# Patient Record
Sex: Male | Born: 1960 | Race: Black or African American | Hispanic: No | Marital: Married | Smoking: Former smoker
Health system: Southern US, Community
[De-identification: ages and names within clinical notes are randomized; demographics above are authoritative.]

## PROBLEM LIST (undated history)

## (undated) DIAGNOSIS — E119 Type 2 diabetes mellitus without complications: Secondary | ICD-10-CM

## (undated) DIAGNOSIS — U071 COVID-19: Secondary | ICD-10-CM

## (undated) DIAGNOSIS — T7840XA Allergy, unspecified, initial encounter: Secondary | ICD-10-CM

## (undated) DIAGNOSIS — Z86010 Personal history of colonic polyps: Secondary | ICD-10-CM

## (undated) DIAGNOSIS — F32A Depression, unspecified: Secondary | ICD-10-CM

## (undated) DIAGNOSIS — S0990XA Unspecified injury of head, initial encounter: Secondary | ICD-10-CM

## (undated) DIAGNOSIS — H16009 Unspecified corneal ulcer, unspecified eye: Secondary | ICD-10-CM

## (undated) DIAGNOSIS — G40909 Epilepsy, unspecified, not intractable, without status epilepticus: Secondary | ICD-10-CM

## (undated) DIAGNOSIS — E785 Hyperlipidemia, unspecified: Secondary | ICD-10-CM

## (undated) DIAGNOSIS — W3400XA Accidental discharge from unspecified firearms or gun, initial encounter: Secondary | ICD-10-CM

## (undated) DIAGNOSIS — B1920 Unspecified viral hepatitis C without hepatic coma: Secondary | ICD-10-CM

## (undated) DIAGNOSIS — F419 Anxiety disorder, unspecified: Secondary | ICD-10-CM

## (undated) DIAGNOSIS — I10 Essential (primary) hypertension: Secondary | ICD-10-CM

## (undated) DIAGNOSIS — F1910 Other psychoactive substance abuse, uncomplicated: Secondary | ICD-10-CM

## (undated) DIAGNOSIS — Z9049 Acquired absence of other specified parts of digestive tract: Secondary | ICD-10-CM

## (undated) HISTORY — DX: Allergy, unspecified, initial encounter: T78.40XA

## (undated) HISTORY — PX: COLONOSCOPY: SHX174

## (undated) HISTORY — DX: COVID-19: U07.1

## (undated) HISTORY — DX: Depression, unspecified: F32.A

## (undated) HISTORY — DX: Hyperlipidemia, unspecified: E78.5

## (undated) HISTORY — DX: Anxiety disorder, unspecified: F41.9

## (undated) HISTORY — DX: Other psychoactive substance abuse, uncomplicated: F19.10

## (undated) HISTORY — DX: Personal history of colonic polyps: Z86.010

## (undated) HISTORY — PX: POLYPECTOMY: SHX149

## (undated) HISTORY — DX: Unspecified corneal ulcer, unspecified eye: H16.009

## (undated) HISTORY — DX: Unspecified viral hepatitis C without hepatic coma: B19.20

## (undated) HISTORY — PX: FINGER SURGERY: SHX640

---

## 1982-04-10 HISTORY — PX: APPENDECTOMY: SHX54

## 1996-04-10 HISTORY — PX: NECK SURGERY: SHX720

## 2002-09-21 ENCOUNTER — Emergency Department (HOSPITAL_COMMUNITY): Admission: EM | Admit: 2002-09-21 | Discharge: 2002-09-21 | Payer: Self-pay | Admitting: Emergency Medicine

## 2002-09-21 ENCOUNTER — Encounter: Payer: Self-pay | Admitting: Emergency Medicine

## 2002-09-29 ENCOUNTER — Encounter: Admission: RE | Admit: 2002-09-29 | Discharge: 2002-09-29 | Payer: Self-pay | Admitting: Occupational Medicine

## 2002-09-29 ENCOUNTER — Encounter: Payer: Self-pay | Admitting: Occupational Medicine

## 2003-07-24 ENCOUNTER — Ambulatory Visit (HOSPITAL_COMMUNITY): Admission: RE | Admit: 2003-07-24 | Discharge: 2003-07-24 | Payer: Self-pay | Admitting: Family Medicine

## 2004-01-23 ENCOUNTER — Emergency Department (HOSPITAL_COMMUNITY): Admission: EM | Admit: 2004-01-23 | Discharge: 2004-01-23 | Payer: Self-pay | Admitting: *Deleted

## 2004-03-28 ENCOUNTER — Emergency Department (HOSPITAL_COMMUNITY): Admission: EM | Admit: 2004-03-28 | Discharge: 2004-03-28 | Payer: Self-pay | Admitting: Emergency Medicine

## 2004-04-16 ENCOUNTER — Emergency Department (HOSPITAL_COMMUNITY): Admission: EM | Admit: 2004-04-16 | Discharge: 2004-04-16 | Payer: Self-pay | Admitting: Emergency Medicine

## 2004-05-09 ENCOUNTER — Emergency Department (HOSPITAL_COMMUNITY): Admission: EM | Admit: 2004-05-09 | Discharge: 2004-05-09 | Payer: Self-pay | Admitting: Emergency Medicine

## 2004-05-13 ENCOUNTER — Encounter: Admission: RE | Admit: 2004-05-13 | Discharge: 2004-05-13 | Payer: Self-pay | Admitting: Emergency Medicine

## 2005-10-07 ENCOUNTER — Emergency Department (HOSPITAL_COMMUNITY): Admission: EM | Admit: 2005-10-07 | Discharge: 2005-10-07 | Payer: Self-pay | Admitting: Emergency Medicine

## 2006-09-29 ENCOUNTER — Emergency Department (HOSPITAL_COMMUNITY): Admission: EM | Admit: 2006-09-29 | Discharge: 2006-09-29 | Payer: Self-pay | Admitting: Emergency Medicine

## 2006-10-19 ENCOUNTER — Encounter: Admission: RE | Admit: 2006-10-19 | Discharge: 2006-10-19 | Payer: Self-pay | Admitting: Emergency Medicine

## 2006-11-01 ENCOUNTER — Encounter: Admission: RE | Admit: 2006-11-01 | Discharge: 2006-11-01 | Payer: Self-pay | Admitting: Emergency Medicine

## 2006-11-20 ENCOUNTER — Encounter: Admission: RE | Admit: 2006-11-20 | Discharge: 2006-11-20 | Payer: Self-pay | Admitting: Emergency Medicine

## 2006-12-28 ENCOUNTER — Encounter: Admission: RE | Admit: 2006-12-28 | Discharge: 2006-12-28 | Payer: Self-pay | Admitting: Neurosurgery

## 2007-01-07 ENCOUNTER — Ambulatory Visit (HOSPITAL_COMMUNITY): Admission: RE | Admit: 2007-01-07 | Discharge: 2007-01-08 | Payer: Self-pay | Admitting: Neurosurgery

## 2007-02-26 ENCOUNTER — Emergency Department (HOSPITAL_COMMUNITY): Admission: EM | Admit: 2007-02-26 | Discharge: 2007-02-26 | Payer: Self-pay | Admitting: Emergency Medicine

## 2007-04-11 HISTORY — PX: BACK SURGERY: SHX140

## 2008-06-09 ENCOUNTER — Ambulatory Visit: Payer: Self-pay | Admitting: Internal Medicine

## 2008-06-10 ENCOUNTER — Encounter (INDEPENDENT_AMBULATORY_CARE_PROVIDER_SITE_OTHER): Payer: Self-pay | Admitting: Internal Medicine

## 2008-06-10 LAB — CONVERTED CEMR LAB
ALT: 60 units/L — ABNORMAL HIGH (ref 0–53)
AST: 38 units/L — ABNORMAL HIGH (ref 0–37)
Albumin: 4.5 g/dL (ref 3.5–5.2)
Alkaline Phosphatase: 76 units/L (ref 39–117)
BUN: 9 mg/dL (ref 6–23)
Band Neutrophils: 0 % (ref 0–10)
Basophils Absolute: 0.1 10*3/uL (ref 0.0–0.1)
Basophils Relative: 1 % (ref 0–1)
CO2: 21 meq/L (ref 19–32)
Calcium: 9.7 mg/dL (ref 8.4–10.5)
Chloride: 102 meq/L (ref 96–112)
Creatinine, Ser: 0.86 mg/dL (ref 0.40–1.50)
Eosinophils Absolute: 5.1 10*3/uL — ABNORMAL HIGH (ref 0.0–0.7)
Eosinophils Relative: 39 % — ABNORMAL HIGH (ref 0–5)
Glucose, Bld: 125 mg/dL — ABNORMAL HIGH (ref 70–99)
HCT: 46.3 % (ref 39.0–52.0)
Hemoglobin: 16.7 g/dL (ref 13.0–17.0)
Lymphocytes Relative: 27 % (ref 12–46)
Lymphs Abs: 3.5 10*3/uL (ref 0.7–4.0)
MCHC: 36.1 g/dL — ABNORMAL HIGH (ref 30.0–36.0)
MCV: 87.2 fL (ref 78.0–100.0)
Monocytes Absolute: 0.7 10*3/uL (ref 0.1–1.0)
Monocytes Relative: 5 % (ref 3–12)
Neutro Abs: 3.5 10*3/uL (ref 1.7–7.7)
Neutrophils Relative %: 27 % — ABNORMAL LOW (ref 43–77)
Platelets: 206 10*3/uL (ref 150–400)
Potassium: 3.8 meq/L (ref 3.5–5.3)
RBC: 5.31 M/uL (ref 4.22–5.81)
RDW: 14.4 % (ref 11.5–15.5)
Sodium: 143 meq/L (ref 135–145)
TSH: 0.548 microintl units/mL (ref 0.350–4.50)
Total Bilirubin: 0.6 mg/dL (ref 0.3–1.2)
Total Protein: 7.8 g/dL (ref 6.0–8.3)
WBC: 13 10*3/uL — ABNORMAL HIGH (ref 4.0–10.5)

## 2008-06-12 ENCOUNTER — Ambulatory Visit: Payer: Self-pay | Admitting: *Deleted

## 2008-07-21 ENCOUNTER — Ambulatory Visit (HOSPITAL_COMMUNITY): Admission: RE | Admit: 2008-07-21 | Discharge: 2008-07-21 | Payer: Self-pay | Admitting: Neurosurgery

## 2008-10-27 ENCOUNTER — Ambulatory Visit: Payer: Self-pay | Admitting: Internal Medicine

## 2008-12-15 ENCOUNTER — Ambulatory Visit: Payer: Self-pay | Admitting: Internal Medicine

## 2009-01-04 ENCOUNTER — Ambulatory Visit: Payer: Self-pay | Admitting: Internal Medicine

## 2009-06-21 ENCOUNTER — Telehealth (INDEPENDENT_AMBULATORY_CARE_PROVIDER_SITE_OTHER): Payer: Self-pay | Admitting: *Deleted

## 2009-08-25 ENCOUNTER — Telehealth (INDEPENDENT_AMBULATORY_CARE_PROVIDER_SITE_OTHER): Payer: Self-pay | Admitting: *Deleted

## 2009-09-27 ENCOUNTER — Ambulatory Visit: Payer: Self-pay | Admitting: Internal Medicine

## 2009-09-27 LAB — CONVERTED CEMR LAB
Eosinophils Relative: 8 % — ABNORMAL HIGH (ref 0–5)
HCT: 46.5 % (ref 39.0–52.0)
HCV Ab: REACTIVE — AB
HDL: 58 mg/dL (ref >39)
Hep B Core Total Ab: NEGATIVE
LDL Cholesterol: 125 mg/dL — ABNORMAL HIGH (ref 0–99)
Lymphocytes Relative: 38 % (ref 12–46)
Lymphs Abs: 2.8 10*3/uL (ref 0.7–4.0)
Platelets: 213 10*3/uL (ref 150–400)
Triglycerides: 139 mg/dL (ref <150)
WBC: 7.5 10*3/uL (ref 4.0–10.5)

## 2009-11-04 ENCOUNTER — Ambulatory Visit: Payer: Self-pay | Admitting: Internal Medicine

## 2010-04-30 ENCOUNTER — Encounter: Payer: Self-pay | Admitting: Emergency Medicine

## 2010-05-10 NOTE — Progress Notes (Signed)
Summary: triage/severe eye pain  Phone Note Call from Patient   Caller: Spouse Reason for Call: Talk to Nurse Summary of Call: Patient's wife calling stating patient started having a constant severe sharp eye pain yesterday at church...and continues today.She denies he has had any trauma or any discharge from eye...denies vision changes..Patient has a history of HTN and Psoriasis. Advised wife we do not have any appointments today. Advised wife patient should go to ED.Marland KitchenMarland KitchenShe stated she would try to get him to..however wanted acute appointment tomorrow and would cancel if not used.Marland KitchenMarland KitchenAdvised wife patient could lose his vision and I cannot rule out stroke..it would be important in those cases for him to be seen immediately..She states understanding. Initial call taken by: Conchita Paris,  June 21, 2009 1:00 PM

## 2010-05-10 NOTE — Progress Notes (Signed)
Summary: triage/abd pain  Phone Note Call from Patient   Caller: Spouse Reason for Call: Talk to Nurse Summary of Call: Patients wife called from [place]...saying patient having extreme right side abd. pain..she states patient does not have appendix.  States he has been constipated because of his chronic pain medication and that he doesn't know when his last "real" bm was...she gave him laxative and he has a very small amount..she states he is doubled over in pain. She denies him having any vomiting but is only able to eat a small amount..Advised wife to take patient to nearby ED to be evaluated. Initial call taken by: Conchita Paris,  Aug 25, 2009 12:10 PM

## 2010-08-23 NOTE — Op Note (Signed)
NAME:  Cameron Diaz, Cameron Diaz                ACCOUNT NO.:  [id number]   MEDICAL RECORD NO.:  [id number]          PATIENT TYPE:  OIB   LOCATION:  3172                         FACILITY:  [facility]   PHYSICIAN:  Kathaleen Maser. Pool, M.D.    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   PROCEDURE NOTE:  Right L4-5 laminotomy and microdiskectomy.   SURGEON:  Kathaleen Maser. Pool, M.D.   ASSISTANT:  Donalee Citrin, M.D.   ANESTHESIA:  General oral endotracheal.   INDICATIONS:  The patient is a 50 year old male with history of severe  right lower extremity pain, paresthesias, weakness with right-sided L5  radiculopathy and failure of conservative care.  Workup demonstrates  evidence of a rightward L4-5 disk herniation with marked compression of  the right side L5 nerve root.  The patient presents now for laminotomy  and microdiskectomy in hopes of improving his symptoms.   DESCRIPTION OF PROCEDURE:  Patient brought to the operating room and  placed on the operating table in supine position.  After adequate level  of anesthesia was achieved, the patient positioned prone onto Wilson  frame, appropriately padded, the patient lumbar regions prepped and  draped sterilely.  A 10-blade was used to make a linear skin incision  overlying the L4-5 interspace.  This was carried down sharply in  midline.  A subperiosteal dissection was then performed exposing the  lamina of facet joints at L4 and L5 on the right side.  Deep self-  retaining retractor was placed.  Intraoperative x-rays taken and level  was confirmed.  Laminotomy was then performed using high-speed drill and  Kerrison rongeurs to remove the inferior aspect of the lamina of L4,  medial aspect of the L4-5 facet  joint and the superior rim of the L5  lamina.  Ligament flavum was elevated and resected in piecemeal fashion  using Kerrison rongeurs.  Underlying thecal sac and exiting L5 nerve  root were identified.  Microscope was brought into the field and used  for microdissection of the right side L5 nerve root and underlying disk  herniation, epidural venous plexus coagulated and cut.  Thecal sac and  L5 nerve root were gently mobilized, retracted towards the midline.  A  free inferior fragment of disk herniation was encountered.  This was  dissected free and removed using pituitary rongeurs.  Disk space incised  with a 15 blade in rectangular fashion.  A wide disk space clean-out was  then achieved using pituitary rongeurs, upward angle pituitary rongeurs  and Epstein curettes.  All loose or obviously degenerative disk material  was removed from the interspace.  All elements of the disk herniation  was completely resected.  At this point, a very thorough decompression  had been achieved.  Wound was then irrigated with antibiotic solution.  Gelfoam was placed topically for hemostasis and found to be good.  Microscope  and retractor  system were removed.  Hemostasis of the muscles achieved with  electrocautery.  Wounds were closed in layers with Vicryl sutures.  Steri-Strips and sterile dressing were applied.  There were no apparent  complications.  Patient tolerated the procedure well and he returns to  the recovery room postoperatively.           ______________________________  Kathaleen Maser Pool, M.D.     HAP/MEDQ  D:  01/07/2007  T:  01/07/2007  Job:  [id number]

## 2010-09-01 ENCOUNTER — Emergency Department (HOSPITAL_COMMUNITY): Payer: Self-pay

## 2010-09-01 ENCOUNTER — Inpatient Hospital Stay (HOSPITAL_COMMUNITY)
Admission: EM | Admit: 2010-09-01 | Discharge: 2010-09-02 | DRG: 305 | Disposition: A | Discharge status: Home / Self Care | Payer: Self-pay | Source: Ambulatory Visit | Attending: Internal Medicine | Admitting: Internal Medicine

## 2010-09-01 ENCOUNTER — Encounter (HOSPITAL_COMMUNITY): Payer: Self-pay | Admitting: Radiology

## 2010-09-01 DIAGNOSIS — E871 Hypo-osmolality and hyponatremia: Secondary | ICD-10-CM | POA: Diagnosis present

## 2010-09-01 DIAGNOSIS — R51 Headache: Secondary | ICD-10-CM | POA: Diagnosis present

## 2010-09-01 DIAGNOSIS — I10 Essential (primary) hypertension: Principal | ICD-10-CM | POA: Diagnosis present

## 2010-09-01 DIAGNOSIS — G40909 Epilepsy, unspecified, not intractable, without status epilepticus: Secondary | ICD-10-CM | POA: Diagnosis present

## 2010-09-01 DIAGNOSIS — E876 Hypokalemia: Secondary | ICD-10-CM | POA: Diagnosis present

## 2010-09-01 DIAGNOSIS — Z91199 Patient's noncompliance with other medical treatment and regimen due to unspecified reason: Secondary | ICD-10-CM

## 2010-09-01 DIAGNOSIS — Z6835 Body mass index (BMI) 35.0-35.9, adult: Secondary | ICD-10-CM

## 2010-09-01 DIAGNOSIS — D72829 Elevated white blood cell count, unspecified: Secondary | ICD-10-CM | POA: Diagnosis present

## 2010-09-01 DIAGNOSIS — Z9119 Patient's noncompliance with other medical treatment and regimen: Secondary | ICD-10-CM

## 2010-09-01 DIAGNOSIS — R112 Nausea with vomiting, unspecified: Secondary | ICD-10-CM | POA: Diagnosis present

## 2010-09-01 HISTORY — DX: Unspecified injury of head, initial encounter: S09.90XA

## 2010-09-01 HISTORY — DX: Accidental discharge from unspecified firearms or gun, initial encounter: W34.00XA

## 2010-09-01 HISTORY — DX: Essential (primary) hypertension: I10

## 2010-09-01 HISTORY — DX: Epilepsy, unspecified, not intractable, without status epilepticus: G40.909

## 2010-09-01 HISTORY — DX: Acquired absence of other specified parts of digestive tract: Z90.49

## 2010-09-01 LAB — HEMOGLOBIN A1C: Mean Plasma Glucose: 114 mg/dL (ref <117)

## 2010-09-01 LAB — CBC
Hemoglobin: 18.1 g/dL — ABNORMAL HIGH (ref 13.0–17.0)
MCH: 32 pg (ref 26.0–34.0)
MCV: 84.5 fL (ref 78.0–100.0)
RBC: 5.66 MIL/uL (ref 4.22–5.81)

## 2010-09-01 LAB — DIFFERENTIAL
Basophils Relative: 2 % — ABNORMAL HIGH (ref 0–1)
Lymphocytes Relative: 21 % (ref 12–46)
Monocytes Relative: 3 % (ref 3–12)
Neutro Abs: 6.5 10*3/uL (ref 1.7–7.7)

## 2010-09-01 LAB — BASIC METABOLIC PANEL
CO2: 27 mEq/L (ref 19–32)
Chloride: 99 mEq/L (ref 96–112)
GFR calc Af Amer: >60 mL/min (ref >60)
Potassium: 4.8 mEq/L (ref 3.5–5.1)
Sodium: 134 mEq/L — ABNORMAL LOW (ref 135–145)

## 2010-09-02 LAB — COMPREHENSIVE METABOLIC PANEL
ALT: 41 U/L (ref 0–53)
Albumin: 3.6 g/dL (ref 3.5–5.2)
Alkaline Phosphatase: 100 U/L (ref 39–117)
Chloride: 102 mEq/L (ref 96–112)
Glucose, Bld: 106 mg/dL — ABNORMAL HIGH (ref 70–99)
Potassium: 3.1 mEq/L — ABNORMAL LOW (ref 3.5–5.1)
Sodium: 139 mEq/L (ref 135–145)
Total Bilirubin: 0.7 mg/dL (ref 0.3–1.2)
Total Protein: 7.5 g/dL (ref 6.0–8.3)

## 2010-09-02 LAB — CBC
HCT: 44.1 % (ref 39.0–52.0)
MCV: 84.6 fL (ref 78.0–100.0)
Platelets: 190 10*3/uL (ref 150–400)
RBC: 5.21 MIL/uL (ref 4.22–5.81)
RDW: 13.1 % (ref 11.5–15.5)
WBC: 14.5 10*3/uL — ABNORMAL HIGH (ref 4.0–10.5)

## 2010-09-03 NOTE — Discharge Summary (Signed)
Cameron Diaz, Cameron Diaz                ACCOUNT NO.:  [id number]  MEDICAL RECORD NO.:  [id number]           PATIENT TYPE:  O  LOCATION:  2011                         FACILITY:  [facility]  PHYSICIAN:  Conley Canal, MD      DATE OF BIRTH:  02-Jan-1961  DATE OF ADMISSION:  09/01/2010 DATE OF DISCHARGE:  09/02/2010                        DISCHARGE SUMMARY - REFERRING   PRIMARY CARE PROVIDER:  Dr. Reche Dixon with [facility].  DISCHARGE DIAGNOSES: 1. Accelerated malignant hypertension. 2. Nausea, vomiting, leukocytosis, headache concerning for possible     abdominal versus lower back versus meningeal infection versus     genitourinary infection. 3. Morbid obesity, BMI 35. 4. Apparent noncompliant with medications. 5. Hypokalemia. 6. An episode of seizure.  DISCHARGE MEDICATIONS: 1. Amlodipine 10 mg daily. 2. Lisinopril 20 mg daily. 3. Diazepam 5 mg twice daily as needed. 4. Percocet 5/325 mg every 6 hours as needed.  No new prescription given.  PROCEDURES PERFORMED: 1. Chest x-ray on Sep 01, 2010, showed no acute cardiopulmonary     findings. 2. CT of the brain without contrast on Sep 01, 2010, showed no acute     intracranial abnormality.  HOSPITAL COURSE:  Cameron Diaz was admitted on Sep 01, 2010, with complaints of headache, nausea, vomiting.  He also admitted to me that he had some subjective fever and chills as well as photophobia.  He said he had run out of his blood pressure medications and when he was first seen in the emergency room, his blood pressure was 172/111 hence raising concern for accelerated hypertension.  His medications were restarted and the blood pressure responded pretty quickly and today is 138/91.  At this point, I am very concerned about some possible infection including gastrointestinal, genitourinary versus meningeal and lower back infection given the leukocytosis and the nausea and vomiting.  The issues that the blood pressure does not seem that elevated to our  count for this symptoms, so he could probably have some infection brewing somewhere and the elevated blood pressure could just be a red herring and I offered further workup to the patient to try to identify the possible source of infection but at this point, the patient is adamant that he needs to be back at work.  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  I have extensively discussed the risk of infection not being treated including dead, sepsis, but at this point, he would rather take the risk.  I have emphasized to him that he should follow up with his doctor as soon as possible, possibly in the next 3 days after he done with the tournament if he does not develop further problems in this time.  He expresses good understanding and realizes that this needs to be addressed and hence, I am going to discharge him today.  He hinted to me that he cannot afford the blood pressure medications until his pay day, and I have referred him for our prescription support program, so that he can hopefully get a few days of medications to he gets paid.  Otherwise his labs today  include a white count of 14.5, hemoglobin 16.8, hematocrit 44.1, platelet count 190.  Sodium 139, potassium 3.1, BUN 8, creatinine 0.79.  He should follow with Dr. Reche Dixon as outlined earlier. The patient is not on medication.  Time spent for this discharge preparation was 35 minutes.     Conley Canal, MD     SR/MEDQ  D:  09/02/2010  T:  09/02/2010  Job:  [id number]  cc:   Dr. Reche Dixon  Electronically Signed by Conley Canal  on 09/03/2010 03:53:52 PM
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
## 2011-01-17 LAB — DIFFERENTIAL
Basophils Relative: 1
Eosinophils Relative: 37 — ABNORMAL HIGH
Lymphs Abs: 3
Monocytes Relative: 11
Neutro Abs: 5.5

## 2011-01-17 LAB — COMPREHENSIVE METABOLIC PANEL
AST: 56 — ABNORMAL HIGH
Albumin: 3.9
Calcium: 9.7
Creatinine, Ser: 1.01
GFR calc Af Amer: >60
GFR calc non Af Amer: >60
Sodium: 139
Total Protein: 7.9

## 2011-01-17 LAB — CBC
MCHC: 35
MCV: 93
Platelets: 212

## 2011-01-17 LAB — POCT CARDIAC MARKERS
CKMB, poc: <1 — ABNORMAL LOW
Myoglobin, poc: 53.2
Operator id: 133351
Operator id: 198171

## 2011-01-19 LAB — CBC
HCT: 47.6
Hemoglobin: 16.6
MCHC: 34.8
MCV: 93.9
Platelets: 255
RBC: 5.07
RDW: 13.7
WBC: 11.2 — ABNORMAL HIGH

## 2011-01-19 LAB — BASIC METABOLIC PANEL
Calcium: 10
Chloride: 105
Creatinine, Ser: 0.93
GFR calc Af Amer: >60

## 2011-01-19 LAB — DIFFERENTIAL
Lymphs Abs: 2.2
Monocytes Relative: 7
Neutro Abs: 7.8 — ABNORMAL HIGH
Neutrophils Relative %: 70

## 2011-01-19 LAB — ABO/RH: ABO/RH(D): O POS

## 2011-01-19 LAB — TYPE AND SCREEN: ABO/RH(D): O POS

## 2011-07-17 ENCOUNTER — Ambulatory Visit: Payer: Medicare Other | Attending: Orthopaedic Surgery | Admitting: Physical Therapy

## 2011-07-17 DIAGNOSIS — IMO0001 Reserved for inherently not codable concepts without codable children: Secondary | ICD-10-CM | POA: Insufficient documentation

## 2011-07-17 DIAGNOSIS — M25569 Pain in unspecified knee: Secondary | ICD-10-CM | POA: Insufficient documentation

## 2011-07-17 DIAGNOSIS — R262 Difficulty in walking, not elsewhere classified: Secondary | ICD-10-CM | POA: Insufficient documentation

## 2011-07-17 DIAGNOSIS — M25669 Stiffness of unspecified knee, not elsewhere classified: Secondary | ICD-10-CM | POA: Insufficient documentation

## 2011-07-17 DIAGNOSIS — M6281 Muscle weakness (generalized): Secondary | ICD-10-CM | POA: Insufficient documentation

## 2011-07-19 ENCOUNTER — Ambulatory Visit: Payer: Medicare Other | Admitting: Physical Therapy

## 2011-07-26 ENCOUNTER — Encounter: Payer: Medicare Other | Admitting: Physical Therapy

## 2011-07-26 ENCOUNTER — Ambulatory Visit: Payer: Medicare Other | Admitting: Physical Therapy

## 2011-07-31 ENCOUNTER — Encounter: Payer: Medicare Other | Admitting: Physical Therapy

## 2011-07-31 ENCOUNTER — Ambulatory Visit: Payer: Medicare Other | Admitting: Physical Therapy

## 2011-08-03 ENCOUNTER — Ambulatory Visit: Payer: Medicare Other | Admitting: Physical Therapy

## 2011-08-07 ENCOUNTER — Ambulatory Visit: Payer: Medicare Other | Admitting: Physical Therapy

## 2011-08-09 ENCOUNTER — Encounter: Payer: Medicare Other | Admitting: Physical Therapy

## 2011-09-11 DIAGNOSIS — Z860100 Personal history of colon polyps, unspecified: Secondary | ICD-10-CM

## 2011-09-11 HISTORY — DX: Personal history of colonic polyps: Z86.010

## 2011-09-11 HISTORY — DX: Personal history of colon polyps, unspecified: Z86.0100

## 2012-09-17 ENCOUNTER — Encounter (HOSPITAL_COMMUNITY): Payer: Self-pay | Admitting: Emergency Medicine

## 2012-09-17 ENCOUNTER — Emergency Department (HOSPITAL_COMMUNITY): Payer: Medicare Other

## 2012-09-17 ENCOUNTER — Emergency Department (HOSPITAL_COMMUNITY)
Admission: EM | Admit: 2012-09-17 | Discharge: 2012-09-17 | Disposition: A | Discharge status: Home / Self Care | Payer: Medicare Other | Attending: Emergency Medicine | Admitting: Emergency Medicine

## 2012-09-17 DIAGNOSIS — G510 Bell's palsy: Secondary | ICD-10-CM | POA: Insufficient documentation

## 2012-09-17 DIAGNOSIS — Z8669 Personal history of other diseases of the nervous system and sense organs: Secondary | ICD-10-CM | POA: Insufficient documentation

## 2012-09-17 DIAGNOSIS — Z9889 Other specified postprocedural states: Secondary | ICD-10-CM | POA: Insufficient documentation

## 2012-09-17 DIAGNOSIS — Z87828 Personal history of other (healed) physical injury and trauma: Secondary | ICD-10-CM | POA: Insufficient documentation

## 2012-09-17 DIAGNOSIS — I10 Essential (primary) hypertension: Secondary | ICD-10-CM | POA: Insufficient documentation

## 2012-09-17 DIAGNOSIS — E119 Type 2 diabetes mellitus without complications: Secondary | ICD-10-CM | POA: Insufficient documentation

## 2012-09-17 HISTORY — DX: Type 2 diabetes mellitus without complications: E11.9

## 2012-09-17 LAB — BASIC METABOLIC PANEL
BUN: 13 mg/dL (ref 6–23)
Calcium: 9.9 mg/dL (ref 8.4–10.5)
GFR calc non Af Amer: >90 mL/min (ref >90)
Glucose, Bld: 99 mg/dL (ref 70–99)

## 2012-09-17 LAB — CBC WITH DIFFERENTIAL/PLATELET
Eosinophils Absolute: 0.3 10*3/uL (ref 0.0–0.7)
Eosinophils Relative: 3 % (ref 0–5)
HCT: 42 % (ref 39.0–52.0)
Hemoglobin: 15.8 g/dL (ref 13.0–17.0)
Lymphs Abs: 2.7 10*3/uL (ref 0.7–4.0)
MCH: 31.7 pg (ref 26.0–34.0)
MCV: 84.3 fL (ref 78.0–100.0)
Monocytes Relative: 11 % (ref 3–12)
RBC: 4.98 MIL/uL (ref 4.22–5.81)

## 2012-09-17 MED ORDER — PREDNISONE 10 MG PO TABS: ORAL_TABLET | ORAL | Status: DC

## 2012-09-17 MED ORDER — PREDNISONE 20 MG PO TABS
60.0000 mg | ORAL_TABLET | Freq: Once | ORAL | Status: AC
  Administered 2012-09-17: 60 mg via ORAL
  Filled 2012-09-17: qty 3

## 2012-09-17 MED ORDER — ACYCLOVIR 400 MG PO TABS: 400.0000 mg | ORAL_TABLET | Freq: Every day | ORAL | Status: DC

## 2012-09-17 NOTE — ED Notes (Signed)
Pt c/o of pain in back of head that started Saturday. States that pain radiates down neck into jaw. Also c/o of dizziness. NAD at this time.
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
## 2012-09-17 NOTE — ED Notes (Signed)
Pt escorted to discharge window. Pt verbalized understanding discharge instructions. In no acute distress.  

## 2013-10-14 ENCOUNTER — Ambulatory Visit (INDEPENDENT_AMBULATORY_CARE_PROVIDER_SITE_OTHER): Payer: Commercial Managed Care - HMO

## 2013-10-14 VITALS — BP 91/59 | HR 92 | Resp 16 | Ht 70.0 in | Wt 209.0 lb

## 2013-10-14 DIAGNOSIS — E1149 Type 2 diabetes mellitus with other diabetic neurological complication: Secondary | ICD-10-CM

## 2013-10-14 DIAGNOSIS — Q828 Other specified congenital malformations of skin: Secondary | ICD-10-CM

## 2013-10-14 DIAGNOSIS — M79606 Pain in leg, unspecified: Secondary | ICD-10-CM

## 2013-10-14 DIAGNOSIS — M79609 Pain in unspecified limb: Secondary | ICD-10-CM

## 2013-10-14 DIAGNOSIS — G609 Hereditary and idiopathic neuropathy, unspecified: Secondary | ICD-10-CM

## 2013-10-14 DIAGNOSIS — G629 Polyneuropathy, unspecified: Secondary | ICD-10-CM

## 2013-10-14 DIAGNOSIS — E1140 Type 2 diabetes mellitus with diabetic neuropathy, unspecified: Secondary | ICD-10-CM

## 2013-10-14 DIAGNOSIS — B070 Plantar wart: Secondary | ICD-10-CM

## 2013-10-14 DIAGNOSIS — B351 Tinea unguium: Secondary | ICD-10-CM

## 2013-10-14 DIAGNOSIS — E1142 Type 2 diabetes mellitus with diabetic polyneuropathy: Secondary | ICD-10-CM

## 2013-10-14 NOTE — Patient Instructions (Signed)
Diabetes and Foot Care Diabetes may cause you to have problems because of poor blood supply (circulation) to your feet and legs. This may cause the skin on your feet to become thinner, break easier, and heal more slowly. Your skin may become dry, and the skin may peel and crack. You may also have nerve damage in your legs and feet causing decreased feeling in them. You may not notice minor injuries to your feet that could lead to infections or more serious problems. Taking care of your feet is one of the most important things you can do for yourself.  HOME CARE INSTRUCTIONS  Wear shoes at all times, even in the house. Do not go barefoot. Bare feet are easily injured.  Check your feet daily for blisters, cuts, and redness. If you cannot see the bottom of your feet, use a mirror or ask someone for help.  Wash your feet with warm water (do not use hot water) and mild soap. Then pat your feet and the areas between your toes until they are completely dry. Do not soak your feet as this can dry your skin.  Apply a moisturizing lotion or petroleum jelly (that does not contain alcohol and is unscented) to the skin on your feet and to dry, brittle toenails. Do not apply lotion between your toes.  Trim your toenails straight across. Do not dig under them or around the cuticle. File the edges of your nails with an emery board or nail file.  Do not cut corns or calluses or try to remove them with medicine.  Wear clean socks or stockings every day. Make sure they are not too tight. Do not wear knee-high stockings since they may decrease blood flow to your legs.  Wear shoes that fit properly and have enough cushioning. To break in new shoes, wear them for just a few hours a day. This prevents you from injuring your feet. Always look in your shoes before you put them on to be sure there are no objects inside.  Do not cross your legs. This may decrease the blood flow to your feet.  If you find a minor scrape,  cut, or break in the skin on your feet, keep it and the skin around it clean and dry. These areas may be cleansed with mild soap and water. Do not cleanse the area with peroxide, alcohol, or iodine.  When you remove an adhesive bandage, be sure not to damage the skin around it.  If you have a wound, look at it several times a day to make sure it is healing.  Do not use heating pads or hot water bottles. They may burn your skin. If you have lost feeling in your feet or legs, you may not know it is happening until it is too late.  Make sure your health care provider performs a complete foot exam at least annually or more often if you have foot problems. Report any cuts, sores, or bruises to your health care provider immediately. SEEK MEDICAL CARE IF:   You have an injury that is not healing.  You have cuts or breaks in the skin.  You have an ingrown nail.  You notice redness on your legs or feet.  You feel burning or tingling in your legs or feet.  You have pain or cramps in your legs and feet.  Your legs or feet are numb.  Your feet always feel cold. SEEK IMMEDIATE MEDICAL CARE IF:   There is increasing redness,   swelling, or pain in or around a wound.  There is a red line that goes up your leg.  Pus is coming from a wound.  You develop a fever or as directed by your health care provider.  You notice a bad smell coming from an ulcer or wound. Document Released: 03/24/2000 Document Revised: 11/27/2012 Document Reviewed: 09/03/2012 [employer] Patient Information 2015 [employer]. This information is not intended to replace advice given to you by your health care provider. Make sure you discuss any questions you have with your health care provider.  

## 2013-10-14 NOTE — Progress Notes (Signed)
   Subjective:    Patient ID: Cameron Diaz, male    DOB: [birth year], 53 y.o.   MRN: [id number]  HPI Comments: i need my toenails trimmed and i have sores on both of my feet. The left one under the little toe and on the heel on my rt foot. My toenails do not hurt. The places on my feet hurt. This has been going on for 2 - 3 yrs. The right one is getting worse. The left has remained the same. i trim my toenails and callus. i have had pedicures as well.     Review of Systems  Constitutional: Positive for unexpected weight change.  HENT:       Ringing in ears   Musculoskeletal:       Back pain Difficulty walking  Neurological: Positive for light-headedness.  All other systems reviewed and are negative.      Objective:   Physical Exam 53 year old [place] American male presents this time well-developed well-nourished oriented x3. Patient has a history of the nail care and multiple keratoses which are painful and symptomatic and plantar aspects of both feet. There is keratoses sub-fifth bilateral are most significant inferior mid arch area of the left foot. Patient is a history peripheral neuropathy and some lumbar radiculopathy issues which are being managed. Lower extremity objective findings as follows vascular status appears to be intact pedal pulses palpable DP +2/4 bilateral PT plus one over 4 bilateral capillary refill time 3 seconds all digits skin temperature is warm turgor normal no edema rubor pallor or varicosities noted. Neurologically epicritic and proprioceptive sensations intact. There is normal plantar response DTRs not elicited there is decreased sensation distal toes and inferior heel bilateral on Semmes Weinstein testing. Orthopedic biomechanical exam reveals rectus foot type mild flexible digital contractures noted no other osseous abnormalities identified hallux is rectus as her digits dermatologically nails criptotic slightly incurvated on some yellowing discoloration  otherwise unremarkable is mostly T. keratotic lesion sub-5 bilateral inferior heel arch area on the right will decrease her x-ray report keratosis or verruca in nature.       Assessment & Plan:  Assessment this time his diabetes with history peripheral neuropathy with associated with diabetes and possibly lumbar radiculopathy. Plan at this time debridement of nails the presence of diabetes and complications as well as onychomycosis and proptosis of nails mycotic nails painful symptomatically 1 through 5 bilateral also debridement of keratotic lesions multiple spots both feet the lesion inferior heel and arch area is dressed and packed with 67% salicylic acid for 24 hours patient will removed and cleansed thoroughly recommended lotion to the feet daily using pumice stone for the keratosis and dry skin and the heel and arch area recommended lotion to his legs a daily basis as well recommend socks and shoes at all times no barefoot or flimsy shoes or flip-flops diabetic foot care instructions dispensed to the patient recheck in 3 months for continued palliative care in the future as needed next  [employer] DPM

## 2014-01-20 ENCOUNTER — Ambulatory Visit (INDEPENDENT_AMBULATORY_CARE_PROVIDER_SITE_OTHER): Payer: Commercial Managed Care - HMO

## 2014-01-20 DIAGNOSIS — B351 Tinea unguium: Secondary | ICD-10-CM

## 2014-01-20 DIAGNOSIS — G629 Polyneuropathy, unspecified: Secondary | ICD-10-CM

## 2014-01-20 DIAGNOSIS — M79606 Pain in leg, unspecified: Secondary | ICD-10-CM

## 2014-01-20 DIAGNOSIS — Q828 Other specified congenital malformations of skin: Secondary | ICD-10-CM

## 2014-01-20 DIAGNOSIS — E1140 Type 2 diabetes mellitus with diabetic neuropathy, unspecified: Secondary | ICD-10-CM

## 2014-01-20 NOTE — Patient Instructions (Signed)
Diabetes and Foot Care Diabetes may cause you to have problems because of poor blood supply (circulation) to your feet and legs. This may cause the skin on your feet to become thinner, break easier, and heal more slowly. Your skin may become dry, and the skin may peel and crack. You may also have nerve damage in your legs and feet causing decreased feeling in them. You may not notice minor injuries to your feet that could lead to infections or more serious problems. Taking care of your feet is one of the most important things you can do for yourself.  HOME CARE INSTRUCTIONS  Wear shoes at all times, even in the house. Do not go barefoot. Bare feet are easily injured.  Check your feet daily for blisters, cuts, and redness. If you cannot see the bottom of your feet, use a mirror or ask someone for help.  Wash your feet with warm water (do not use hot water) and mild soap. Then pat your feet and the areas between your toes until they are completely dry. Do not soak your feet as this can dry your skin.  Apply a moisturizing lotion or petroleum jelly (that does not contain alcohol and is unscented) to the skin on your feet and to dry, brittle toenails. Do not apply lotion between your toes.  Trim your toenails straight across. Do not dig under them or around the cuticle. File the edges of your nails with an emery board or nail file.  Do not cut corns or calluses or try to remove them with medicine.  Wear clean socks or stockings every day. Make sure they are not too tight. Do not wear knee-high stockings since they may decrease blood flow to your legs.  Wear shoes that fit properly and have enough cushioning. To break in new shoes, wear them for just a few hours a day. This prevents you from injuring your feet. Always look in your shoes before you put them on to be sure there are no objects inside.  Do not cross your legs. This may decrease the blood flow to your feet.  If you find a minor scrape,  cut, or break in the skin on your feet, keep it and the skin around it clean and dry. These areas may be cleansed with mild soap and water. Do not cleanse the area with peroxide, alcohol, or iodine.  When you remove an adhesive bandage, be sure not to damage the skin around it.  If you have a wound, look at it several times a day to make sure it is healing.  Do not use heating pads or hot water bottles. They may burn your skin. If you have lost feeling in your feet or legs, you may not know it is happening until it is too late.  Make sure your health care provider performs a complete foot exam at least annually or more often if you have foot problems. Report any cuts, sores, or bruises to your health care provider immediately. SEEK MEDICAL CARE IF:   You have an injury that is not healing.  You have cuts or breaks in the skin.  You have an ingrown nail.  You notice redness on your legs or feet.  You feel burning or tingling in your legs or feet.  You have pain or cramps in your legs and feet.  Your legs or feet are numb.  Your feet always feel cold. SEEK IMMEDIATE MEDICAL CARE IF:   There is increasing redness,   swelling, or pain in or around a wound.  There is a red line that goes up your leg.  Pus is coming from a wound.  You develop a fever or as directed by your health care provider.  You notice a bad smell coming from an ulcer or wound. Document Released: 03/24/2000 Document Revised: 11/27/2012 Document Reviewed: 09/03/2012 [employer] Patient Information 2015 [employer]. This information is not intended to replace advice given to you by your health care provider. Make sure you discuss any questions you have with your health care provider.  

## 2014-01-20 NOTE — Progress Notes (Signed)
   Subjective:    Patient ID: Cameron Diaz, male    DOB: 16-May-1960, 53 y.o.   MRN: [id number]  HPI  Pt presents for nail debridement  Review of Systems no new findings or systemic changes noted    Objective:   Physical Exam Vascular status is intact and unchanged PT pulse +1 DP +2/4 bilateral Refill time 3 seconds all digits epicritic and proprioceptive sensations decreased on Semmes Weinstein to forefoot arch and heel or does have multiple keratoses sub-5 bilateral in inferior heel on the right. The keratotic lesions consistent with porokeratosis versus verruca. No open wounds no ulcers no secondary infections. Patient is incision diabetic shoes which may help alleviate the pressure areas keratoses due to digital contractures and plantar flexed metatarsals. Nails thick brittle crumbly friable dystrophic with discoloration tenderness both on palpation and with ambulation.       Assessment & Plan:  Assessment diabetes with history peripheral neuropathy and angiopathy mycotic nails painful friable dystrophic read 1 through 5 bilateral. Also multiple keratoses sub-5 bilateral inferior right heel debridement this time obtain authorization for diabetic shoes followup in 3 months for palliative care is needed call patient authorization for shoes obtained for measurement and fitting  Harriet Masson DPM

## 2014-04-28 ENCOUNTER — Ambulatory Visit: Payer: Commercial Managed Care - HMO

## 2014-05-12 ENCOUNTER — Ambulatory Visit: Payer: Commercial Managed Care - HMO

## 2014-07-21 ENCOUNTER — Emergency Department (INDEPENDENT_AMBULATORY_CARE_PROVIDER_SITE_OTHER): Payer: Medicare Other

## 2014-07-21 ENCOUNTER — Emergency Department (HOSPITAL_COMMUNITY)
Admission: EM | Admit: 2014-07-21 | Discharge: 2014-07-21 | Disposition: A | Discharge status: Home / Self Care | Payer: Medicare Other

## 2014-07-21 ENCOUNTER — Encounter (HOSPITAL_COMMUNITY): Payer: Self-pay | Admitting: Emergency Medicine

## 2014-07-21 ENCOUNTER — Emergency Department (INDEPENDENT_AMBULATORY_CARE_PROVIDER_SITE_OTHER)
Admission: EM | Admit: 2014-07-21 | Discharge: 2014-07-21 | Disposition: A | Discharge status: Nursing Home (Includes ICF) | Payer: Medicare Other | Source: Home / Self Care | Attending: Family Medicine | Admitting: Family Medicine

## 2014-07-21 DIAGNOSIS — R1032 Left lower quadrant pain: Secondary | ICD-10-CM

## 2014-07-21 DIAGNOSIS — R109 Unspecified abdominal pain: Secondary | ICD-10-CM | POA: Diagnosis not present

## 2014-07-21 LAB — POCT URINALYSIS DIP (DEVICE)
Bilirubin Urine: NEGATIVE
Glucose, UA: NEGATIVE mg/dL
Hgb urine dipstick: NEGATIVE
KETONES UR: NEGATIVE mg/dL
Leukocytes, UA: NEGATIVE
Nitrite: NEGATIVE
Protein, ur: NEGATIVE mg/dL
Specific Gravity, Urine: 1.01 (ref 1.005–1.030)
Urobilinogen, UA: 0.2 mg/dL (ref 0.0–1.0)
pH: 7 (ref 5.0–8.0)

## 2014-07-21 LAB — CBC WITH DIFFERENTIAL/PLATELET
BASOS ABS: 0.1 10*3/uL (ref 0.0–0.1)
Basophils Relative: 1 % (ref 0–1)
EOS ABS: 0.3 10*3/uL (ref 0.0–0.7)
Eosinophils Relative: 3 % (ref 0–5)
HCT: 43.3 % (ref 39.0–52.0)
Hemoglobin: 15.9 g/dL (ref 13.0–17.0)
Lymphocytes Relative: 36 % (ref 12–46)
Lymphs Abs: 2.7 10*3/uL (ref 0.7–4.0)
MCH: 31.1 pg (ref 26.0–34.0)
MCHC: 36.7 g/dL — ABNORMAL HIGH (ref 30.0–36.0)
MCV: 84.6 fL (ref 78.0–100.0)
MONO ABS: 0.6 10*3/uL (ref 0.1–1.0)
Monocytes Relative: 8 % (ref 3–12)
Neutro Abs: 3.9 10*3/uL (ref 1.7–7.7)
Neutrophils Relative %: 52 % (ref 43–77)
PLATELETS: 226 10*3/uL (ref 150–400)
RBC: 5.12 MIL/uL (ref 4.22–5.81)
RDW: 13.3 % (ref 11.5–15.5)
WBC: 7.6 10*3/uL (ref 4.0–10.5)

## 2014-07-21 LAB — POCT I-STAT, CHEM 8
BUN: 7 mg/dL (ref 6–23)
CALCIUM ION: 1.27 mmol/L — AB (ref 1.12–1.23)
CREATININE: 0.9 mg/dL (ref 0.50–1.35)
Chloride: 105 mmol/L (ref 96–112)
Glucose, Bld: 108 mg/dL — ABNORMAL HIGH (ref 70–99)
HCT: 49 % (ref 39.0–52.0)
Hemoglobin: 16.7 g/dL (ref 13.0–17.0)
Potassium: 4 mmol/L (ref 3.5–5.1)
Sodium: 141 mmol/L (ref 135–145)
TCO2: 24 mmol/L (ref 0–100)
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
## 2014-07-21 NOTE — ED Notes (Signed)
Pt being sent to ED via shuttle for LLQ pain.  Report called to Cecille Rubin, RN First Nurse ED.
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
## 2014-07-21 NOTE — ED Notes (Addendum)
Pt. Had to leave to go take care of his dogs.  He will be back in 2 hours.  Encouraged pt. To stay , he was unable to.

## 2014-07-22 ENCOUNTER — Encounter (HOSPITAL_COMMUNITY): Payer: Self-pay

## 2014-07-22 ENCOUNTER — Emergency Department (HOSPITAL_COMMUNITY)
Admission: EM | Admit: 2014-07-22 | Discharge: 2014-07-23 | Disposition: A | Discharge status: Home / Self Care | Payer: Medicare Other | Attending: Emergency Medicine | Admitting: Emergency Medicine

## 2014-07-22 ENCOUNTER — Emergency Department (HOSPITAL_COMMUNITY): Payer: Medicare Other

## 2014-07-22 DIAGNOSIS — Z79899 Other long term (current) drug therapy: Secondary | ICD-10-CM | POA: Insufficient documentation

## 2014-07-22 DIAGNOSIS — K5793 Diverticulitis of intestine, part unspecified, without perforation or abscess with bleeding: Secondary | ICD-10-CM | POA: Diagnosis not present

## 2014-07-22 DIAGNOSIS — E119 Type 2 diabetes mellitus without complications: Secondary | ICD-10-CM | POA: Diagnosis not present

## 2014-07-22 DIAGNOSIS — Z87828 Personal history of other (healed) physical injury and trauma: Secondary | ICD-10-CM | POA: Diagnosis not present

## 2014-07-22 DIAGNOSIS — Z720 Tobacco use: Secondary | ICD-10-CM | POA: Insufficient documentation

## 2014-07-22 DIAGNOSIS — K5900 Constipation, unspecified: Secondary | ICD-10-CM | POA: Diagnosis not present

## 2014-07-22 DIAGNOSIS — R1032 Left lower quadrant pain: Secondary | ICD-10-CM

## 2014-07-22 DIAGNOSIS — I10 Essential (primary) hypertension: Secondary | ICD-10-CM | POA: Diagnosis not present

## 2014-07-22 DIAGNOSIS — K5730 Diverticulosis of large intestine without perforation or abscess without bleeding: Secondary | ICD-10-CM

## 2014-07-22 LAB — URINALYSIS, ROUTINE W REFLEX MICROSCOPIC
Bilirubin Urine: NEGATIVE
Glucose, UA: NEGATIVE mg/dL
HGB URINE DIPSTICK: NEGATIVE
Ketones, ur: NEGATIVE mg/dL
Leukocytes, UA: NEGATIVE
Nitrite: NEGATIVE
PROTEIN: NEGATIVE mg/dL
Specific Gravity, Urine: 1.018 (ref 1.005–1.030)
Urobilinogen, UA: 1 mg/dL (ref 0.0–1.0)
pH: 7 (ref 5.0–8.0)

## 2014-07-22 LAB — COMPREHENSIVE METABOLIC PANEL
ALT: 42 U/L (ref 0–53)
ANION GAP: 7 (ref 5–15)
AST: 38 U/L — AB (ref 0–37)
Albumin: 4.2 g/dL (ref 3.5–5.2)
Alkaline Phosphatase: 67 U/L (ref 39–117)
BUN: 12 mg/dL (ref 6–23)
CO2: 26 mmol/L (ref 19–32)
CREATININE: 0.96 mg/dL (ref 0.50–1.35)
Calcium: 9.4 mg/dL (ref 8.4–10.5)
Chloride: 105 mmol/L (ref 96–112)
GFR calc Af Amer: >90 mL/min (ref >90)
Glucose, Bld: 119 mg/dL — ABNORMAL HIGH (ref 70–99)
Potassium: 3.6 mmol/L (ref 3.5–5.1)
Sodium: 138 mmol/L (ref 135–145)
Total Bilirubin: 0.9 mg/dL (ref 0.3–1.2)
Total Protein: 7.8 g/dL (ref 6.0–8.3)

## 2014-07-22 LAB — CBC
HCT: 41.8 % (ref 39.0–52.0)
Hemoglobin: 15.2 g/dL (ref 13.0–17.0)
MCH: 31.5 pg (ref 26.0–34.0)
MCHC: 36.4 g/dL — AB (ref 30.0–36.0)
MCV: 86.7 fL (ref 78.0–100.0)
PLATELETS: 223 10*3/uL (ref 150–400)
RBC: 4.82 MIL/uL (ref 4.22–5.81)
RDW: 13.3 % (ref 11.5–15.5)
WBC: 7.1 10*3/uL (ref 4.0–10.5)

## 2014-07-22 LAB — I-STAT CHEM 8, ED
BUN: 11 mg/dL (ref 6–23)
Calcium, Ion: 1.25 mmol/L — ABNORMAL HIGH (ref 1.12–1.23)
Chloride: 104 mmol/L (ref 96–112)
Creatinine, Ser: 0.9 mg/dL (ref 0.50–1.35)
GLUCOSE: 121 mg/dL — AB (ref 70–99)
HEMATOCRIT: 47 % (ref 39.0–52.0)
HEMOGLOBIN: 16 g/dL (ref 13.0–17.0)
POTASSIUM: 3.6 mmol/L (ref 3.5–5.1)
SODIUM: 143 mmol/L (ref 135–145)
TCO2: 23 mmol/L (ref 0–100)

## 2014-07-22 MED ORDER — IOHEXOL 300 MG/ML  SOLN
100.0000 mL | Freq: Once | INTRAMUSCULAR | Status: AC | PRN
  Administered 2014-07-22: 100 mL via INTRAVENOUS

## 2014-07-22 MED ORDER — MORPHINE SULFATE 4 MG/ML IJ SOLN
4.0000 mg | Freq: Once | INTRAMUSCULAR | Status: AC
  Administered 2014-07-22: 4 mg via INTRAVENOUS
  Filled 2014-07-22: qty 1

## 2014-07-22 NOTE — ED Notes (Signed)
Pt complains of left flank pain for one week, hx of one kidney stone, no trouble urinating
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
## 2014-07-23 MED ORDER — POLYETHYLENE GLYCOL 3350 17 GM/SCOOP PO POWD: 17.0000 g | Freq: Every day | ORAL | Status: DC

## 2014-07-23 NOTE — ED Notes (Signed)
Patient verbalizes understanding of discharge instructions, prescription medications, home care and follow up care. Patient ambulatory out of department at this time with family. 

## 2014-07-23 NOTE — ED Provider Notes (Signed)
CT scan shows that you're constipated that you have some mild diverticular disease but no sign of infection at this time  Junius Creamer, NP 07/23/14 7672  April Palumbo, MD 07/23/14 [phone]

## 2014-07-23 NOTE — Discharge Instructions (Signed)
Abdominal Pain Many things can cause belly (abdominal) pain. Most times, the belly pain is not dangerous. Many cases of belly pain can be watched and treated at home. HOME CARE   Do not take medicines that help you go poop (laxatives) unless told to by your doctor.  Only take medicine as told by your doctor.  Eat or drink as told by your doctor. Your doctor will tell you if you should be on a special diet. GET HELP IF:  You do not know what is causing your belly pain.  You have belly pain while you are sick to your stomach (nauseous) or have runny poop (diarrhea).  You have pain while you pee or poop.  Your belly pain wakes you up at night.  You have belly pain that gets worse or better when you eat.  You have belly pain that gets worse when you eat fatty foods.  You have a fever. GET HELP RIGHT AWAY IF:   The pain does not go away within 2 hours.  You keep throwing up (vomiting).  The pain changes and is only in the right or left part of the belly.  You have bloody or tarry looking poop. MAKE SURE YOU:   Understand these instructions.  Will watch your condition.  Will get help right away if you are not doing well or get worse. Document Released: 09/13/2007 Document Revised: 04/01/2013 Document Reviewed: 12/04/2012 [facility] Patient Information 2015 [place], [place]. This information is not intended to replace advice given to you by your health care provider. Make sure you discuss any questions you have with your health care provider. Your CT scan, shows that you're constipated that you have some diverticular disease but no infection of the diverticulum.  At this point, urine normal white count, indicating no infection.  You have been given a prescription from your lacks please start by using 1 Full twice a day intrauterine having regular bowel movements, then back off to once a day or once every other day.  Follow-up with your primary care physician

## 2014-08-19 ENCOUNTER — Ambulatory Visit: Payer: Medicare Other | Admitting: Family Medicine

## 2014-08-24 ENCOUNTER — Encounter: Payer: Self-pay | Admitting: Family Medicine

## 2014-08-24 ENCOUNTER — Ambulatory Visit (INDEPENDENT_AMBULATORY_CARE_PROVIDER_SITE_OTHER): Payer: Medicare Other | Admitting: Family Medicine

## 2014-08-24 VITALS — BP 128/90 | HR 85 | Temp 98.2°F | Wt 219.0 lb

## 2014-08-24 DIAGNOSIS — I152 Hypertension secondary to endocrine disorders: Secondary | ICD-10-CM | POA: Insufficient documentation

## 2014-08-24 DIAGNOSIS — G8929 Other chronic pain: Secondary | ICD-10-CM | POA: Insufficient documentation

## 2014-08-24 DIAGNOSIS — I10 Essential (primary) hypertension: Secondary | ICD-10-CM

## 2014-08-24 DIAGNOSIS — F1911 Other psychoactive substance abuse, in remission: Secondary | ICD-10-CM | POA: Insufficient documentation

## 2014-08-24 DIAGNOSIS — E118 Type 2 diabetes mellitus with unspecified complications: Secondary | ICD-10-CM

## 2014-08-24 DIAGNOSIS — K5900 Constipation, unspecified: Secondary | ICD-10-CM | POA: Insufficient documentation

## 2014-08-24 DIAGNOSIS — E119 Type 2 diabetes mellitus without complications: Secondary | ICD-10-CM | POA: Insufficient documentation

## 2014-08-24 DIAGNOSIS — L409 Psoriasis, unspecified: Secondary | ICD-10-CM | POA: Insufficient documentation

## 2014-08-24 DIAGNOSIS — G4700 Insomnia, unspecified: Secondary | ICD-10-CM | POA: Insufficient documentation

## 2014-08-24 DIAGNOSIS — E1149 Type 2 diabetes mellitus with other diabetic neurological complication: Secondary | ICD-10-CM

## 2014-08-24 DIAGNOSIS — F1011 Alcohol abuse, in remission: Secondary | ICD-10-CM | POA: Insufficient documentation

## 2014-08-24 HISTORY — DX: Constipation, unspecified: K59.00

## 2014-08-24 LAB — COMPREHENSIVE METABOLIC PANEL
ALBUMIN: 4.2 g/dL (ref 3.5–5.2)
ALT: 52 U/L (ref 0–53)
AST: 38 U/L — ABNORMAL HIGH (ref 0–37)
Alkaline Phosphatase: 65 U/L (ref 39–117)
BUN: 10 mg/dL (ref 6–23)
CALCIUM: 9.7 mg/dL (ref 8.4–10.5)
CHLORIDE: 101 meq/L (ref 96–112)
CO2: 28 meq/L (ref 19–32)
Creat: 0.9 mg/dL (ref 0.50–1.35)
Glucose, Bld: 93 mg/dL (ref 70–99)
Potassium: 3.8 mEq/L (ref 3.5–5.3)
Sodium: 138 mEq/L (ref 135–145)
Total Bilirubin: 0.9 mg/dL (ref 0.2–1.2)
Total Protein: 7.8 g/dL (ref 6.0–8.3)

## 2014-08-24 LAB — LIPID PANEL
CHOL/HDL RATIO: 3.7 ratio
CHOLESTEROL: 175 mg/dL (ref 0–200)
HDL: 47 mg/dL (ref >=40)
LDL Cholesterol: 110 mg/dL — ABNORMAL HIGH (ref 0–99)
TRIGLYCERIDES: 90 mg/dL (ref <150)
VLDL: 18 mg/dL (ref 0–40)

## 2014-08-24 LAB — POCT GLYCOSYLATED HEMOGLOBIN (HGB A1C): Hemoglobin A1C: 5.2

## 2014-08-24 MED ORDER — AMLODIPINE BESYLATE 5 MG PO TABS: 5.0000 mg | ORAL_TABLET | Freq: Every day | ORAL | Status: DC

## 2014-08-24 MED ORDER — LISINOPRIL 40 MG PO TABS: 40.0000 mg | ORAL_TABLET | Freq: Every day | ORAL | Status: DC

## 2014-08-24 MED ORDER — GABAPENTIN 300 MG PO CAPS: 300.0000 mg | ORAL_CAPSULE | Freq: Three times a day (TID) | ORAL | Status: DC

## 2014-08-24 NOTE — Patient Instructions (Signed)
Nice to meet you today. Continue to take lisinopril and amlodipine for your blood pressure. Increase the gabapentin to 1 tablet 3 times daily. I hope that this will help with your chronic pain.  I'm getting some labs today and someone will call you or send you a letter with the results when available.  Please call the clinic at [phone] and asked to speak to [facility] about your financial and Medicaid concerns.  Come back to see me next week or when convenient to discuss the other issues we were not able to discuss today.  Take care, Dr. Jacinto Reap

## 2014-08-24 NOTE — Assessment & Plan Note (Signed)
A1c today and follow-up at next available appointment

## 2014-08-24 NOTE — Assessment & Plan Note (Signed)
Currently now well controlled Patient call Cameron Diaz about reinstating Medicaid that will allow him to go back to pain management clinic Will not refill Percocet today Increase gabapentin to 300 mg 3 times a day Continue Voltaren gel when necessary

## 2014-08-24 NOTE — Assessment & Plan Note (Signed)
Well-controlled Continue amlodipine 5 mg daily and lisinopril 40 mg daily Bmet, lipid panel, A1c today Follow-up in 3 months
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
## 2014-08-26 ENCOUNTER — Telehealth: Payer: Self-pay | Admitting: Family Medicine

## 2014-08-26 DIAGNOSIS — E785 Hyperlipidemia, unspecified: Secondary | ICD-10-CM | POA: Insufficient documentation

## 2014-08-26 DIAGNOSIS — E1169 Type 2 diabetes mellitus with other specified complication: Secondary | ICD-10-CM | POA: Insufficient documentation

## 2014-08-26 MED ORDER — ATORVASTATIN CALCIUM 40 MG PO TABS: 40.0000 mg | ORAL_TABLET | Freq: Every day | ORAL | Status: DC

## 2014-08-26 NOTE — Telephone Encounter (Signed)
Reviewed results from previous visit.  A1c 5.2, CMET wnl.  Lipid panel reveals ASCVD 10 yr risk of 17.9%.  Called patient to discuss results.  He has never taken anything for cholesterol.  Reports that he has tried to change his diet, but knows that he has eaten a lot more fried foods in the last 2-3 weeks.  He is willing to take a statin for his cholesterol.  He is planning to return to see me in ~2 wks and we will discuss how Atorvastatin 40mg  daily is treating him.  Virginia Crews, MD, MPH PGY-1,  [facility] 08/26/2014 2:11 PM

## 2014-09-08 ENCOUNTER — Encounter: Payer: Self-pay | Admitting: Family Medicine

## 2014-09-08 ENCOUNTER — Ambulatory Visit (INDEPENDENT_AMBULATORY_CARE_PROVIDER_SITE_OTHER): Payer: Medicare Other | Admitting: Family Medicine

## 2014-09-08 VITALS — BP 136/84 | HR 95 | Temp 97.9°F | Ht 67.0 in | Wt 217.0 lb

## 2014-09-08 DIAGNOSIS — E118 Type 2 diabetes mellitus with unspecified complications: Secondary | ICD-10-CM

## 2014-09-08 DIAGNOSIS — G8929 Other chronic pain: Secondary | ICD-10-CM | POA: Diagnosis not present

## 2014-09-08 DIAGNOSIS — L409 Psoriasis, unspecified: Secondary | ICD-10-CM

## 2014-09-08 DIAGNOSIS — Z86010 Personal history of colonic polyps: Secondary | ICD-10-CM

## 2014-09-08 DIAGNOSIS — L84 Corns and callosities: Secondary | ICD-10-CM

## 2014-09-08 DIAGNOSIS — E785 Hyperlipidemia, unspecified: Secondary | ICD-10-CM

## 2014-09-08 HISTORY — DX: Corns and callosities: L84

## 2014-09-08 MED ORDER — CLOBETASOL PROPIONATE 0.05 % EX FOAM: Freq: Two times a day (BID) | CUTANEOUS | Status: DC

## 2014-09-08 MED ORDER — LOVASTATIN 40 MG PO TABS: 40.0000 mg | ORAL_TABLET | Freq: Every day | ORAL | Status: DC

## 2014-09-08 MED ORDER — MELOXICAM 7.5 MG PO TABS: 7.5000 mg | ORAL_TABLET | Freq: Every day | ORAL | Status: DC

## 2014-09-08 MED ORDER — FLUOCINONIDE 0.05 % EX OINT: 1.0000 "application " | TOPICAL_OINTMENT | Freq: Two times a day (BID) | CUTANEOUS | Status: DC

## 2014-09-08 MED ORDER — GABAPENTIN 300 MG PO CAPS: 300.0000 mg | ORAL_CAPSULE | Freq: Every day | ORAL | Status: DC

## 2014-09-08 NOTE — Assessment & Plan Note (Signed)
Moderate plaque psoriasis Start fluocinonide ointment twice a day to extremities and clobetasol foam twice a day to scalp Encourage patient to moisturize often May require referral to dermatology in the future

## 2014-09-08 NOTE — Assessment & Plan Note (Signed)
Prescription given for diabetic shoes f/u at next visit

## 2014-09-08 NOTE — Assessment & Plan Note (Signed)
Patient did not tolerate atorvastatin 40 mg daily due to headaches Start lovastatin 40 mg daily

## 2014-09-08 NOTE — Assessment & Plan Note (Signed)
A1c well controlled Continue metformin XR 500 mg daily Foot exam completed today Prescription given for diabetic shoes Referral to ophthalmology for diabetic eye exam Follow-up in 3 months

## 2014-09-08 NOTE — Assessment & Plan Note (Signed)
Decrease gabapentin back to 300 mg daily at bedtime as patient did not tolerate daytime doses Continue Voltaren gel as needed Discontinue Percocet Start Mobic 7.5 mg daily Follow-up in one month
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
## 2014-09-08 NOTE — Assessment & Plan Note (Signed)
Patient given paper to call for screening colonoscopy with GI

## 2014-09-08 NOTE — Patient Instructions (Addendum)
Nice to see you again today.  For the psoriasis, use the ointment on your arms and legs twice daily.  Use the foam twice daily on your scalp.  If the psoriasis is not getting better or getting worse, call me and we will get you into see dermatologist.  For the pain, lets go back to once daily gabapentin 300mg  at bedtime.  Keep using voltaren gel as it helps.  Try taking Mobic daily for pain.  You need diabetic shoes fitted for your calluses.  It would be beneficial to see podiatry if you can afford the co-pay.  Come back to see me in 1 month.  Take care, Dr. Jacinto Reap

## 2014-10-01 ENCOUNTER — Other Ambulatory Visit: Payer: Self-pay | Admitting: Family Medicine

## 2014-10-01 MED ORDER — METFORMIN HCL ER 500 MG PO TB24: 500.0000 mg | ORAL_TABLET | Freq: Every day | ORAL | Status: DC

## 2014-10-01 NOTE — Telephone Encounter (Signed)
Needs a refill on Metformin. Thank you, Fonda Kinder, ASA

## 2014-12-30 ENCOUNTER — Other Ambulatory Visit: Payer: Self-pay | Admitting: Family Medicine

## 2014-12-30 NOTE — Telephone Encounter (Signed)
Wife called and would like to know if the doctor can call in any anxiety and sleep medication for her husband. His brother passed and he has not slept in 3 days. She doesn't want a lot but maybe enough until the funeral on Monday. jw

## 2014-12-30 NOTE — Telephone Encounter (Signed)
I don't see where the patient has been on anxiety medication by [place]. Would you please have them make an appt- it would be beneficial for him to speak with someone as well. Ideally in the AM when integrative care is present so they can spend more time discussing coping mechanisms.   Thanks, Archie Patten, MD [facility] Resident  12/30/2014, 5:23 PM

## 2014-12-31 ENCOUNTER — Telehealth: Payer: Self-pay | Admitting: Family Medicine

## 2014-12-31 DIAGNOSIS — Z1211 Encounter for screening for malignant neoplasm of colon: Secondary | ICD-10-CM

## 2014-12-31 DIAGNOSIS — Z0000 Encounter for general adult medical examination without abnormal findings: Secondary | ICD-10-CM

## 2014-12-31 NOTE — Telephone Encounter (Signed)
Pt would like a referral to have a colonoscopy done. jw

## 2014-12-31 NOTE — Telephone Encounter (Signed)
Referral made to GI for colonoscopy.  Thanks, Archie Patten, MD [facility] Resident  12/31/2014, 5:02 PM

## 2014-12-31 NOTE — Telephone Encounter (Signed)
Pateint has appointment 9/23 with Nettey.

## 2015-01-01 ENCOUNTER — Encounter: Payer: Self-pay | Admitting: Family Medicine

## 2015-01-01 ENCOUNTER — Ambulatory Visit (INDEPENDENT_AMBULATORY_CARE_PROVIDER_SITE_OTHER): Payer: Medicare Other | Admitting: Family Medicine

## 2015-01-01 VITALS — BP 139/77 | HR 101 | Temp 98.3°F | Wt 220.0 lb

## 2015-01-01 DIAGNOSIS — F411 Generalized anxiety disorder: Secondary | ICD-10-CM | POA: Diagnosis not present

## 2015-01-01 MED ORDER — LORAZEPAM 1 MG PO TABS: 1.0000 mg | ORAL_TABLET | Freq: Two times a day (BID) | ORAL | Status: DC | PRN

## 2015-01-01 NOTE — Patient Instructions (Signed)
Thank you for coming to see me today. It was a pleasure. Today we talked about:   Anxiety: I am sorry to hear about your loss. I know Cedric will be missed. I want to help you cope with this a little bit. Since you have people around you to talk with you, I will prescribe some mediation to help with your nerves. Please be careful with this medication as it is habit forming. Please follow-up with Dr. Brita Romp.  If you have any questions or concerns, please do not hesitate to call the office at [phone].  Sincerely,  Cordelia Poche, MD

## 2015-01-01 NOTE — Progress Notes (Signed)
    Subjective   Cameron Diaz is a 54 y.o. male that presents for a same day visit  1. Insomnia: Patient states his little brother died 5 days ago. He does not have family in the area. His wife gives him support. He reports having a tough time coping. His family lives in [place]. He usually stays at home but does go out during the week at times to bible studies, dinners, church service. Burial service planned for next Friday 9/30 @1400 .   ROS Per HPI  Social History  Substance Use Topics  . Smoking status: Current Every Day Smoker -- 0.25 packs/day  . Smokeless tobacco: Never Used  . Alcohol Use: No    No Known Allergies  Objective   BP 139/77 mmHg  Pulse 101  Temp(Src) 98.3 F (36.8 C) (Oral)  Wt 220 lb (99.791 kg)  General: Well appearing Psych: mood appears slightly depressed. Slightly tearful at times. Patient reminiscing about brother which would at times bring smiles and other times tears. No suicidal ideation.   Assessment and Plan   Meds ordered this encounter  Medications  . LORazepam (ATIVAN) 1 MG tablet    Sig: Take 1 tablet (1 mg total) by mouth 2 (two) times daily as needed for anxiety.    Dispense:  30 tablet    Refill:  1    Anxiety: patient with recent loss of immediate family member. Currently having trouble coping but states he has a good support system with his wife and his church. Patient with drug abuse history. Discussed this with him and discussed habit forming nature of benzodiazepines. Patient will keep this in mind when using this medication and agrees that he would like this treatment as short term management. He declines integrative care or therapy referral. Patient has no suicidal ideation.  Ativan 1mg  BID prn anxiety  Follow-up with PCP  Emergency information for suicidal ideation

## 2015-01-31 ENCOUNTER — Other Ambulatory Visit: Payer: Self-pay | Admitting: Family Medicine

## 2015-02-10 ENCOUNTER — Ambulatory Visit: Payer: Medicare Other | Admitting: Family Medicine

## 2015-02-19 ENCOUNTER — Ambulatory Visit (INDEPENDENT_AMBULATORY_CARE_PROVIDER_SITE_OTHER): Payer: Medicare Other | Admitting: Family Medicine

## 2015-02-19 ENCOUNTER — Encounter: Payer: Self-pay | Admitting: Family Medicine

## 2015-02-19 VITALS — BP 130/74 | HR 93 | Temp 98.7°F | Ht 67.0 in | Wt 223.0 lb

## 2015-02-19 DIAGNOSIS — G8929 Other chronic pain: Secondary | ICD-10-CM

## 2015-02-19 DIAGNOSIS — Z86010 Personal history of colonic polyps: Secondary | ICD-10-CM | POA: Diagnosis not present

## 2015-02-19 DIAGNOSIS — I10 Essential (primary) hypertension: Secondary | ICD-10-CM | POA: Diagnosis not present

## 2015-02-19 DIAGNOSIS — F32A Depression, unspecified: Secondary | ICD-10-CM

## 2015-02-19 DIAGNOSIS — E118 Type 2 diabetes mellitus with unspecified complications: Secondary | ICD-10-CM

## 2015-02-19 DIAGNOSIS — F329 Major depressive disorder, single episode, unspecified: Secondary | ICD-10-CM | POA: Diagnosis not present

## 2015-02-19 LAB — BASIC METABOLIC PANEL WITH GFR
BUN: 11 mg/dL (ref 7–25)
CALCIUM: 9.5 mg/dL (ref 8.6–10.3)
CO2: 25 mmol/L (ref 20–31)
CREATININE: 0.95 mg/dL (ref 0.70–1.33)
Chloride: 111 mmol/L — ABNORMAL HIGH (ref 98–110)
GFR, Est African American: >89 mL/min (ref >=60)
GFR, Est Non African American: >89 mL/min (ref >=60)
GLUCOSE: 95 mg/dL (ref 65–99)
Potassium: 4.7 mmol/L (ref 3.5–5.3)
SODIUM: 146 mmol/L (ref 135–146)

## 2015-02-19 LAB — POCT GLYCOSYLATED HEMOGLOBIN (HGB A1C): Hemoglobin A1C: 5.8

## 2015-02-19 MED ORDER — DICLOFENAC SODIUM 1 % TD GEL: 2.0000 g | Freq: Every evening | TRANSDERMAL | Status: DC | PRN

## 2015-02-19 MED ORDER — DULOXETINE HCL 30 MG PO CPEP: 30.0000 mg | ORAL_CAPSULE | Freq: Every day | ORAL | Status: DC
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
## 2015-02-19 NOTE — Patient Instructions (Addendum)
Nice to see you again today. Keep taking your current medications except for stopping meloxicam. Start Cymbalta 30 mg daily for 1 week and then increase to 60 mg daily. This will help with your pain in your mood. We are getting some labs today and someone will call you or send you a letter with the results when they're available. I'm giving you a list of locations to call to get a colonoscopy. I'm giving you cards for our nutritionist you should call her and set up an appointment to discuss diet. I sent a referral for an eye doctor. Someone to call you about this appointment soon.  Come back to see me in one month.  Take care,  Dr. Jacinto Reap

## 2015-02-20 ENCOUNTER — Encounter: Payer: Self-pay | Admitting: Family Medicine

## 2015-02-20 DIAGNOSIS — F329 Major depressive disorder, single episode, unspecified: Secondary | ICD-10-CM | POA: Insufficient documentation

## 2015-02-20 DIAGNOSIS — F32A Depression, unspecified: Secondary | ICD-10-CM | POA: Insufficient documentation

## 2015-02-20 NOTE — Assessment & Plan Note (Signed)
Continue gabapentin qhs, voltaren gel prn D/c mobic Start cymbalta 30mg  daily x1wk and then 60mg  daily F/u 1 month

## 2015-02-20 NOTE — Assessment & Plan Note (Signed)
Appreciate BH consult Multifactorial - chronic pain and grief from loss affecting Starting cymbalta for chronic pain which will also help mood No SI/HI F/u in 1 month

## 2015-02-20 NOTE — Assessment & Plan Note (Signed)
Information about colonoscopy and phone numbers given

## 2015-02-20 NOTE — Assessment & Plan Note (Signed)
Well controlled Continue current meds BMET today F/u in 3-6 months

## 2015-02-20 NOTE — Assessment & Plan Note (Signed)
Well controlled A1c today Continue current meds F/u in 6 months Referral to ophtho for eye exam Nutrition referral for diet counseling

## 2015-05-08 ENCOUNTER — Other Ambulatory Visit: Payer: Self-pay | Admitting: Family Medicine

## 2015-06-07 ENCOUNTER — Other Ambulatory Visit: Payer: Self-pay | Admitting: Family Medicine

## 2015-06-25 ENCOUNTER — Other Ambulatory Visit: Payer: Self-pay | Admitting: Family Medicine

## 2015-06-25 DIAGNOSIS — E118 Type 2 diabetes mellitus with unspecified complications: Secondary | ICD-10-CM

## 2015-06-25 MED ORDER — ONETOUCH ULTRA 2 W/DEVICE KIT: PACK | Status: DC

## 2015-06-25 MED ORDER — GLUCOSE BLOOD VI STRP: ORAL_STRIP | Status: DC

## 2015-06-25 NOTE — Telephone Encounter (Signed)
Patient informed, expressed understanding. 

## 2015-06-25 NOTE — Telephone Encounter (Signed)
Pt needs another glucometer. The one he has now is an accu-check. It stopped working this week. He uses Paediatric nurse on [address]. He wants to know if his health insurance will pay for this. Please call pt when Rx is sent in

## 2015-06-25 NOTE — Telephone Encounter (Signed)
Medicare prefers onetouch meter.  Rx sent to pharmacy for test strips and meter.  Please let patient know that these will be at his pharmacy.  Virginia Crews, MD, MPH PGY-2,  [facility] 06/25/2015 2:30 PM

## 2015-07-29 ENCOUNTER — Encounter: Payer: Medicare Other | Admitting: Family Medicine

## 2015-08-09 DIAGNOSIS — H40013 Open angle with borderline findings, low risk, bilateral: Secondary | ICD-10-CM | POA: Diagnosis not present

## 2015-08-09 DIAGNOSIS — E119 Type 2 diabetes mellitus without complications: Secondary | ICD-10-CM | POA: Diagnosis not present

## 2015-08-09 LAB — HM DIABETES EYE EXAM

## 2015-08-26 ENCOUNTER — Other Ambulatory Visit: Payer: Self-pay | Admitting: Family Medicine

## 2015-08-30 ENCOUNTER — Encounter: Payer: Self-pay | Admitting: Family Medicine

## 2015-08-30 ENCOUNTER — Other Ambulatory Visit (INDEPENDENT_AMBULATORY_CARE_PROVIDER_SITE_OTHER): Payer: PPO

## 2015-08-30 ENCOUNTER — Ambulatory Visit (INDEPENDENT_AMBULATORY_CARE_PROVIDER_SITE_OTHER): Payer: PPO | Admitting: Family Medicine

## 2015-08-30 VITALS — BP 136/79 | HR 84 | Temp 97.9°F | Ht 67.0 in | Wt 217.0 lb

## 2015-08-30 DIAGNOSIS — I10 Essential (primary) hypertension: Secondary | ICD-10-CM

## 2015-08-30 DIAGNOSIS — E118 Type 2 diabetes mellitus with unspecified complications: Secondary | ICD-10-CM | POA: Diagnosis not present

## 2015-08-30 DIAGNOSIS — L409 Psoriasis, unspecified: Secondary | ICD-10-CM | POA: Diagnosis not present

## 2015-08-30 DIAGNOSIS — G8929 Other chronic pain: Secondary | ICD-10-CM | POA: Diagnosis not present

## 2015-08-30 LAB — POCT GLYCOSYLATED HEMOGLOBIN (HGB A1C): Hemoglobin A1C: 5.7

## 2015-08-30 MED ORDER — GABAPENTIN 300 MG PO CAPS: 300.0000 mg | ORAL_CAPSULE | Freq: Three times a day (TID) | ORAL | Status: DC

## 2015-08-30 MED ORDER — TRAMADOL HCL 50 MG PO TABS: 50.0000 mg | ORAL_TABLET | Freq: Three times a day (TID) | ORAL | Status: DC | PRN

## 2015-08-30 MED ORDER — FLUOCINONIDE 0.05 % EX OINT: TOPICAL_OINTMENT | CUTANEOUS | Status: DC

## 2015-08-30 MED ORDER — CLOBETASOL PROPIONATE 0.05 % EX FOAM: Freq: Two times a day (BID) | CUTANEOUS | Status: DC

## 2015-08-30 MED ORDER — LISINOPRIL 40 MG PO TABS: 40.0000 mg | ORAL_TABLET | Freq: Every day | ORAL | Status: DC

## 2015-08-30 MED ORDER — AMLODIPINE BESYLATE 5 MG PO TABS: 5.0000 mg | ORAL_TABLET | Freq: Every day | ORAL | Status: DC

## 2015-08-30 MED ORDER — METFORMIN HCL ER 500 MG PO TB24: 500.0000 mg | ORAL_TABLET | Freq: Every day | ORAL | Status: DC
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
## 2015-08-30 NOTE — Assessment & Plan Note (Signed)
Well controlled. Continue current medications  

## 2015-08-30 NOTE — Assessment & Plan Note (Signed)
A1c very well controlled Continue metformin Up-to-date on eye exam Foot exam performed today Follow-up in 6 months

## 2015-08-30 NOTE — Patient Instructions (Addendum)
A1c goal is <7.  Today it is 5.7.  Continue taking current medications.  Increase gabapentin to 300mg  three times daily.  I will see you back in 1 month for physical.  Take care, Dr. Jacinto Reap

## 2015-08-30 NOTE — Assessment & Plan Note (Signed)
Increase gabapentin to 300 mg 3 times a day Patient does not want to take Cymbalta anymore Tramadol for severe breakthrough pain Follow-up in one month and titrate gabapentin as necessary

## 2015-10-04 ENCOUNTER — Telehealth: Payer: Self-pay | Admitting: Family Medicine

## 2015-10-04 NOTE — Telephone Encounter (Signed)
Pt is calling because he has been in pain for a couple of days and would like the doctor to call him in something or at least talk to a nurse so that he doesn't have to go to the ED. Blima Rich

## 2015-10-05 NOTE — Telephone Encounter (Signed)
Pt informed. Scheduled him to see dr Alease Frame tomorrow. Says medication wasn't working form him. Cameron Diaz, CMA

## 2015-10-05 NOTE — Telephone Encounter (Signed)
Have patient schedule f/u appt to discuss chronic pain.  We have discussed that narcotics are not indicated and will not be prescribed.  He has tramadol from last visit that should still have some left.  Virginia Crews, MD, MPH PGY-2,  [facility] 10/05/2015 5:09 PM

## 2015-10-06 ENCOUNTER — Ambulatory Visit: Payer: PPO | Admitting: Family Medicine

## 2015-12-11 ENCOUNTER — Other Ambulatory Visit: Payer: Self-pay | Admitting: Family Medicine

## 2015-12-14 ENCOUNTER — Other Ambulatory Visit: Payer: Self-pay | Admitting: Family Medicine

## 2015-12-21 ENCOUNTER — Ambulatory Visit (INDEPENDENT_AMBULATORY_CARE_PROVIDER_SITE_OTHER): Payer: PPO | Admitting: Family Medicine

## 2015-12-21 ENCOUNTER — Encounter: Payer: Self-pay | Admitting: Family Medicine

## 2015-12-21 DIAGNOSIS — M542 Cervicalgia: Secondary | ICD-10-CM | POA: Diagnosis not present

## 2015-12-21 DIAGNOSIS — G8929 Other chronic pain: Secondary | ICD-10-CM | POA: Diagnosis not present

## 2015-12-21 MED ORDER — PREDNISONE 10 MG PO TABS: ORAL_TABLET | ORAL | 0 refills | Status: DC

## 2015-12-21 MED ORDER — GABAPENTIN 300 MG PO CAPS: 300.0000 mg | ORAL_CAPSULE | Freq: Three times a day (TID) | ORAL | 3 refills | Status: DC

## 2015-12-21 NOTE — Progress Notes (Signed)
   HPI  CC: Neck and right arm pain Patient is here with complaints of chronic neck and right arm pain. He states that pain is worse at night and has been going on for the past year. Pain begins at the bases neck and radiates across his right shoulder and down to his fingertips. He denies any injury or eliciting event. He describes this discomfort as pins and needles similar to "when your leg falls asleep". Symptoms have been gradually getting worse with time. Nothing seems to make this pain/discomfort any better.  He denies any recent fever, trauma, injury, headache, lightheadedness, dizziness, chest pain, shortness of breath, nausea, vomiting, diarrhea, weakness, or fecal/bowel incontinence.  Review of Systems   See HPI for ROS. All other systems reviewed and are negative.  CC, SH/smoking status, and VS noted  Objective: BP 127/75   Pulse 100   Temp 98.7 F (37.1 C) (Oral)   Ht 5\' 10"  (1.778 m)   Wt 221 lb 6.4 oz (100.4 kg)   BMI 31.77 kg/m  Gen: NAD, alert, cooperative. CV: Well-perfused. Resp: Non-labored. Neuro: Sensation intact throughout, with paresthesias in the right hand (generalized) Arm, right: Full range of motion, strength 5/5 throughout, no specific areas of point tenderness, negative Tinel's/Phalen's. Paresthesias elicited with rotation of the neck to the left with slight neck extension and loading of the C-spine by the provider.   Assessment and plan:  Neck pain Patient is complaining of pain in his neck and right arm. 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 We had a long discussion about the treatment course he would prefer to follow. We discussed doing nothing all the way to referral to orthopedics for a surgical consultation (and everything in between). At this time patient was only ingested in medical management at this time. - Prednisone Dosepak provided, 6 days - Gabapentin. Patient is only been taking this medication once  daily. I informed him that increasing this to 3 times daily would likely provide some benefit. - Discussed the possibility of additional imaging with x-ray/MRI. Patient is not interested in spinal surgery or epidural injections, because of this the decision was made not to look into further imaging at this time. - Discussed possible physical therapy. I'm not confident that this will help at this time and patient was not interested anyway. - Follow-up as needed.   Meds ordered this encounter  Medications  . predniSONE (DELTASONE) 10 MG tablet    Sig: Take by mouth as prescribed.    Dispense:  21 tablet    Refill:  0  . gabapentin (NEURONTIN) 300 MG capsule    Sig: Take 1 capsule (300 mg total) by mouth 3 (three) times daily.    Dispense:  90 capsule    Refill:  3     Elberta Leatherwood, MD,MS,  PGY3 12/21/2015 6:29 PM

## 2015-12-21 NOTE — Assessment & Plan Note (Addendum)
Patient is complaining of pain in his neck and right arm. 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 We had a long discussion about the treatment course he would prefer to follow. We discussed doing nothing all the way to referral to orthopedics for a surgical consultation (and everything in between). At this time patient was only ingested in medical management at this time. - Prednisone Dosepak provided, 6 days - Gabapentin. Patient is only been taking this medication once daily. I informed him that increasing this to 3 times daily would likely provide some benefit. - Discussed the possibility of additional imaging with x-ray/MRI. Patient is not interested in spinal surgery or epidural injections, because of this the decision was made not to look into further imaging at this time. - Discussed possible physical therapy. I'm not confident that this will help at this time and patient was not interested anyway. - Follow-up as needed.

## 2015-12-21 NOTE — Patient Instructions (Signed)
It was a pleasure seeing you today in our clinic. Today we discussed your arm numbness. Here is the treatment plan we have discussed and agreed upon together:   - I prescribed you prednisone. Take this as prescribed on the handout I provided you today. - Take your gabapentin 3 times a day every day. This should help with the nerve pain your complaining of. - Come back and see [place] in 2-4 weeks if you do not have any improvement in your symptoms.

## 2016-01-12 ENCOUNTER — Other Ambulatory Visit: Payer: Self-pay | Admitting: Family Medicine

## 2016-02-09 ENCOUNTER — Ambulatory Visit (INDEPENDENT_AMBULATORY_CARE_PROVIDER_SITE_OTHER): Payer: PPO | Admitting: Family Medicine

## 2016-02-09 ENCOUNTER — Encounter: Payer: Self-pay | Admitting: Family Medicine

## 2016-02-09 VITALS — BP 145/88 | HR 90 | Temp 98.1°F | Wt 220.0 lb

## 2016-02-09 DIAGNOSIS — G894 Chronic pain syndrome: Secondary | ICD-10-CM | POA: Diagnosis not present

## 2016-02-09 DIAGNOSIS — M542 Cervicalgia: Secondary | ICD-10-CM

## 2016-02-09 DIAGNOSIS — Z23 Encounter for immunization: Secondary | ICD-10-CM | POA: Diagnosis not present

## 2016-02-09 DIAGNOSIS — G4700 Insomnia, unspecified: Secondary | ICD-10-CM

## 2016-02-09 DIAGNOSIS — N529 Male erectile dysfunction, unspecified: Secondary | ICD-10-CM | POA: Diagnosis not present

## 2016-02-09 DIAGNOSIS — L409 Psoriasis, unspecified: Secondary | ICD-10-CM | POA: Diagnosis not present

## 2016-02-09 DIAGNOSIS — G8929 Other chronic pain: Secondary | ICD-10-CM

## 2016-02-09 DIAGNOSIS — Z1211 Encounter for screening for malignant neoplasm of colon: Secondary | ICD-10-CM

## 2016-02-09 MED ORDER — DICLOFENAC SODIUM 1 % TD GEL: 2.0000 g | Freq: Every evening | TRANSDERMAL | 2 refills | Status: DC | PRN

## 2016-02-09 MED ORDER — SILDENAFIL CITRATE 50 MG PO TABS: 50.0000 mg | ORAL_TABLET | Freq: Every day | ORAL | 0 refills | Status: DC | PRN

## 2016-02-09 MED ORDER — CLOBETASOL PROPIONATE 0.05 % EX FOAM: Freq: Two times a day (BID) | CUTANEOUS | 1 refills | Status: DC

## 2016-02-09 MED ORDER — FLUOCINONIDE 0.05 % EX OINT: TOPICAL_OINTMENT | CUTANEOUS | 1 refills | Status: DC

## 2016-02-09 MED ORDER — GABAPENTIN 300 MG PO CAPS: 600.0000 mg | ORAL_CAPSULE | Freq: Three times a day (TID) | ORAL | 3 refills | Status: DC

## 2016-02-09 MED ORDER — TRAMADOL HCL 50 MG PO TABS: 50.0000 mg | ORAL_TABLET | Freq: Three times a day (TID) | ORAL | 1 refills | Status: DC | PRN

## 2016-02-09 NOTE — Assessment & Plan Note (Signed)
Suspect this is related to neck and arm pain Increase gabapentin to 600 mg 3 times a day

## 2016-02-09 NOTE — Assessment & Plan Note (Signed)
Trial of Viagra Not taking other nitrates Consider referral to urology if needed

## 2016-02-09 NOTE — Patient Instructions (Addendum)
Nice to see you again today. Treat her psoriasis with consistent moisturizing twice daily. Try Aquaphor for this purpose. Also apply your steroid ointment and oil twice daily.    I'm referring you to gastroenterology for colonoscopy  Try Viagra for erectile dysfunction. We can consider referral to urology in the future if this is not helpful.  I'm referring you to neurosurgery regarding your neck and arm pain after your previous surgery. Increase her gabapentin to 600 mg 3 times a day. This should help you sleep and help with the pain. He can also try tramadol as needed when you have breakthrough pain.  Take care, Dr. B   Psoriasis Psoriasis is a long-term (chronic) condition of skin inflammation. It occurs because your immune system causes skin cells to form too quickly. As a result, too many skin cells grow and create raised, red patches (plaques) that look silvery on your skin. Plaques may appear anywhere on your body. They can be any size or shape. Psoriasis can come and go. The condition varies from mild to very severe. It cannot be passed from one person to another (not contagious).  CAUSES  The cause of psoriasis is not known, but certain factors can make the condition worse. These include:   Damage or trauma to the skin, such as cuts, scrapes, sunburn, and dryness.  Lack of sunlight.  Certain medicines.  Alcohol.  Tobacco use.  Stress.  Infections caused by bacteria or viruses. RISK FACTORS This condition is more likely to develop in:  People with a family history of psoriasis.  People who are Caucasian.  People who are between the ages of 15-59 and 37-92 years old. SYMPTOMS  There are five different types of psoriasis. You can have more than one type of psoriasis during your life. Types are:   Plaque.  Guttate.  Inverse.  Pustular.  Erythrodermic. Each type of psoriasis has different symptoms.   Plaque psoriasis symptoms include red, raised plaques with a  silvery white coating (scale). These plaques may be itchy. Your nails may be pitted and crumbly or fall off.  Guttate psoriasis symptoms include small red spots that often show up on your trunk, arms, and legs. These spots may develop after you have been sick, especially with strep throat.  Inverse psoriasis symptoms include plaques in your underarm area, under your breasts, or on your genitals, groin, or buttocks.  Pustular psoriasis symptoms include pus-filled bumps that are painful, red, and swollen on the palms of your hands or the soles of your feet. You also may feel exhausted, feverish, weak, or have no appetite.  Erythrodermic psoriasis symptoms include bright red skin that may look burned. You may have a fast heartbeat and a body temperature that is too high or too low. You may be itchy or in pain. DIAGNOSIS  Your health care provider may suspect psoriasis based on your symptoms and family history. Your health care provider will also do a physical exam. This may include a procedure to remove a tissue sample (biopsy) for testing. You may also be referred to a health care provider who specializes in skin diseases (dermatologist).  TREATMENT There is no cure for this condition, but treatment can help manage it. Goals of treatment include:   Helping your skin heal.  Reducing itching and inflammation.  Slowing the growth of new skin cells.  Helping your immune system respond better to your skin. Treatment varies, depending on the severity of your condition. Treatment may include:   Creams or  ointments.  Ultraviolet ray exposure (light therapy). This may include natural sunlight or light therapy in a medical office.  Medicines (systemic therapy). These medicines can help your body better manage skin cell turnover and inflammation. They may be used along with light therapy or ointments. You may also get antibiotic medicines if you have an infection. HOME CARE INSTRUCTIONS Skin  Care  Moisturize your skin as needed. Only use moisturizers that have been approved by your health care provider.   Apply cool compresses to the affected areas.   Do not scratch your skin.  Lifestyle  Do not use tobacco products. This includes cigarettes, chewing tobacco, and e-cigarettes. If you need help quitting, ask your health care provider.  Drink little or no alcohol.   Try techniques for stress reduction, such as meditation or yoga.  Get exposure to the sun as told by your health care provider. Do not get sunburned.   Consider joining a psoriasis support group.  Medicines  Take or use over-the-counter and prescription medicines only as told by your health care provider.  If you were prescribed an antibiotic, take or use it as told by your health care provider. Do not stop taking the antibiotic even if your condition starts to improve. General Instructions  Keep a journal to help track what triggers an outbreak. Try to avoid any triggers.   See a counselor or social worker if feelings of sadness, frustration, and hopelessness about your condition are interfering with your work and relationships.  Keep all follow-up visits as told by your health care provider. This is important. SEEK MEDICAL CARE IF:  Your pain gets worse.  You have increasing redness or warmth in the affected areas.   You have new or worsening pain or stiffness in your joints.  Your nails start to break easily or pull away from the nail bed.   You have a fever.   You feel depressed.   This information is not intended to replace advice given to you by your health care provider. Make sure you discuss any questions you have with your health care provider.   Document Released: 03/24/2000 Document Revised: 12/16/2014 Document Reviewed: 08/12/2014 Elsevier Interactive Patient Education [employer].

## 2016-02-09 NOTE — Progress Notes (Signed)
Subjective:   Cameron Diaz is a 55 y.o. male with a history of HTN, T2 DM, HLD, chronic pain, cervical radiculopathy here for follow-up of neck and arm pain  Neck and R arm pain - Patient was seen 12/21/15 for neck and arm pain and diagnosed with cervical radiculopathy - Was treated at that time with a six-day prednisone course and his gabapentin was increased to 3 times a day dosing - Today he reports pain is a little worse than last visit - occurs more frequently during the day than it was - pain is in neck and R arm - pain is worse at night - pain ongoing for ~1.5 yrs - denies any injury or trauma - pain feels like pins and needles similar to "when your leg falls asleep " that occurs suddenly - R arm is subjectively weaker than L, difficult to open jars - Denies fever, headache, dizziness, chest pain, shortness breath, nausea, vomiting, diarrhea, weakness, incontinence  HCM - reports he has still not had colonoscopy and knows that he needs one  Insomnia - stressed because brother went missing yesterday - not sleeping well in general for last year - difficulty getting to sleep - used to have trouble staying asleep because of nocturia - this is better now - watches sports and news before bed - seems to be due to shoulder pain - tossing and turning for ~45 min prior to falling asleep - no TV on in room, no lights on, no noise  ED - ongoing for ~64yrs - worse over last year - trouble getting and keeping erection - causing tension between him and his wife  Psoriasis - Not using steroid cream consistently - Not moisturizing consistently - Bothered by patch on his arm as he cannot wear short sleeves  Review of Systems:  Per HPI.   Social History: current smoker  Objective:  BP (!) 145/88   Pulse 90   Temp 98.1 F (36.7 C) (Oral)   Wt 220 lb (99.8 kg)   BMI 31.57 kg/m   Gen:  55 y.o. male in NAD HEENT: NCAT, MMM, EOMI, PERRL, anicteric sclerae CV: RRR, no MRG, no  JVD Resp: Non-labored, CTAB, no wheezes noted Abd: Soft, NTND, BS present, no guarding or organomegaly Ext: WWP, no edema MSK: Neck: Full range of motion, no point tenderness R arm: Full ROM, strength 5/5 throughout, no point tenderness, paresthesias elicited with rotation of the neck to the left with slight neck extension and loading of the C-spine by the provider  Neuro: Alert and oriented, speech normal      Chemistry      Component Value Date/Time   NA 146 02/19/2015 1630   K 4.7 02/19/2015 1630   CL 111 (H) 02/19/2015 1630   CO2 25 02/19/2015 1630   BUN 11 02/19/2015 1630   CREATININE 0.95 02/19/2015 1630      Component Value Date/Time   CALCIUM 9.5 02/19/2015 1630   ALKPHOS 65 08/24/2014 1002   AST 38 (H) 08/24/2014 1002   ALT 52 08/24/2014 1002   BILITOT 0.9 08/24/2014 1002      Lab Results  Component Value Date   WBC 7.1 07/22/2014   HGB 16.0 07/22/2014   HCT 47.0 07/22/2014   MCV 86.7 07/22/2014   PLT 223 07/22/2014   Lab Results  Component Value Date   TSH 0.405 09/27/2009   Lab Results  Component Value Date   HGBA1C 5.7 08/30/2015   Assessment & Plan:  Luismanuel RAYFORD PODGORNY is a 55 y.o. 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

## 2016-02-09 NOTE — Assessment & Plan Note (Signed)
Discuss frequent moisturization Discussed shorter cooler showers Discuss regular twice a day application of topical steroids Follow-up in one month and consider referral to dermatology if not improving

## 2016-02-09 NOTE — Assessment & Plan Note (Signed)
Exam and history consistent with cervical radiculopathy Increase gabapentin to 600 mg 3 times a day Patient is hesitant to get additional imaging at this time Given history of C6-7 discectomy by neurosurgery, will refer back to neurosurgery for further evaluation No red flags at present time

## 2016-02-09 NOTE — Addendum Note (Signed)
Addended by: Levert Feinstein F on: 02/09/2016 03:58 PM   Modules accepted: Orders

## 2016-02-09 NOTE — Addendum Note (Signed)
Addended by: Virginia Crews on: 02/09/2016 03:39 PM   Modules accepted: Orders

## 2016-02-09 NOTE — Assessment & Plan Note (Signed)
Increase gabapentin as above Tramadol for severe breakthrough pain

## 2016-02-10 ENCOUNTER — Telehealth: Payer: Self-pay | Admitting: Family Medicine

## 2016-02-10 MED ORDER — SILDENAFIL CITRATE 25 MG PO TABS: 25.0000 mg | ORAL_TABLET | Freq: Every day | ORAL | 3 refills | Status: DC | PRN

## 2016-02-10 MED ORDER — SILDENAFIL CITRATE 20 MG PO TABS: 40.0000 mg | ORAL_TABLET | Freq: Every day | ORAL | 0 refills | Status: DC | PRN

## 2016-02-10 NOTE — Telephone Encounter (Signed)
Patient informed, expressed understanding. 

## 2016-02-10 NOTE — Telephone Encounter (Signed)
New Rx sent.  Virginia Crews, MD, MPH PGY-3,  [facility] 02/10/2016 2:59 PM

## 2016-02-10 NOTE — Telephone Encounter (Signed)
Sent new Rx for 25mg  tablets to pharmacy.  This may not work as well, though as most people need 50mg  for ED.  Please let patient know.  Virginia Crews, MD, MPH PGY-3,  [facility] 02/10/2016 10:23 AM

## 2016-02-10 NOTE — Telephone Encounter (Signed)
Pt is calling because the prescription was written for 25 mg and not the 20 mg this new prescription is 1000.00 please call patient to discuss. jw

## 2016-02-10 NOTE — Telephone Encounter (Signed)
Pt called because his Viagra is very expensive for the 50 mg can we write a new prescription for the same medication but at 20 mg this would be a lot cheaper. jw

## 2016-02-11 NOTE — Telephone Encounter (Signed)
Spoke with and patient and informed him that the Rx had been sent. Nat Christen, CMA

## 2016-02-14 NOTE — Telephone Encounter (Signed)
This medication will need an authorization.  Form placed in MDs box for completion.  Will fax back when returned. Demani Mcbrien, [name], CMA

## 2016-02-16 NOTE — Telephone Encounter (Signed)
Faxed form back to [facility] @ [phone].  Will await response. Marvie Brevik, [name], CMA

## 2016-02-16 NOTE — Telephone Encounter (Signed)
Completed and given to [place].  Already discussed during last office visit with patient that insurance will likely not cover medications for erectile dysfunction.  Virginia Crews, MD, MPH PGY-3,  [facility] 02/16/2016 9:44 AM

## 2016-02-17 NOTE — Telephone Encounter (Signed)
PA for sildenafil 20 tablet was denied via [facility].  Patient is not covered under Medicare Part D for sexual or erectile dysfunction.  Medication is covered for diagnosis of pulmonary hypertension or BPH.  Derl Barrow, RN

## 2016-03-09 ENCOUNTER — Ambulatory Visit (INDEPENDENT_AMBULATORY_CARE_PROVIDER_SITE_OTHER): Payer: PPO | Admitting: Family Medicine

## 2016-03-09 ENCOUNTER — Encounter: Payer: Self-pay | Admitting: Family Medicine

## 2016-03-09 VITALS — BP 132/87 | HR 92 | Temp 98.1°F | Ht 70.0 in | Wt 219.8 lb

## 2016-03-09 DIAGNOSIS — R252 Cramp and spasm: Secondary | ICD-10-CM | POA: Diagnosis not present

## 2016-03-09 DIAGNOSIS — L409 Psoriasis, unspecified: Secondary | ICD-10-CM

## 2016-03-09 DIAGNOSIS — G4700 Insomnia, unspecified: Secondary | ICD-10-CM | POA: Diagnosis not present

## 2016-03-09 DIAGNOSIS — N529 Male erectile dysfunction, unspecified: Secondary | ICD-10-CM | POA: Diagnosis not present

## 2016-03-09 NOTE — Assessment & Plan Note (Signed)
Doing well Continue current dose of gabapentin (600 TID)

## 2016-03-09 NOTE — Assessment & Plan Note (Signed)
Can continue to try Viagra - needs to take 60-100mg  each time for effective dose Explained again that insurance does not cover this medication F/u prn

## 2016-03-09 NOTE — Patient Instructions (Signed)
Leg Cramps Introduction Leg cramps occur when a muscle or muscles tighten and you have no control over this tightening (involuntary muscle contraction). Muscle cramps can develop in any muscle, but the most common place is in the calf muscles of the leg. Those cramps can occur during exercise or when you are at rest. Leg cramps are painful, and they may last for a few seconds to a few minutes. Cramps may return several times before they finally stop. Usually, leg cramps are not caused by a serious medical problem. In many cases, the cause is not known. Some common causes include:  Overexertion.  Overuse from repetitive motions, or doing the same thing over and over.  Remaining in a certain position for a long period of time.  Improper preparation, form, or technique while performing a sport or an activity.  Dehydration.  Injury.  Side effects of some medicines.  Abnormally low levels of the salts and ions in your blood (electrolytes), especially potassium and calcium. These levels could be low if you are taking water pills (diuretics) or if you are pregnant. Follow these instructions at home: Watch your condition for any changes. Taking the following actions may help to lessen any discomfort that you are feeling:  Stay well-hydrated. Drink enough fluid to keep your urine clear or pale yellow.  Try massaging, stretching, and relaxing the affected muscle. Do this for several minutes at a time.  For tight or tense muscles, use a warm towel, heating pad, or hot shower water directed to the affected area.  If you are sore or have pain after a cramp, applying ice to the affected area may relieve discomfort.  Put ice in a plastic bag.  Place a towel between your skin and the bag.  Leave the ice on for 20 minutes, 2-3 times per day.  Avoid strenuous exercise for several days if you have been having frequent leg cramps.  Make sure that your diet includes the essential minerals for your  muscles to work normally.  Take medicines only as directed by your health care provider. Contact a health care provider if:  Your leg cramps get more severe or more frequent, or they do not improve over time.  Your foot becomes cold, numb, or blue. This information is not intended to replace advice given to you by your health care provider. Make sure you discuss any questions you have with your health care provider. Document Released: 05/04/2004 Document Revised: 09/02/2015 Document Reviewed: 03/04/2014  2017 Elsevier  

## 2016-03-09 NOTE — Assessment & Plan Note (Signed)
No deformities on exam Check BMET

## 2016-03-09 NOTE — Progress Notes (Deleted)
   Subjective:   Cameron Diaz is a 55 y.o. male with a history of *** here for ***  ***  Review of Systems:  Per HPI.   Social History: *** smoker  Objective:  BP 132/87   Pulse 92   Temp 98.1 F (36.7 C) (Oral)   Ht 5\' 10"  (1.778 m)   Wt 219 lb 12.8 oz (99.7 kg)   BMI 31.54 kg/m   Gen:  55 y.o. male in NAD *** HEENT: NCAT, MMM, EOMI, PERRL, anicteric sclerae CV: RRR, no MRG, no JVD Resp: Non-labored, CTAB, no wheezes noted Abd: Soft, NTND, BS present, no guarding or organomegaly Ext: WWP, no edema MSK: Full ROM, strength intact Neuro: Alert and oriented, speech normal       Chemistry      Component Value Date/Time   NA 146 02/19/2015 1630   K 4.7 02/19/2015 1630   CL 111 (H) 02/19/2015 1630   CO2 25 02/19/2015 1630   BUN 11 02/19/2015 1630   CREATININE 0.95 02/19/2015 1630      Component Value Date/Time   CALCIUM 9.5 02/19/2015 1630   ALKPHOS 65 08/24/2014 1002   AST 38 (H) 08/24/2014 1002   ALT 52 08/24/2014 1002   BILITOT 0.9 08/24/2014 1002      Lab Results  Component Value Date   WBC 7.1 07/22/2014   HGB 16.0 07/22/2014   HCT 47.0 07/22/2014   MCV 86.7 07/22/2014   PLT 223 07/22/2014   Lab Results  Component Value Date   TSH 0.405 09/27/2009   Lab Results  Component Value Date   HGBA1C 5.7 08/30/2015   Assessment & Plan:     Cameron Diaz is a 55 y.o. male here for ***  No problem-specific Assessment & Plan notes found for this encounter.     Virginia Crews, MD MPH PGY-3,  [facility] 03/09/2016  2:10 PM

## 2016-03-09 NOTE — Progress Notes (Signed)
   Subjective:   Cameron Diaz is a 55 y.o. male with a history of HTN, T2DM, HLD, ED, psoriasis here for insomnia nad psoriasis f/u  insomnia - stressful events because his father had to move in with him - gabapentin was increased to 600mg  TID 1 month ago - getting better since increasing gabapentin - no longer wakes in the middle of the night - no daytime fatigue from medications - legs sometimes cramp in the middle of the night  Psoriasis - using clobetasol (on scalp) and triamcinolone (on body) BID - getting a lot better - used to see derm and would like to go back - feels embarrassed and can't wear short sleeves  ED - bought generic viagra 20mg  pills - doesn't think it worked well - was still too expensive  Review of Systems:  Per HPI.   Social History: current smoker  Objective:  BP 132/87   Pulse 92   Temp 98.1 F (36.7 C) (Oral)   Ht 5\' 10"  (1.778 m)   Wt 219 lb 12.8 oz (99.7 kg)   BMI 31.54 kg/m   Gen:  55 y.o. male in NAD HEENT: NCAT, MMM, EOMI, PERRL, anicteric sclerae CV: RRR, no MRG Resp: Non-labored, CTAB, no wheezes noted Ext: WWP, no edema MSK: No obvious deformities, No TTP over legs, gait intact Skin: Large erythematous patch on L forearm, scaling resolved, small patches on scalp that are less erythematous, no scaling Neuro: Alert and oriented, speech normal       Chemistry      Component Value Date/Time   NA 146 02/19/2015 1630   K 4.7 02/19/2015 1630   CL 111 (H) 02/19/2015 1630   CO2 25 02/19/2015 1630   BUN 11 02/19/2015 1630   CREATININE 0.95 02/19/2015 1630      Component Value Date/Time   CALCIUM 9.5 02/19/2015 1630   ALKPHOS 65 08/24/2014 1002   AST 38 (H) 08/24/2014 1002   ALT 52 08/24/2014 1002   BILITOT 0.9 08/24/2014 1002      Lab Results  Component Value Date   WBC 7.1 07/22/2014   HGB 16.0 07/22/2014   HCT 47.0 07/22/2014   MCV 86.7 07/22/2014   PLT 223 07/22/2014   Lab Results  Component Value Date   TSH  0.405 09/27/2009   Lab Results  Component Value Date   HGBA1C 5.7 08/30/2015   Assessment & Plan:     Cameron Diaz is a 55 y.o. male here for   Insomnia Doing well Continue current dose of gabapentin (600 TID)  Leg cramp No deformities on exam Check BMET  Psoriasis Skin is improving with regular application of steroid ointment Again encouraged frequent moisturization and shorter cooler showers Refer to derm for consideration of biologic - though rash likely does not cover enough of body to qualify No joint manifestations  Erectile dysfunction Can continue to try Viagra - needs to take 60-100mg  each time for effective dose Explained again that insurance does not cover this medication F/u prn    Virginia Crews, MD MPH PGY-3,  [facility] 03/09/2016  3:59 PM

## 2016-03-09 NOTE — Assessment & Plan Note (Signed)
Skin is improving with regular application of steroid ointment Again encouraged frequent moisturization and shorter cooler showers Refer to derm for consideration of biologic - though rash likely does not cover enough of body to qualify No joint manifestations

## 2016-03-10 ENCOUNTER — Encounter: Payer: Self-pay | Admitting: Family Medicine

## 2016-03-10 LAB — BASIC METABOLIC PANEL WITH GFR
BUN: 11 mg/dL (ref 7–25)
CO2: 27 mmol/L (ref 20–31)
Calcium: 10 mg/dL (ref 8.6–10.3)
Chloride: 104 mmol/L (ref 98–110)
Creat: 1.09 mg/dL (ref 0.70–1.33)
GFR, EST AFRICAN AMERICAN: 88 mL/min (ref >=60)
GFR, EST NON AFRICAN AMERICAN: 76 mL/min (ref >=60)
GLUCOSE: 105 mg/dL — AB (ref 65–99)
POTASSIUM: 4.2 mmol/L (ref 3.5–5.3)
Sodium: 138 mmol/L (ref 135–146)

## 2016-03-14 ENCOUNTER — Other Ambulatory Visit: Payer: Self-pay | Admitting: Family Medicine

## 2016-04-19 ENCOUNTER — Other Ambulatory Visit: Payer: Self-pay | Admitting: Family Medicine

## 2016-04-19 DIAGNOSIS — L400 Psoriasis vulgaris: Secondary | ICD-10-CM | POA: Diagnosis not present

## 2016-04-19 DIAGNOSIS — L731 Pseudofolliculitis barbae: Secondary | ICD-10-CM | POA: Diagnosis not present

## 2016-04-19 NOTE — Telephone Encounter (Signed)
Needs refill on ativan. [facility] at [place]

## 2016-04-19 NOTE — Telephone Encounter (Signed)
Patient has not been prescribed ativan since 2016, and it was discussed at that time that it would be used short term.  No refill will be given.  Patient will need f/u appt to discuss anxiety.  If any suicidal ideation, needs to go to ED.  Please let patient know. Thanks!  Virginia Crews, MD, MPH PGY-3,  [facility] 04/19/2016 3:47 PM

## 2016-04-19 NOTE — Telephone Encounter (Signed)
Pt informed and scheduled for an appt. Deseree Blount, CMA  

## 2016-05-03 ENCOUNTER — Ambulatory Visit: Payer: PPO | Admitting: Family Medicine

## 2016-05-08 ENCOUNTER — Encounter: Payer: Self-pay | Admitting: Family Medicine

## 2016-05-08 ENCOUNTER — Ambulatory Visit (INDEPENDENT_AMBULATORY_CARE_PROVIDER_SITE_OTHER): Payer: PPO | Admitting: Family Medicine

## 2016-05-08 VITALS — BP 130/82 | HR 99 | Temp 98.4°F | Ht 70.0 in | Wt 227.0 lb

## 2016-05-08 DIAGNOSIS — I10 Essential (primary) hypertension: Secondary | ICD-10-CM

## 2016-05-08 DIAGNOSIS — F329 Major depressive disorder, single episode, unspecified: Secondary | ICD-10-CM | POA: Diagnosis not present

## 2016-05-08 DIAGNOSIS — G4700 Insomnia, unspecified: Secondary | ICD-10-CM

## 2016-05-08 DIAGNOSIS — R109 Unspecified abdominal pain: Secondary | ICD-10-CM | POA: Insufficient documentation

## 2016-05-08 DIAGNOSIS — E118 Type 2 diabetes mellitus with unspecified complications: Secondary | ICD-10-CM

## 2016-05-08 DIAGNOSIS — G894 Chronic pain syndrome: Secondary | ICD-10-CM

## 2016-05-08 DIAGNOSIS — F32A Depression, unspecified: Secondary | ICD-10-CM

## 2016-05-08 DIAGNOSIS — R1084 Generalized abdominal pain: Secondary | ICD-10-CM

## 2016-05-08 HISTORY — DX: Unspecified abdominal pain: R10.9

## 2016-05-08 LAB — COMPLETE METABOLIC PANEL WITH GFR
ALBUMIN: 4.4 g/dL (ref 3.6–5.1)
ALK PHOS: 85 U/L (ref 40–115)
ALT: 37 U/L (ref 9–46)
AST: 31 U/L (ref 10–35)
BILIRUBIN TOTAL: 0.6 mg/dL (ref 0.2–1.2)
BUN: 8 mg/dL (ref 7–25)
CO2: 24 mmol/L (ref 20–31)
Calcium: 9.9 mg/dL (ref 8.6–10.3)
Chloride: 107 mmol/L (ref 98–110)
Creat: 0.88 mg/dL (ref 0.70–1.33)
GFR, Est African American: >89 mL/min (ref >=60)
GFR, Est Non African American: >89 mL/min (ref >=60)
GLUCOSE: 91 mg/dL (ref 65–99)
Potassium: 4 mmol/L (ref 3.5–5.3)
SODIUM: 138 mmol/L (ref 135–146)
TOTAL PROTEIN: 8 g/dL (ref 6.1–8.1)

## 2016-05-08 LAB — POCT GLYCOSYLATED HEMOGLOBIN (HGB A1C): HEMOGLOBIN A1C: 5.4

## 2016-05-08 LAB — LIPID PANEL
CHOL/HDL RATIO: 3.6 ratio (ref <5.0)
Cholesterol: 151 mg/dL (ref <200)
HDL: 42 mg/dL (ref >40)
LDL CALC: 86 mg/dL (ref <100)
Triglycerides: 115 mg/dL (ref <150)
VLDL: 23 mg/dL (ref <30)

## 2016-05-08 MED ORDER — DULOXETINE HCL 30 MG PO CPEP: 30.0000 mg | ORAL_CAPSULE | Freq: Every day | ORAL | 1 refills | Status: DC

## 2016-05-08 NOTE — Progress Notes (Signed)
Subjective:   Cameron Diaz is a 56 y.o. male with a history of HTN, T2DM, psoriasis, chronic pain, isomnia here for multiple concerns  Chief Complaint  Patient presents with  . Medication Refill     abd pain - x3 days - getting better - started in low abd, no seems to be in epigastrium - started with dull ache worse with movement, then got worse - now aching constantly - no previously pain like this - tried pepto-bismol and tylenol - didn't help - had eaten a roast beef sandwich before it started - denies N/V/D/C, fevers - BMs daily  HTN: - Medications: amlodipine 5mg , lisinopril 40mg  daily - Compliance: good - Checking BP at home: no - [facility] 132/101 - Denies any SOB, CP, vision changes, LE edema, medication SEs, or symptoms of hypotension - Diet: varied, adding veggies - Exercise: none currently  Chronic pain - affecting quality of life - hard to go outside with dogs - higher stress with father living with him currently - R posterior hip and b/l legs, esp L knee - denies neck and back pain - Neurosurgery appt on 05/18/16 - gabapentin TID - doesn't think that it helps - chronic intermittent parasthesias in b/l LEs - previously on chronic narcotics from pain management and would like to start back on them - thinks that depressed mood and worry are related to pain and insomnia - was Rx'd Ativan in setting of brother's death in 2014-07-12 and would like to resume taking - denies SI/HI  Insomnia - not sleeping well in general for last year - difficulty getting to sleep - used to have trouble staying asleep because of nocturia - this is better now - watches sports and news before bed - seems to be due to pain - tossing and turning for ~45 min prior to falling asleep - no TV on in room, no lights on, no noise  T2DM - Checking BG at home: No - Medications: Metformin XL 500 mg daily - Compliance: Good - eye exam: 08/2015 - foot exam: 08/2015 -  microalbumin: n/a, ACEi - denies symptoms of hypoglycemia, polyuria, polydipsia, numbness extremities, foot ulcers/trauma   Review of Systems:  Per HPI.   Social History: current smoker  Objective:  BP 130/82   Pulse 99   Temp 98.4 F (36.9 C) (Oral)   Ht 5\' 10"  (1.778 m)   Wt 227 lb (103 kg)   SpO2 96%   BMI 32.57 kg/m   Gen:  56 y.o. male in NAD HEENT: NCAT, MMM, EOMI, PERRL, anicteric sclerae CV: RRR, no MRG Resp: Non-labored, CTAB, no wheezes noted Abd: Soft, ND, mild TTP diffusely, discomfort with contracting abd muscles when laying down or losing legs, BS present, no guarding or organomegaly Ext: WWP, no edema MSK: Tenderness palpation over bilateral SI joints, no midline back tenderness, no paraspinal muscle spasm, decreased flexion of back, negative straight leg raise Neuro: Alert and oriented, speech normal       Chemistry      Component Value Date/Time   NA 138 03/09/2016 1430   K 4.2 03/09/2016 1430   CL 104 03/09/2016 1430   CO2 27 03/09/2016 1430   BUN 11 03/09/2016 1430   CREATININE 1.09 03/09/2016 1430      Component Value Date/Time   CALCIUM 10.0 03/09/2016 1430   ALKPHOS 65 08/24/2014 1002   AST 38 (H) 08/24/2014 1002   ALT 52 08/24/2014 1002   BILITOT 0.9  08/24/2014 1002      Lab Results  Component Value Date   WBC 7.1 07/22/2014   HGB 16.0 07/22/2014   HCT 47.0 07/22/2014   MCV 86.7 07/22/2014   PLT 223 07/22/2014   Lab Results  Component Value Date   TSH 0.405 09/27/2009   Lab Results  Component Value Date   HGBA1C 5.4 05/08/2016   Assessment & Plan:     Cameron Diaz is a 56 y.o. male here for   Essential hypertension Well-controlled today Check electrolytes Continue current medications  Type 2 diabetes mellitus Previously very well controlled Check A1c Continue metformin Follow-up in 6 months  Insomnia Multifactorial etiology Seems to wax and wane with pain and depression Continue gabapentin and add  Cymbalta  Depression Multifactorial with components from chronic pain and life stressors contributing No red flags Starting Cymbalta for chronic pain which will also help mood and sleep Advised that Ativan is not appropriate chronic medication for his depression and anxiety Follow-up in one month and increase Cymbalta dose at that time  Chronic pain Continue gabapentin Start Cymbalta 30 mg daily for chronic pain Referral to pain management place Appreciate neurosurgery recommendations from upcoming appointment Advised patient that narcotics are not good long-term management for his chronic neuropathic pain and we will pursue other options  Abdominal pain Diffuse without focal exam Consider musculoskeletal etiology given diffuse tenderness to light palpation and discomfort when contracting abdominal muscles Seems to be getting better Patient is many years status post appendectomy and history and exam are not consistent with gallbladder pathology Follow-up as needed    Virginia Crews, MD MPH PGY-3,  [facility] 05/08/2016  4:03 PM

## 2016-05-08 NOTE — Assessment & Plan Note (Signed)
Continue gabapentin Start Cymbalta 30 mg daily for chronic pain Referral to pain management place Appreciate neurosurgery recommendations from upcoming appointment Advised patient that narcotics are not good long-term management for his chronic neuropathic pain and we will pursue other options

## 2016-05-08 NOTE — Assessment & Plan Note (Signed)
Well-controlled today Check electrolytes Continue current medications

## 2016-05-08 NOTE — Assessment & Plan Note (Signed)
Multifactorial with components from chronic pain and life stressors contributing No red flags Starting Cymbalta for chronic pain which will also help mood and sleep Advised that Ativan is not appropriate chronic medication for his depression and anxiety Follow-up in one month and increase Cymbalta dose at that time

## 2016-05-08 NOTE — Assessment & Plan Note (Signed)
Previously very well controlled Check A1c Continue metformin Follow-up in 6 months

## 2016-05-08 NOTE — Patient Instructions (Signed)
Nice to see you again today. Your blood pressure is well controlled today. You can continue taking your current medications. We are getting some labs today and someone will call you or send you a letter with the results when they're available.  For your pain, please see neurosurgery as scheduled. I am also referring you to a pain clinic. You should hear about this within the next 2 weeks. Please let us know if you do not hear about the referral. Continue taking gabapentin 3 times a day. Start Cymbalta 30 mg daily. This may also help with your sleep in your mood. If you have any suicidal thoughts, please seek help.  I'll see back in one month.  Take care, Dr. Jacinto Reap

## 2016-05-08 NOTE — Assessment & Plan Note (Signed)
Multifactorial etiology Seems to wax and wane with pain and depression Continue gabapentin and add Cymbalta

## 2016-05-08 NOTE — Assessment & Plan Note (Signed)
Diffuse without focal exam Consider musculoskeletal etiology given diffuse tenderness to light palpation and discomfort when contracting abdominal muscles Seems to be getting better Patient is many years status post appendectomy and history and exam are not consistent with gallbladder pathology Follow-up as needed

## 2016-05-11 ENCOUNTER — Encounter: Payer: Self-pay | Admitting: Family Medicine

## 2016-05-18 DIAGNOSIS — Z6832 Body mass index (BMI) 32.0-32.9, adult: Secondary | ICD-10-CM | POA: Diagnosis not present

## 2016-05-18 DIAGNOSIS — I10 Essential (primary) hypertension: Secondary | ICD-10-CM | POA: Diagnosis not present

## 2016-05-18 DIAGNOSIS — M961 Postlaminectomy syndrome, not elsewhere classified: Secondary | ICD-10-CM | POA: Diagnosis not present

## 2016-05-24 DIAGNOSIS — F332 Major depressive disorder, recurrent severe without psychotic features: Secondary | ICD-10-CM | POA: Diagnosis not present

## 2016-05-25 ENCOUNTER — Other Ambulatory Visit: Payer: Self-pay | Admitting: Family Medicine

## 2016-05-25 NOTE — Telephone Encounter (Signed)
Refill request for 90 day supply.  Martin, Tamika L, RN  

## 2016-06-01 ENCOUNTER — Telehealth: Payer: Self-pay | Admitting: Family Medicine

## 2016-06-01 NOTE — Telephone Encounter (Signed)
Wants to speak with Dr B about his medical records, [place] and colonoscopy.

## 2016-06-02 NOTE — Telephone Encounter (Signed)
Pt calling back to talk with dr b. Please call him back. Cameron Diaz Kennon Holter, CMA

## 2016-06-02 NOTE — Telephone Encounter (Signed)
Called patient back to discuss what questions he may have.  No answer.  Left generic voicemail asking to call back to clinic if questions remain.  Virginia Crews, MD, MPH PGY-3,  [facility] 06/02/2016 9:11 AM

## 2016-06-05 NOTE — Telephone Encounter (Signed)
Called patient back to discuss what questions he may have.  No answer.  Left generic voicemail asking to call back to clinic if questions remain.  Virginia Crews, MD, MPH PGY-3,  [facility] 06/05/2016 8:55 AM

## 2016-06-12 DIAGNOSIS — R200 Anesthesia of skin: Secondary | ICD-10-CM | POA: Diagnosis not present

## 2016-06-12 DIAGNOSIS — M542 Cervicalgia: Secondary | ICD-10-CM | POA: Diagnosis not present

## 2016-06-12 DIAGNOSIS — M79601 Pain in right arm: Secondary | ICD-10-CM | POA: Diagnosis not present

## 2016-06-12 DIAGNOSIS — R202 Paresthesia of skin: Secondary | ICD-10-CM | POA: Diagnosis not present

## 2016-06-13 ENCOUNTER — Encounter: Payer: Self-pay | Admitting: Family Medicine

## 2016-06-13 DIAGNOSIS — M5412 Radiculopathy, cervical region: Secondary | ICD-10-CM | POA: Diagnosis not present

## 2016-06-24 ENCOUNTER — Other Ambulatory Visit: Payer: Self-pay | Admitting: Family Medicine

## 2016-07-04 ENCOUNTER — Other Ambulatory Visit: Payer: Self-pay | Admitting: Neurosurgery

## 2016-07-04 DIAGNOSIS — M5412 Radiculopathy, cervical region: Secondary | ICD-10-CM

## 2016-07-05 DIAGNOSIS — F332 Major depressive disorder, recurrent severe without psychotic features: Secondary | ICD-10-CM | POA: Diagnosis not present

## 2016-07-19 DIAGNOSIS — F332 Major depressive disorder, recurrent severe without psychotic features: Secondary | ICD-10-CM | POA: Diagnosis not present

## 2016-07-20 DIAGNOSIS — F332 Major depressive disorder, recurrent severe without psychotic features: Secondary | ICD-10-CM | POA: Diagnosis not present

## 2016-07-24 ENCOUNTER — Other Ambulatory Visit: Payer: Self-pay | Admitting: *Deleted

## 2016-07-24 NOTE — Telephone Encounter (Signed)
Pharmacy is requesting a 90 day supply. Cameron Diaz, [name], CMA

## 2016-07-25 MED ORDER — LISINOPRIL 40 MG PO TABS: 40.0000 mg | ORAL_TABLET | Freq: Every day | ORAL | 2 refills | Status: DC

## 2016-07-31 ENCOUNTER — Other Ambulatory Visit: Payer: PPO

## 2016-07-31 ENCOUNTER — Inpatient Hospital Stay
Admission: RE | Admit: 2016-07-31 | Discharge: 2016-07-31 | Disposition: A | Discharge status: Home / Self Care | Payer: PPO | Source: Ambulatory Visit | Attending: Neurosurgery | Admitting: Neurosurgery

## 2016-07-31 NOTE — Discharge Instructions (Signed)

## 2016-08-01 ENCOUNTER — Ambulatory Visit: Payer: PPO | Admitting: Family Medicine

## 2016-08-01 DIAGNOSIS — F332 Major depressive disorder, recurrent severe without psychotic features: Secondary | ICD-10-CM | POA: Diagnosis not present

## 2016-08-01 NOTE — Progress Notes (Deleted)
   Subjective:   Cameron Diaz is a 56 y.o. male with a history of Chronic constipation, chronic back pain, T2 DM, HTN, psoriasis here for ***  ***  Review of Systems:  Per HPI.   Social History: *** smoker  Objective:  There were no vitals taken for this visit.  Gen:  56 y.o. male in NAD *** HEENT: NCAT, MMM, EOMI, PERRL, anicteric sclerae CV: RRR, no MRG, no JVD Resp: Non-labored, CTAB, no wheezes noted Abd: Soft, NTND, BS present, no guarding or organomegaly Ext: WWP, no edema MSK: Full ROM, strength intact Neuro: Alert and oriented, speech normal       Chemistry      Component Value Date/Time   NA 138 05/08/2016 1521   K 4.0 05/08/2016 1521   CL 107 05/08/2016 1521   CO2 24 05/08/2016 1521   BUN 8 05/08/2016 1521   CREATININE 0.88 05/08/2016 1521      Component Value Date/Time   CALCIUM 9.9 05/08/2016 1521   ALKPHOS 85 05/08/2016 1521   AST 31 05/08/2016 1521   ALT 37 05/08/2016 1521   BILITOT 0.6 05/08/2016 1521      Lab Results  Component Value Date   WBC 7.1 07/22/2014   HGB 16.0 07/22/2014   HCT 47.0 07/22/2014   MCV 86.7 07/22/2014   PLT 223 07/22/2014   Lab Results  Component Value Date   TSH 0.405 09/27/2009   Lab Results  Component Value Date   HGBA1C 5.4 05/08/2016   Assessment & Plan:     Cameron Diaz is a 56 y.o. male here for ***  No problem-specific Assessment & Plan notes found for this encounter.     Virginia Crews, MD MPH PGY-3,  [facility] 08/01/2016  1:41 PM

## 2016-08-09 ENCOUNTER — Ambulatory Visit
Admission: RE | Admit: 2016-08-09 | Discharge: 2016-08-09 | Disposition: A | Discharge status: Home / Self Care | Payer: PPO | Source: Ambulatory Visit | Attending: Neurosurgery | Admitting: Neurosurgery

## 2016-08-09 DIAGNOSIS — M5412 Radiculopathy, cervical region: Secondary | ICD-10-CM

## 2016-08-09 DIAGNOSIS — M4802 Spinal stenosis, cervical region: Secondary | ICD-10-CM | POA: Diagnosis not present

## 2016-08-09 MED ORDER — ONDANSETRON HCL 4 MG/2ML IJ SOLN
4.0000 mg | Freq: Once | INTRAMUSCULAR | Status: AC
  Administered 2016-08-09: 4 mg via INTRAMUSCULAR

## 2016-08-09 MED ORDER — DIAZEPAM 5 MG PO TABS
10.0000 mg | ORAL_TABLET | Freq: Once | ORAL | Status: AC
  Administered 2016-08-09: 10 mg via ORAL

## 2016-08-09 MED ORDER — ONDANSETRON HCL 4 MG/2ML IJ SOLN: 4.0000 mg | Freq: Four times a day (QID) | INTRAMUSCULAR | Status: DC | PRN

## 2016-08-09 MED ORDER — MEPERIDINE HCL 100 MG/ML IJ SOLN
100.0000 mg | Freq: Once | INTRAMUSCULAR | Status: AC
  Administered 2016-08-09: 100 mg via INTRAMUSCULAR

## 2016-08-09 MED ORDER — IOPAMIDOL (ISOVUE-M 300) INJECTION 61%
10.0000 mL | Freq: Once | INTRAMUSCULAR | Status: AC | PRN
  Administered 2016-08-09: 10 mL via INTRATHECAL

## 2016-08-09 NOTE — Discharge Instructions (Signed)

## 2016-08-16 DIAGNOSIS — I10 Essential (primary) hypertension: Secondary | ICD-10-CM | POA: Diagnosis not present

## 2016-08-16 DIAGNOSIS — Z6830 Body mass index (BMI) 30.0-30.9, adult: Secondary | ICD-10-CM | POA: Diagnosis not present

## 2016-08-16 DIAGNOSIS — M4802 Spinal stenosis, cervical region: Secondary | ICD-10-CM | POA: Diagnosis not present

## 2016-08-18 ENCOUNTER — Other Ambulatory Visit: Payer: Self-pay | Admitting: Family Medicine

## 2016-08-18 DIAGNOSIS — E118 Type 2 diabetes mellitus with unspecified complications: Secondary | ICD-10-CM

## 2016-08-23 DIAGNOSIS — H40013 Open angle with borderline findings, low risk, bilateral: Secondary | ICD-10-CM | POA: Diagnosis not present

## 2016-08-23 DIAGNOSIS — E119 Type 2 diabetes mellitus without complications: Secondary | ICD-10-CM | POA: Diagnosis not present

## 2016-08-23 DIAGNOSIS — H2513 Age-related nuclear cataract, bilateral: Secondary | ICD-10-CM | POA: Diagnosis not present

## 2016-08-23 DIAGNOSIS — H5052 Exophoria: Secondary | ICD-10-CM | POA: Diagnosis not present

## 2016-08-23 LAB — HM DIABETES EYE EXAM

## 2016-09-12 ENCOUNTER — Ambulatory Visit (HOSPITAL_COMMUNITY)
Admission: EM | Admit: 2016-09-12 | Discharge: 2016-09-12 | Disposition: A | Discharge status: Home / Self Care | Payer: PPO | Attending: Internal Medicine | Admitting: Internal Medicine

## 2016-09-12 ENCOUNTER — Encounter (HOSPITAL_COMMUNITY): Payer: Self-pay | Admitting: Family Medicine

## 2016-09-12 DIAGNOSIS — J019 Acute sinusitis, unspecified: Secondary | ICD-10-CM | POA: Diagnosis not present

## 2016-09-12 DIAGNOSIS — B9689 Other specified bacterial agents as the cause of diseases classified elsewhere: Secondary | ICD-10-CM | POA: Diagnosis not present

## 2016-09-12 DIAGNOSIS — R059 Cough, unspecified: Secondary | ICD-10-CM

## 2016-09-12 DIAGNOSIS — R05 Cough: Secondary | ICD-10-CM

## 2016-09-12 MED ORDER — FLUTICASONE PROPIONATE 50 MCG/ACT NA SUSP: 2.0000 | Freq: Every day | NASAL | 0 refills | Status: DC

## 2016-09-12 MED ORDER — AMOXICILLIN-POT CLAVULANATE 875-125 MG PO TABS: 1.0000 | ORAL_TABLET | Freq: Two times a day (BID) | ORAL | 0 refills | Status: DC

## 2016-09-12 NOTE — Discharge Instructions (Addendum)
Anticipate gradual improvement in congestion/cough over the next several days.  Prescription for amoxicillin/clavulanate (antibiotic) and fluticasone nasal steroid spray (for congestion/drainage) were sent to the pharmacy.  Recheck for new fever >100.5, increasing phlegm production/nasal discharge, or if not starting to improve in a few days.
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
## 2016-09-12 NOTE — ED Triage Notes (Signed)
Pt here for URI symptoms. sts everybody else at home is sick.

## 2016-09-17 ENCOUNTER — Other Ambulatory Visit: Payer: Self-pay | Admitting: Family Medicine

## 2016-09-21 DIAGNOSIS — F332 Major depressive disorder, recurrent severe without psychotic features: Secondary | ICD-10-CM | POA: Diagnosis not present

## 2016-09-28 ENCOUNTER — Encounter: Payer: Self-pay | Admitting: Family Medicine

## 2017-01-06 ENCOUNTER — Encounter (HOSPITAL_COMMUNITY): Payer: Self-pay | Admitting: Emergency Medicine

## 2017-01-06 DIAGNOSIS — I10 Essential (primary) hypertension: Secondary | ICD-10-CM | POA: Insufficient documentation

## 2017-01-06 DIAGNOSIS — H6690 Otitis media, unspecified, unspecified ear: Secondary | ICD-10-CM | POA: Insufficient documentation

## 2017-01-06 DIAGNOSIS — Z79899 Other long term (current) drug therapy: Secondary | ICD-10-CM | POA: Insufficient documentation

## 2017-01-06 DIAGNOSIS — Z7984 Long term (current) use of oral hypoglycemic drugs: Secondary | ICD-10-CM | POA: Diagnosis not present

## 2017-01-06 DIAGNOSIS — Z87891 Personal history of nicotine dependence: Secondary | ICD-10-CM | POA: Diagnosis not present

## 2017-01-06 DIAGNOSIS — H6691 Otitis media, unspecified, right ear: Secondary | ICD-10-CM | POA: Diagnosis not present

## 2017-01-06 DIAGNOSIS — H9201 Otalgia, right ear: Secondary | ICD-10-CM | POA: Diagnosis present

## 2017-01-06 DIAGNOSIS — E119 Type 2 diabetes mellitus without complications: Secondary | ICD-10-CM | POA: Diagnosis not present

## 2017-01-06 DIAGNOSIS — J0101 Acute recurrent maxillary sinusitis: Secondary | ICD-10-CM | POA: Insufficient documentation
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
## 2017-01-07 ENCOUNTER — Emergency Department (HOSPITAL_COMMUNITY)
Admission: EM | Admit: 2017-01-07 | Discharge: 2017-01-07 | Disposition: A | Discharge status: Home / Self Care | Payer: PPO | Attending: Emergency Medicine | Admitting: Emergency Medicine

## 2017-01-07 DIAGNOSIS — J0101 Acute recurrent maxillary sinusitis: Secondary | ICD-10-CM

## 2017-01-07 DIAGNOSIS — H6690 Otitis media, unspecified, unspecified ear: Secondary | ICD-10-CM

## 2017-01-07 MED ORDER — AMOXICILLIN-POT CLAVULANATE 875-125 MG PO TABS: 1.0000 | ORAL_TABLET | Freq: Two times a day (BID) | ORAL | 0 refills | Status: DC

## 2017-01-07 MED ORDER — FLUTICASONE PROPIONATE 50 MCG/ACT NA SUSP: 2.0000 | Freq: Every day | NASAL | 2 refills | Status: DC

## 2017-01-07 NOTE — Discharge Instructions (Signed)
Take Augmentin as prescribed until all gone. Use Flonase for congestion.Use intranasal saline to help with mucus. Follow up with family doctor next week for recheck
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
## 2017-01-24 ENCOUNTER — Other Ambulatory Visit: Payer: Self-pay | Admitting: Family Medicine

## 2017-01-24 NOTE — Telephone Encounter (Signed)
Will forward to new PCP  Bacigalupo, Dionne Bucy, MD, MPH [facility] 01/24/2017 2:16 PM

## 2017-02-26 ENCOUNTER — Ambulatory Visit (INDEPENDENT_AMBULATORY_CARE_PROVIDER_SITE_OTHER): Payer: PPO | Admitting: Family Medicine

## 2017-02-26 ENCOUNTER — Other Ambulatory Visit: Payer: Self-pay

## 2017-02-26 VITALS — BP 140/90 | HR 93 | Temp 98.4°F | Ht 70.0 in | Wt 243.6 lb

## 2017-02-26 DIAGNOSIS — R600 Localized edema: Secondary | ICD-10-CM | POA: Diagnosis not present

## 2017-02-26 DIAGNOSIS — Z23 Encounter for immunization: Secondary | ICD-10-CM | POA: Diagnosis not present

## 2017-02-26 DIAGNOSIS — Z1211 Encounter for screening for malignant neoplasm of colon: Secondary | ICD-10-CM

## 2017-02-26 MED ORDER — FUROSEMIDE 20 MG PO TABS: 20.0000 mg | ORAL_TABLET | Freq: Every day | ORAL | 0 refills | Status: DC

## 2017-02-26 NOTE — Patient Instructions (Signed)
It was a pleasure to see you today! Thank you for choosing [facility] for your primary care. Cameron Diaz was seen for lower extremity edema, drowsiness.   Our plans for today were:  You have fluid building up in your legs, possibly due to your heart not pumping as well as it should. I ordered an ultrasound to check on your heart.   Take the lasix (fluid pill) once per day at 20mg . If you don't notice increased urination in the next 3-4 hours after taking it, take 40mg  (2 pills) the next day. See Dr. Avon Gully in 2 weeks to recheck your legs.    You should return to our clinic to see Dr. Avon Gully in 2 weeks for lower extremity swelling.   Best,  Dr. Lindell Noe
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
## 2017-02-27 LAB — CMP14+EGFR
ALBUMIN: 4.3 g/dL (ref 3.5–5.5)
ALT: 47 IU/L — ABNORMAL HIGH (ref 0–44)
AST: 43 IU/L — ABNORMAL HIGH (ref 0–40)
Albumin/Globulin Ratio: 1.3 (ref 1.2–2.2)
Alkaline Phosphatase: 110 IU/L (ref 39–117)
BILIRUBIN TOTAL: 0.4 mg/dL (ref 0.0–1.2)
BUN/Creatinine Ratio: 12 (ref 9–20)
BUN: 10 mg/dL (ref 6–24)
CO2: 24 mmol/L (ref 20–29)
CREATININE: 0.85 mg/dL (ref 0.76–1.27)
Calcium: 9.4 mg/dL (ref 8.7–10.2)
Chloride: 104 mmol/L (ref 96–106)
GFR calc Af Amer: 113 mL/min/{1.73_m2} (ref >59)
GFR calc non Af Amer: 97 mL/min/{1.73_m2} (ref >59)
GLUCOSE: 89 mg/dL (ref 65–99)
Globulin, Total: 3.2 g/dL (ref 1.5–4.5)
POTASSIUM: 4.3 mmol/L (ref 3.5–5.2)
SODIUM: 142 mmol/L (ref 134–144)
TOTAL PROTEIN: 7.5 g/dL (ref 6.0–8.5)

## 2017-02-27 LAB — CBC
Hematocrit: 43.2 % (ref 37.5–51.0)
Hemoglobin: 15.2 g/dL (ref 13.0–17.7)
MCH: 30 pg (ref 26.6–33.0)
MCHC: 35.2 g/dL (ref 31.5–35.7)
MCV: 85 fL (ref 79–97)
Platelets: 233 10*3/uL (ref 150–379)
RBC: 5.06 x10E6/uL (ref 4.14–5.80)
RDW: 13.7 % (ref 12.3–15.4)
WBC: 6.5 10*3/uL (ref 3.4–10.8)

## 2017-02-27 LAB — TSH: TSH: 0.689 u[IU]/mL (ref 0.450–4.500)

## 2017-02-28 DIAGNOSIS — R600 Localized edema: Secondary | ICD-10-CM

## 2017-02-28 HISTORY — DX: Localized edema: R60.0

## 2017-02-28 NOTE — Assessment & Plan Note (Addendum)
Broad differential including hypothyroidism, anemia, cardiac, renal.  Quiet S3 heard on exam, suspect cardiac etiology.  Will give trial of 20 daily Lasix for 1 week, instructed patient to increase to 40 daily if he does not see significant increase in urine output.  Patient to follow-up with PCP in 1-2 weeks, go to ED if respiratory symptoms.  CBC, TSH, CMP, echo.

## 2017-02-28 NOTE — Assessment & Plan Note (Signed)
Now with excessive daytime drowsiness. Unlikely hypercarbia as patient is breathing normally. Will check bicarb on CMP. Less likely meds as no recent changes. Instructed him to cease driving. PCP may want to consider repeating sleep study.

## 2017-03-07 ENCOUNTER — Telehealth: Payer: Self-pay | Admitting: *Deleted

## 2017-03-07 NOTE — Telephone Encounter (Signed)
-----   Message from Sela Hilding, MD sent at 02/28/2017 10:51 AM EST ----- I saw this patient Monday and ordered an echo. I think I completely forgot to tell you this, and therefore we didn't get it scheduled. Could you schedule it and call him with the time? Needs to be in the next 2 weeks, I think he has new onset CHF. Thanks so much, sorry I forgot

## 2017-03-07 NOTE — Telephone Encounter (Signed)
Echo lab already called and scheduled pt. Grae Leathers Kennon Holter, CMA

## 2017-03-12 ENCOUNTER — Ambulatory Visit: Payer: PPO | Admitting: Internal Medicine

## 2017-03-13 ENCOUNTER — Ambulatory Visit (HOSPITAL_COMMUNITY)
Admission: RE | Admit: 2017-03-13 | Discharge: 2017-03-13 | Disposition: A | Discharge status: Home / Self Care | Payer: PPO | Source: Ambulatory Visit | Attending: Family Medicine | Admitting: Family Medicine

## 2017-03-13 DIAGNOSIS — E119 Type 2 diabetes mellitus without complications: Secondary | ICD-10-CM | POA: Diagnosis not present

## 2017-03-13 DIAGNOSIS — R600 Localized edema: Secondary | ICD-10-CM | POA: Diagnosis not present

## 2017-03-13 DIAGNOSIS — I10 Essential (primary) hypertension: Secondary | ICD-10-CM | POA: Insufficient documentation

## 2017-03-13 NOTE — Progress Notes (Signed)
  Echocardiogram 2D Echocardiogram has been performed.  Cameron Diaz 03/13/2017, 3:48 PM

## 2017-03-14 ENCOUNTER — Encounter: Payer: Self-pay | Admitting: Family Medicine

## 2017-03-21 ENCOUNTER — Telehealth: Payer: Self-pay | Admitting: *Deleted

## 2017-03-21 NOTE — Telephone Encounter (Signed)
Received call from [facility] stating patient had cancelled colonoscopy without rescheduling due to difficulties with insurance. Hubbard Hartshorn, RN, BSN

## 2017-05-22 DIAGNOSIS — M9981 Other biomechanical lesions of cervical region: Secondary | ICD-10-CM | POA: Diagnosis not present

## 2017-05-22 DIAGNOSIS — F1120 Opioid dependence, uncomplicated: Secondary | ICD-10-CM | POA: Diagnosis not present

## 2017-05-22 DIAGNOSIS — M5412 Radiculopathy, cervical region: Secondary | ICD-10-CM | POA: Diagnosis not present

## 2017-05-22 DIAGNOSIS — M961 Postlaminectomy syndrome, not elsewhere classified: Secondary | ICD-10-CM | POA: Diagnosis not present

## 2017-05-28 DIAGNOSIS — M9981 Other biomechanical lesions of cervical region: Secondary | ICD-10-CM | POA: Diagnosis not present

## 2017-05-28 DIAGNOSIS — M961 Postlaminectomy syndrome, not elsewhere classified: Secondary | ICD-10-CM | POA: Diagnosis not present

## 2017-05-28 DIAGNOSIS — M5412 Radiculopathy, cervical region: Secondary | ICD-10-CM | POA: Diagnosis not present

## 2017-05-28 DIAGNOSIS — F1120 Opioid dependence, uncomplicated: Secondary | ICD-10-CM | POA: Diagnosis not present

## 2017-06-11 ENCOUNTER — Ambulatory Visit (INDEPENDENT_AMBULATORY_CARE_PROVIDER_SITE_OTHER): Payer: PPO | Admitting: Internal Medicine

## 2017-06-11 ENCOUNTER — Other Ambulatory Visit: Payer: Self-pay

## 2017-06-11 ENCOUNTER — Encounter: Payer: Self-pay | Admitting: Family Medicine

## 2017-06-11 ENCOUNTER — Encounter: Payer: Self-pay | Admitting: Internal Medicine

## 2017-06-11 VITALS — BP 126/80 | HR 93 | Temp 97.6°F | Ht 70.0 in | Wt 252.0 lb

## 2017-06-11 DIAGNOSIS — I10 Essential (primary) hypertension: Secondary | ICD-10-CM | POA: Diagnosis not present

## 2017-06-11 DIAGNOSIS — Z0001 Encounter for general adult medical examination with abnormal findings: Secondary | ICD-10-CM

## 2017-06-11 DIAGNOSIS — E669 Obesity, unspecified: Secondary | ICD-10-CM

## 2017-06-11 DIAGNOSIS — E119 Type 2 diabetes mellitus without complications: Secondary | ICD-10-CM | POA: Diagnosis not present

## 2017-06-11 DIAGNOSIS — G894 Chronic pain syndrome: Secondary | ICD-10-CM | POA: Diagnosis not present

## 2017-06-11 DIAGNOSIS — E66811 Obesity, class 1: Secondary | ICD-10-CM

## 2017-06-11 LAB — POCT GLYCOSYLATED HEMOGLOBIN (HGB A1C): HEMOGLOBIN A1C: 6.2

## 2017-06-11 MED ORDER — AMLODIPINE BESYLATE 5 MG PO TABS: 5.0000 mg | ORAL_TABLET | Freq: Every day | ORAL | 3 refills | Status: DC

## 2017-06-11 MED ORDER — FLUTICASONE PROPIONATE 50 MCG/ACT NA SUSP: 2.0000 | Freq: Every day | NASAL | 1 refills | Status: DC

## 2017-06-11 MED ORDER — LISINOPRIL 40 MG PO TABS: 40.0000 mg | ORAL_TABLET | Freq: Every day | ORAL | 3 refills | Status: DC

## 2017-06-11 MED ORDER — METFORMIN HCL ER 500 MG PO TB24: 500.0000 mg | ORAL_TABLET | Freq: Every day | ORAL | 3 refills | Status: DC

## 2017-06-11 MED ORDER — FLUTICASONE PROPIONATE 50 MCG/ACT NA SUSP: 2.0000 | Freq: Every day | NASAL | 2 refills | Status: DC

## 2017-06-11 NOTE — Assessment & Plan Note (Signed)
Now taking only Norco for pain, though has been without for past three weeks. Reportedly can no longer be seen at previous pain management clinic due to insurance reasons. Do not feel that narcotics are a good long-term solution to patient's pain, however he would like to continue to be managed at a pain clinic, so will place referral. No narcotics or other pain medications prescribed today. Of note, patient sits, stands, and ambulates very well without assistance and in no acute distress throughout visit.

## 2017-06-11 NOTE — Patient Instructions (Addendum)
It was nice meeting you today Cameron Diaz!  I have placed your referral to a pain management clinic. You will be called with the date and time of this appointment.   I will call you if there are any abnormalities with your lab results.   We will see you back in one year for your next physical, or sooner if needed.   If you have any questions or concerns, please feel free to call the clinic.   Be well,  Dr. Avon Gully

## 2017-06-11 NOTE — Assessment & Plan Note (Signed)
Well-controlled. A1C 6.2 today, slightly up from last year though still within goal range.  - Continue metformin
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
## 2017-06-11 NOTE — Assessment & Plan Note (Signed)
BP at goal at 126/80 today. Will not repeat BMP as recently obtained in 02/2017.  - Continue lisinopril, amlodipine

## 2017-06-12 LAB — LIPID PANEL
CHOL/HDL RATIO: 3.2 ratio (ref 0.0–5.0)
Cholesterol, Total: 174 mg/dL (ref 100–199)
HDL: 55 mg/dL (ref >39)
LDL CALC: 101 mg/dL — AB (ref 0–99)
Triglycerides: 91 mg/dL (ref 0–149)
VLDL CHOLESTEROL CAL: 18 mg/dL (ref 5–40)

## 2017-07-09 ENCOUNTER — Encounter: Payer: Self-pay | Admitting: Internal Medicine

## 2017-07-09 ENCOUNTER — Telehealth: Payer: Self-pay | Admitting: Internal Medicine

## 2017-07-09 NOTE — Telephone Encounter (Signed)
Dr. Carlean Purl reviewed records. Patient is scheduled for a repeat colon on 09/21/17 @ 11:00am.

## 2017-07-09 NOTE — Telephone Encounter (Signed)
We have received a referral from pt's PCP on 07/06/17 pm requesting pt being seen for a repeat colon. Records have been received and placed on 07/06/17 pm DOD Dr. Celesta Aver desk for review.

## 2017-07-18 ENCOUNTER — Encounter: Payer: Self-pay | Admitting: Physical Medicine & Rehabilitation

## 2017-08-09 ENCOUNTER — Encounter: Payer: PPO | Attending: Physical Medicine & Rehabilitation | Admitting: Physical Medicine & Rehabilitation

## 2017-08-09 ENCOUNTER — Encounter: Payer: Self-pay | Admitting: Physical Medicine & Rehabilitation

## 2017-08-09 VITALS — BP 131/88 | HR 87 | Ht 71.0 in | Wt 246.0 lb

## 2017-08-09 DIAGNOSIS — G8929 Other chronic pain: Secondary | ICD-10-CM | POA: Insufficient documentation

## 2017-08-09 DIAGNOSIS — R269 Unspecified abnormalities of gait and mobility: Secondary | ICD-10-CM | POA: Insufficient documentation

## 2017-08-09 DIAGNOSIS — Z87891 Personal history of nicotine dependence: Secondary | ICD-10-CM | POA: Insufficient documentation

## 2017-08-09 DIAGNOSIS — G479 Sleep disorder, unspecified: Secondary | ICD-10-CM | POA: Diagnosis not present

## 2017-08-09 DIAGNOSIS — G894 Chronic pain syndrome: Secondary | ICD-10-CM | POA: Diagnosis not present

## 2017-08-09 DIAGNOSIS — G40909 Epilepsy, unspecified, not intractable, without status epilepticus: Secondary | ICD-10-CM | POA: Insufficient documentation

## 2017-08-09 DIAGNOSIS — I10 Essential (primary) hypertension: Secondary | ICD-10-CM | POA: Diagnosis not present

## 2017-08-09 DIAGNOSIS — M791 Myalgia, unspecified site: Secondary | ICD-10-CM | POA: Diagnosis not present

## 2017-08-09 DIAGNOSIS — Z5181 Encounter for therapeutic drug level monitoring: Secondary | ICD-10-CM | POA: Diagnosis not present

## 2017-08-09 DIAGNOSIS — E119 Type 2 diabetes mellitus without complications: Secondary | ICD-10-CM | POA: Diagnosis not present

## 2017-08-09 MED ORDER — METHOCARBAMOL 500 MG PO TABS: 500.0000 mg | ORAL_TABLET | Freq: Two times a day (BID) | ORAL | 1 refills | Status: DC | PRN

## 2017-08-09 MED ORDER — MELOXICAM 15 MG PO TABS: 15.0000 mg | ORAL_TABLET | Freq: Every day | ORAL | 1 refills | Status: DC

## 2017-08-09 MED ORDER — DULOXETINE HCL 30 MG PO CPEP: 30.0000 mg | ORAL_CAPSULE | Freq: Every day | ORAL | 1 refills | Status: DC

## 2017-08-09 MED ORDER — AMITRIPTYLINE HCL 10 MG PO TABS: 10.0000 mg | ORAL_TABLET | Freq: Every day | ORAL | 1 refills | Status: DC
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
## 2017-08-09 NOTE — Addendum Note (Signed)
Addended by: Marland Mcalpine B on: 08/09/2017 02:51 PM   Modules accepted: Orders

## 2017-08-09 NOTE — Progress Notes (Signed)
UDS cancelled by provider.

## 2017-09-05 ENCOUNTER — Other Ambulatory Visit: Payer: Self-pay

## 2017-09-05 ENCOUNTER — Ambulatory Visit (AMBULATORY_SURGERY_CENTER): Payer: Self-pay

## 2017-09-05 VITALS — Ht 70.0 in | Wt 248.4 lb

## 2017-09-05 DIAGNOSIS — Z86010 Personal history of colonic polyps: Secondary | ICD-10-CM

## 2017-09-05 NOTE — Progress Notes (Signed)
Denies allergies to eggs or soy products. Denies complication of anesthesia or sedation. Denies use of weight loss medication. Denies use of O2.   Emmi instructions declined.  

## 2017-09-06 ENCOUNTER — Encounter: Payer: Self-pay | Admitting: Physical Medicine & Rehabilitation

## 2017-09-06 ENCOUNTER — Other Ambulatory Visit: Payer: Self-pay

## 2017-09-06 ENCOUNTER — Encounter: Payer: PPO | Admitting: Physical Medicine & Rehabilitation

## 2017-09-06 ENCOUNTER — Encounter (HOSPITAL_BASED_OUTPATIENT_CLINIC_OR_DEPARTMENT_OTHER): Payer: PPO | Admitting: Physical Medicine & Rehabilitation

## 2017-09-06 VITALS — BP 125/85 | HR 82 | Ht 70.0 in | Wt 249.6 lb

## 2017-09-06 DIAGNOSIS — G479 Sleep disorder, unspecified: Secondary | ICD-10-CM | POA: Diagnosis not present

## 2017-09-06 DIAGNOSIS — M791 Myalgia, unspecified site: Secondary | ICD-10-CM

## 2017-09-06 DIAGNOSIS — G894 Chronic pain syndrome: Secondary | ICD-10-CM | POA: Diagnosis not present

## 2017-09-06 DIAGNOSIS — R269 Unspecified abnormalities of gait and mobility: Secondary | ICD-10-CM

## 2017-09-06 DIAGNOSIS — G8929 Other chronic pain: Secondary | ICD-10-CM | POA: Diagnosis not present

## 2017-09-06 MED ORDER — DULOXETINE HCL 60 MG PO CPEP
60.0000 mg | ORAL_CAPSULE | Freq: Every day | ORAL | 1 refills | Status: DC
Start: 2017-09-06 — End: 2017-10-10

## 2017-09-06 MED ORDER — NORTRIPTYLINE HCL 10 MG PO CAPS: 10.0000 mg | ORAL_CAPSULE | Freq: Every day | ORAL | 1 refills | Status: DC

## 2017-09-06 MED ORDER — BACLOFEN 10 MG PO TABS: 10.0000 mg | ORAL_TABLET | Freq: Three times a day (TID) | ORAL | 1 refills | Status: DC | PRN
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
## 2017-09-14 ENCOUNTER — Encounter: Payer: Self-pay | Admitting: Internal Medicine

## 2017-09-21 ENCOUNTER — Other Ambulatory Visit: Payer: Self-pay

## 2017-09-21 ENCOUNTER — Encounter: Payer: Self-pay | Admitting: Internal Medicine

## 2017-09-21 ENCOUNTER — Ambulatory Visit (AMBULATORY_SURGERY_CENTER): Payer: PPO | Admitting: Internal Medicine

## 2017-09-21 VITALS — BP 111/72 | HR 84 | Temp 99.5°F | Resp 24 | Ht 70.0 in | Wt 248.0 lb

## 2017-09-21 DIAGNOSIS — D124 Benign neoplasm of descending colon: Secondary | ICD-10-CM | POA: Diagnosis not present

## 2017-09-21 DIAGNOSIS — Z86010 Personal history of colonic polyps: Secondary | ICD-10-CM | POA: Diagnosis not present

## 2017-09-21 DIAGNOSIS — I10 Essential (primary) hypertension: Secondary | ICD-10-CM | POA: Diagnosis not present

## 2017-09-21 DIAGNOSIS — G894 Chronic pain syndrome: Secondary | ICD-10-CM | POA: Diagnosis not present

## 2017-09-21 DIAGNOSIS — D123 Benign neoplasm of transverse colon: Secondary | ICD-10-CM

## 2017-09-21 DIAGNOSIS — E119 Type 2 diabetes mellitus without complications: Secondary | ICD-10-CM | POA: Diagnosis not present

## 2017-09-21 DIAGNOSIS — D125 Benign neoplasm of sigmoid colon: Secondary | ICD-10-CM | POA: Diagnosis not present

## 2017-09-21 DIAGNOSIS — K635 Polyp of colon: Secondary | ICD-10-CM | POA: Diagnosis not present

## 2017-09-21 DIAGNOSIS — Z800 Family history of malignant neoplasm of digestive organs: Secondary | ICD-10-CM | POA: Insufficient documentation

## 2017-09-21 DIAGNOSIS — R569 Unspecified convulsions: Secondary | ICD-10-CM | POA: Diagnosis not present

## 2017-09-21 HISTORY — DX: Family history of malignant neoplasm of digestive organs: Z80.0

## 2017-09-21 MED ORDER — SODIUM CHLORIDE 0.9 % IV SOLN: 500.0000 mL | Freq: Once | INTRAVENOUS | Status: DC

## 2017-09-21 NOTE — Op Note (Addendum)
[facility] Patient Name: Cameron Diaz Procedure Date: 09/21/2017 11:15 AM MRN: [id number] Endoscopist: Gatha Mayer , MD Age: 57 Referring MD:  Date of Birth: [birth year] Gender: Male Account #: [id number] Procedure:                Colonoscopy Indications:              High risk colon cancer surveillance: Personal                            history of colonic polyps Medicines:                Propofol per Anesthesia, Monitored Anesthesia Care Procedure:                Pre-Anesthesia Assessment:                           - Prior to the procedure, a History and Physical                            was performed, and patient medications and                            allergies were reviewed. The patient's tolerance of                            previous anesthesia was also reviewed. The risks                            and benefits of the procedure and the sedation                            options and risks were discussed with the patient.                            All questions were answered, and informed consent                            was obtained. Prior Anticoagulants: The patient                            last took previous NSAID medication 1 day prior to                            the procedure. ASA Grade Assessment: III - A                            patient with severe systemic disease. After                            reviewing the risks and benefits, the patient was                            deemed in satisfactory condition to undergo the  procedure.                           After obtaining informed consent, the colonoscope                            was passed under direct vision. Throughout the                            procedure, the patient's blood pressure, pulse, and                            oxygen saturations were monitored continuously. The                            Colonoscope was introduced through the anus and                             advanced to the the cecum, identified by                            appendiceal orifice and ileocecal valve. The                            patient tolerated the procedure well. The                            colonoscopy was somewhat difficult due to spasm and                            position for polypectomy. The quality of the bowel                            preparation was good. The ileocecal valve,                            appendiceal orifice, and rectum were photographed.                            The bowel preparation used was Miralax. Scope In: 11:23:56 AM Scope Out: 11:59:55 AM Scope Withdrawal Time: 0 hours 33 minutes 10 seconds  Total Procedure Duration: 0 hours 35 minutes 59 seconds  Findings:                 The perianal and digital rectal examinations were                            normal.                           A 20+ mm polyp was found in the transverse colon.                            The polyp was sessile. The polyp was removed with a  hot snare, The polyp was removed with a piecemeal                            technique using a hot snare, The polyp was removed                            with a cold snare and The polyp was removed with a                            piecemeal technique using a cold snare. Resection                            and retrieval were complete using a suction (via                            the working channel). [Clip Device]. Verification                            of patient identification for the specimen was                            done. Estimated blood loss was minimal. Area was                            tattooed with an injection of 4 mL of Spot (carbon                            black). Estimated blood loss: none. tattoo is 3 cm                            distal and diagonal to polypectomy site (see photo)                           Five sessile polyps were found in the sigmoid colon                             and descending colon. The polyps were 5 to 8 mm in                            size. These polyps were removed with a cold snare.                            Resection and retrieval were complete. Verification                            of patient identification for the specimen was                            done. Estimated blood loss was minimal.                           Multiple flat,  hyperplastic and sessile,                            non-bleeding polyps were found in the sigmoid                            colon. The polyps were diminutive in size.                           The exam was otherwise without abnormality on                            direct and retroflexion views. Complications:            No immediate complications. Estimated Blood Loss:     Estimated blood loss was minimal. Impression:               - One 20 mm polyp in the transverse colon, removed                            with a hot snare, removed piecemeal using a hot                            snare, removed with a cold snare and removed                            piecemeal using a cold snare. Resected and                            retrieved. Tattooed.                           - Five 5 to 8 mm polyps in the sigmoid colon and in                            the descending colon, removed with a cold snare.                            Resected and retrieved.                           - Multiple diminutive, non-bleeding polyps in the                            sigmoid colon.                           - The examination was otherwise normal on direct                            and retroflexion views.                           - Personal history of colonic polyps. Multiple  left-sided polyps removed 2013 (Dr. Collene Mares)                           - Family hx CRCA father Recommendation:           - Patient has a contact number available for                             emergencies. The signs and symptoms of potential                            delayed complications were discussed with the                            patient. Return to normal activities tomorrow.                            Written discharge instructions were provided to the                            patient.                           - Resume previous diet.                           - Continue present medications.                           - No aspirin, ibuprofen, naproxen, or other                            non-steroidal anti-inflammatory drugs for 2 weeks                            after polyp removal.                           - Need ROI to get patology results from last                            colonoscopy by Dr. Collene Mares 2013                           ? if he has a hyperplastic polyposis syndrome                            though polyps may be too distal                           There were numerous tiny sigmoid polyps that I                            elected to leave                           May  need genetic testing as father had colon cancer                            thought < age 25 Grandfather and a brother died                            from pancreatic cancer Gatha Mayer, MD 09/21/2017 12:16:57 PM This report has been signed electronically.

## 2017-09-21 NOTE — Progress Notes (Signed)
Report to PACU, RN, vss, BBS= Clear.  

## 2017-09-21 NOTE — Patient Instructions (Addendum)
I found and removed several polyps today - one was large and though does not look like cancer - will need follow-up later this year to make sure it is all gone.  I marked the spot with a tattoo.  I want to see the pathology reports from last colonoscopy also - we will have you sign a release.  You may benefit from genetic testing, also  I will regroup with you when I have all the information.  I appreciate the opportunity to care for you.   Gatha Mayer, MD, FACGYOU HAD AN ENDOSCOPIC PROCEDURE TODAY AT [facility]:   Refer to the procedure report that was given to you for any specific questions about what was found during the examination.  If the procedure report does not answer your questions, please call your gastroenterologist to clarify.  If you requested that your care partner not be given the details of your procedure findings, then the procedure report has been included in a sealed envelope for you to review at your convenience later.  YOU SHOULD EXPECT: Some feelings of bloating in the abdomen. Passage of more gas than usual.  Walking can help get rid of the air that was put into your GI tract during the procedure and reduce the bloating. If you had a lower endoscopy (such as a colonoscopy or flexible sigmoidoscopy) you may notice spotting of blood in your stool or on the toilet paper. If you underwent a bowel prep for your procedure, you may not have a normal bowel movement for a few days.  Please Note:  You might notice some irritation and congestion in your nose or some drainage.  This is from the oxygen used during your procedure.  There is no need for concern and it should clear up in a day or so.  SYMPTOMS TO REPORT IMMEDIATELY:   Following lower endoscopy (colonoscopy or flexible sigmoidoscopy):  Excessive amounts of blood in the stool  Significant tenderness or worsening of abdominal pains  Swelling of the abdomen that is new, acute  Fever of  100F or higher  For urgent or emergent issues, a gastroenterologist can be reached at any hour by calling [phone].  No Aspirin, ibuprofen, naproxen and Nsaids for 2 weeks. Please see handouts given to you on polyps  DIET:  We do recommend a small meal at first, but then you may proceed to your regular diet.  Drink plenty of fluids but you should avoid alcoholic beverages for 24 hours.  ACTIVITY:  You should plan to take it easy for the rest of today and you should NOT DRIVE or use heavy machinery until tomorrow (because of the sedation medicines used during the test).    FOLLOW UP: Our staff will call the number listed on your records the next business day following your procedure to check on you and address any questions or concerns that you may have regarding the information given to you following your procedure. If we do not reach you, we will leave a message.  However, if you are feeling well and you are not experiencing any problems, there is no need to return our call.  We will assume that you have returned to your regular daily activities without incident.  If any biopsies were taken you will be contacted by phone or by letter within the next 1-3 weeks.  Please call us at [phone] if you have not heard about the biopsies in 3 weeks.    Thank  you for letting us take care of your healthcare needs today.  SIGNATURES/CONFIDENTIALITY: You and/or your care partner have signed paperwork which will be entered into your electronic medical record.  These signatures attest to the fact that that the information above on your After Visit Summary has been reviewed and is understood.  Full responsibility of the confidentiality of this discharge information lies with you and/or your care-partner.

## 2017-09-21 NOTE — Progress Notes (Signed)
Called to room to assist during endoscopic procedure.  Patient ID and intended procedure confirmed with present staff. Received instructions for my participation in the procedure from the performing physician.  

## 2017-09-24 ENCOUNTER — Telehealth: Payer: Self-pay | Admitting: *Deleted

## 2017-09-24 ENCOUNTER — Telehealth: Payer: Self-pay

## 2017-09-24 DIAGNOSIS — R109 Unspecified abdominal pain: Secondary | ICD-10-CM

## 2017-09-24 NOTE — Telephone Encounter (Signed)
Returned pts call.  He states that he has been having pain around his naval since procedure.  He says that in the past he was told that he had an umbilical hernia and knew it was there but it has never "stuck out" or "been sore" like it is now. He has not had a bowel movement since procedure and states that he has been passing "some" air.  He states that it mainly hurts when he coughs or takes a deep breath.  He currently rates the pain 4-5 out of 10 but says that over the weekend it was much worse.  He does not complain of fever.  Please advise.

## 2017-09-24 NOTE — Telephone Encounter (Signed)
Left message on answering machine. 

## 2017-09-24 NOTE — Telephone Encounter (Signed)
Patient states he has not had a bowel movement since his procedure on Friday 6.14.19 and he had abd pain around the naval Friday and Saturday. Pt wanting advice.

## 2017-09-24 NOTE — Telephone Encounter (Signed)
  Follow up Call-  Call back number 09/21/2017  Post procedure Call Back phone  # [phone]  Permission to leave phone message Yes  Some recent data might be hidden     Patient questions:  Do you have a fever, pain , or abdominal swelling? No. Patient stated that he hasn't had a solid bm since the procedure. States area about his knee that's sore. Pain Score  0 *  Have you tolerated food without any problems? Yes.    Have you been able to return to your normal activities? No.  Do you have any questions about your discharge instructions: Diet   No. Medications  No. Follow up visit  Yes.    Do you have questions or concerns about your Care? Yes.    Actions: * If pain score is 4 or above: No action needed, pain <4. Alphonsa Gin, RN has already sent a message to Dr. Lyndel Safe.  She will follow-up

## 2017-09-24 NOTE — Addendum Note (Signed)
Addended by: Larina Bras on: 09/24/2017 02:00 PM   Modules accepted: Orders
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
## 2017-09-24 NOTE — Telephone Encounter (Signed)
I have spoken to patient to advise that since he is having abdominal pain, our recommendation is that he have an acute abdominal series completed today to insure that there is no perforation or abnormality seen of the colon post colonoscopy. Patient indicates that he cannot come today because "its just not a good day for me. Im having some pain in my legs and other places too, not just my stomach..." I again advised that our recommendation would definitely be for him to have the xrays today as if there are any problems, they need to be identified and taken care of ASAP, but that either way, he needs to come as soon as he can. I have also advised that he some bowel changes are typical soon after colonoscopy and it may take a few days to normalize. Advised that he may start Miralax 1 capful (17 grams) daily if needed as long as his xrays come back negative. In addition, I have explained that should periumbilical abdominal pain persist, over the next 1-2 days, he should contact us again. Patient verbalizes understanding of all information.

## 2017-09-24 NOTE — Telephone Encounter (Signed)
I reviewed the colonoscopy report -multiple colonic polyps, largest one was 20 mm in the transverse colon status post piecemeal polypectomy.  The colonoscopy went very well. Sill, let us get acute abdominal series (AAS) just to be on the safer side. If negative for any perforation, start MiraLAX 17g po qd if he desires.  Please tell him constipation is expected after colonoscopy. If he still has umbilical tendernes in the next 1-2 days, please let me know

## 2017-10-02 ENCOUNTER — Telehealth: Payer: Self-pay | Admitting: Internal Medicine

## 2017-10-02 NOTE — Telephone Encounter (Signed)
ROI faxed to Dr.Mann for records.

## 2017-10-05 ENCOUNTER — Encounter: Payer: PPO | Attending: Physical Medicine & Rehabilitation | Admitting: Physical Medicine & Rehabilitation

## 2017-10-05 DIAGNOSIS — G479 Sleep disorder, unspecified: Secondary | ICD-10-CM | POA: Insufficient documentation

## 2017-10-05 DIAGNOSIS — G8929 Other chronic pain: Secondary | ICD-10-CM | POA: Insufficient documentation

## 2017-10-05 DIAGNOSIS — Z87891 Personal history of nicotine dependence: Secondary | ICD-10-CM | POA: Insufficient documentation

## 2017-10-05 DIAGNOSIS — I10 Essential (primary) hypertension: Secondary | ICD-10-CM | POA: Insufficient documentation

## 2017-10-05 DIAGNOSIS — G40909 Epilepsy, unspecified, not intractable, without status epilepticus: Secondary | ICD-10-CM | POA: Insufficient documentation

## 2017-10-05 DIAGNOSIS — M791 Myalgia, unspecified site: Secondary | ICD-10-CM | POA: Insufficient documentation

## 2017-10-05 DIAGNOSIS — R269 Unspecified abnormalities of gait and mobility: Secondary | ICD-10-CM | POA: Insufficient documentation

## 2017-10-05 DIAGNOSIS — E119 Type 2 diabetes mellitus without complications: Secondary | ICD-10-CM | POA: Insufficient documentation

## 2017-10-08 ENCOUNTER — Encounter: Payer: Self-pay | Admitting: Internal Medicine

## 2017-10-08 NOTE — Progress Notes (Signed)
[place] 1 year recall  Office  1) Let him know polyps all benign 2) Tell him I think he should see genetics counselor due to FHx colon and pancreatic cancer - and these polyps 3) Explain plan recheck 1 year 4) Sorry he had some pain after colon - hope all ok now

## 2017-10-09 ENCOUNTER — Ambulatory Visit (INDEPENDENT_AMBULATORY_CARE_PROVIDER_SITE_OTHER)
Admission: RE | Admit: 2017-10-09 | Discharge: 2017-10-09 | Disposition: A | Discharge status: Home / Self Care | Payer: PPO | Source: Ambulatory Visit | Attending: Internal Medicine | Admitting: Internal Medicine

## 2017-10-09 ENCOUNTER — Other Ambulatory Visit: Payer: Self-pay

## 2017-10-09 ENCOUNTER — Other Ambulatory Visit (INDEPENDENT_AMBULATORY_CARE_PROVIDER_SITE_OTHER): Payer: PPO

## 2017-10-09 ENCOUNTER — Telehealth: Payer: Self-pay | Admitting: Internal Medicine

## 2017-10-09 DIAGNOSIS — R109 Unspecified abdominal pain: Secondary | ICD-10-CM

## 2017-10-09 DIAGNOSIS — Z800 Family history of malignant neoplasm of digestive organs: Secondary | ICD-10-CM

## 2017-10-09 DIAGNOSIS — Z86010 Personal history of colonic polyps: Secondary | ICD-10-CM

## 2017-10-09 LAB — CBC WITH DIFFERENTIAL/PLATELET
BASOS PCT: 1.2 % (ref 0.0–3.0)
Basophils Absolute: 0.1 10*3/uL (ref 0.0–0.1)
Eosinophils Absolute: 0.2 10*3/uL (ref 0.0–0.7)
Eosinophils Relative: 3.7 % (ref 0.0–5.0)
HEMATOCRIT: 44 % (ref 39.0–52.0)
HEMOGLOBIN: 15.4 g/dL (ref 13.0–17.0)
Lymphocytes Relative: 42.2 % (ref 12.0–46.0)
Lymphs Abs: 2.7 10*3/uL (ref 0.7–4.0)
MCHC: 35 g/dL (ref 30.0–36.0)
MCV: 86.3 fl (ref 78.0–100.0)
Monocytes Absolute: 0.5 10*3/uL (ref 0.1–1.0)
Monocytes Relative: 7.2 % (ref 3.0–12.0)
Neutro Abs: 2.9 10*3/uL (ref 1.4–7.7)
Neutrophils Relative %: 45.7 % (ref 43.0–77.0)
Platelets: 219 10*3/uL (ref 150.0–400.0)
RBC: 5.1 Mil/uL (ref 4.22–5.81)
RDW: 13.6 % (ref 11.5–15.5)
WBC: 6.3 10*3/uL (ref 4.0–10.5)

## 2017-10-09 NOTE — Telephone Encounter (Signed)
Patient notified He will come in this afternoon for the labs and x-ray.  He understands to keep the appt for tomorrow.

## 2017-10-09 NOTE — Telephone Encounter (Signed)
Left message for patient to call back  

## 2017-10-09 NOTE — Telephone Encounter (Signed)
Patient reports abdominal pain just above the navel. "soreness".   Started immediately after the colonoscopy.  Slight improvement since procedure.  He reports that he also has groin pain.  Mostly on the right. He reports that he has returned to his normal bowel habits.  Pain is constant.  I did put him on for OV tomorrow at 3:15.  He goes on to report that there is a slight bulge to the area above his navel and the pain goes straight into his back. He considered ED visit for the pain yesterday.    Please advise is needs any orders prior to OV tomorrow.

## 2017-10-09 NOTE — Telephone Encounter (Signed)
CBC, CMET, amylase and lipase and 2 view abdomen

## 2017-10-10 ENCOUNTER — Ambulatory Visit (INDEPENDENT_AMBULATORY_CARE_PROVIDER_SITE_OTHER): Payer: PPO | Admitting: Internal Medicine

## 2017-10-10 ENCOUNTER — Encounter: Payer: Self-pay | Admitting: Internal Medicine

## 2017-10-10 VITALS — BP 128/80 | HR 76 | Ht 69.0 in | Wt 247.1 lb

## 2017-10-10 DIAGNOSIS — K429 Umbilical hernia without obstruction or gangrene: Secondary | ICD-10-CM | POA: Diagnosis not present

## 2017-10-10 DIAGNOSIS — R10815 Periumbilic abdominal tenderness: Secondary | ICD-10-CM

## 2017-10-10 LAB — COMPREHENSIVE METABOLIC PANEL
ALK PHOS: 82 U/L (ref 39–117)
ALT: 41 U/L (ref 0–53)
AST: 33 U/L (ref 0–37)
Albumin: 4.1 g/dL (ref 3.5–5.2)
BILIRUBIN TOTAL: 0.6 mg/dL (ref 0.2–1.2)
BUN: 13 mg/dL (ref 6–23)
CALCIUM: 9.3 mg/dL (ref 8.4–10.5)
CO2: 27 mEq/L (ref 19–32)
CREATININE: 0.92 mg/dL (ref 0.40–1.50)
Chloride: 103 mEq/L (ref 96–112)
GFR: 109.08 mL/min (ref >60.00)
Glucose, Bld: 184 mg/dL — ABNORMAL HIGH (ref 70–99)
Potassium: 3.6 mEq/L (ref 3.5–5.1)
Sodium: 137 mEq/L (ref 135–145)
TOTAL PROTEIN: 7.5 g/dL (ref 6.0–8.3)

## 2017-10-10 LAB — LIPASE: Lipase: 7 U/L — ABNORMAL LOW (ref 11.0–59.0)

## 2017-10-10 LAB — AMYLASE: Amylase: 49 U/L (ref 27–131)

## 2017-10-10 MED ORDER — OXYCODONE-ACETAMINOPHEN 5-325 MG PO TABS: 1.0000 | ORAL_TABLET | Freq: Four times a day (QID) | ORAL | 0 refills | Status: AC | PRN

## 2017-10-10 NOTE — Patient Instructions (Signed)
  You have been scheduled for a CT scan of the abdomen and pelvis at [facility] ([address]---this is in the same building as Press [facility]).   You are scheduled on 10/12/17 at 2:00pm. You should arrive 15 minutes prior to your appointment time for registration. Please follow the written instructions below on the day of your exam:  WARNING: IF YOU ARE ALLERGIC TO IODINE/X-RAY DYE, PLEASE NOTIFY RADIOLOGY IMMEDIATELY AT [phone]! YOU WILL BE GIVEN A 13 HOUR PREMEDICATION PREP.  1) Do not eat  anything after 10:00AM (4 hours prior to your test) 2) You have been given 2 bottles of oral contrast to drink. The solution may taste better if refrigerated, but do NOT add ice or any other liquid to this solution. Shake well before drinking.    Drink 1 bottle of contrast @ 12:00PM (2 hours prior to your exam)  Drink 1 bottle of contrast @ 1:00PM (1 hour prior to your exam)  You may take any medications as prescribed with a small amount of water except for the following: Metformin, Glucophage, Glucovance, Avandamet, Riomet, Fortamet, Actoplus Met, Janumet, Glumetza or Metaglip. The above medications must be held the day of the exam AND 48 hours after the exam.  The purpose of you drinking the oral contrast is to aid in the visualization of your intestinal tract. The contrast solution may cause some diarrhea. Before your exam is started, you will be given a small amount of fluid to drink. Depending on your individual set of symptoms, you may also receive an intravenous injection of x-ray contrast/dye. Plan on being at [facility] for 30 minutes or longer, depending on the type of exam you are having performed.  This test typically takes 30-45 minutes to complete.  If you have any questions regarding your exam or if you need to reschedule, you may call the CT department at [phone] between the hours of 8:00 am and 5:00 pm,  Monday-Friday.  ________________________________________________________________________   We have sent the following medications to your pharmacy for you to pick up at your convenience: Percocet    I appreciate the opportunity to care for you. Silvano Rusk, MD, [facility]
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
## 2017-10-11 ENCOUNTER — Encounter: Payer: Self-pay | Admitting: Internal Medicine

## 2017-10-12 ENCOUNTER — Inpatient Hospital Stay: Admission: RE | Admit: 2017-10-12 | Payer: PPO | Source: Ambulatory Visit

## 2017-10-14 ENCOUNTER — Other Ambulatory Visit: Payer: Self-pay

## 2017-10-14 ENCOUNTER — Encounter (HOSPITAL_COMMUNITY): Payer: Self-pay

## 2017-10-14 ENCOUNTER — Emergency Department (HOSPITAL_COMMUNITY)
Admission: EM | Admit: 2017-10-14 | Discharge: 2017-10-14 | Disposition: A | Discharge status: Home / Self Care | Payer: PPO | Attending: Emergency Medicine | Admitting: Emergency Medicine

## 2017-10-14 DIAGNOSIS — R531 Weakness: Secondary | ICD-10-CM | POA: Diagnosis not present

## 2017-10-14 DIAGNOSIS — Z79899 Other long term (current) drug therapy: Secondary | ICD-10-CM | POA: Insufficient documentation

## 2017-10-14 DIAGNOSIS — R55 Syncope and collapse: Secondary | ICD-10-CM | POA: Diagnosis not present

## 2017-10-14 DIAGNOSIS — E119 Type 2 diabetes mellitus without complications: Secondary | ICD-10-CM | POA: Insufficient documentation

## 2017-10-14 DIAGNOSIS — R42 Dizziness and giddiness: Secondary | ICD-10-CM | POA: Diagnosis not present

## 2017-10-14 DIAGNOSIS — I10 Essential (primary) hypertension: Secondary | ICD-10-CM | POA: Insufficient documentation

## 2017-10-14 DIAGNOSIS — R000 Tachycardia, unspecified: Secondary | ICD-10-CM | POA: Diagnosis not present

## 2017-10-14 DIAGNOSIS — Z87891 Personal history of nicotine dependence: Secondary | ICD-10-CM | POA: Insufficient documentation

## 2017-10-14 DIAGNOSIS — Z7984 Long term (current) use of oral hypoglycemic drugs: Secondary | ICD-10-CM | POA: Insufficient documentation

## 2017-10-14 DIAGNOSIS — I213 ST elevation (STEMI) myocardial infarction of unspecified site: Secondary | ICD-10-CM | POA: Diagnosis not present

## 2017-10-14 DIAGNOSIS — R110 Nausea: Secondary | ICD-10-CM | POA: Diagnosis not present

## 2017-10-14 LAB — URINALYSIS, ROUTINE W REFLEX MICROSCOPIC
Bilirubin Urine: NEGATIVE
Glucose, UA: NEGATIVE mg/dL
Hgb urine dipstick: NEGATIVE
Ketones, ur: NEGATIVE mg/dL
Leukocytes, UA: NEGATIVE
Nitrite: NEGATIVE
Protein, ur: NEGATIVE mg/dL
Specific Gravity, Urine: 1.011 (ref 1.005–1.030)
pH: 7 (ref 5.0–8.0)

## 2017-10-14 LAB — TROPONIN I: Troponin I: <0.03 ng/mL

## 2017-10-14 LAB — CBC
HCT: 45.5 % (ref 39.0–52.0)
Hemoglobin: 15.7 g/dL (ref 13.0–17.0)
MCH: 29 pg (ref 26.0–34.0)
MCHC: 34.5 g/dL (ref 30.0–36.0)
MCV: 84.1 fL (ref 78.0–100.0)
Platelets: 245 K/uL (ref 150–400)
RBC: 5.41 MIL/uL (ref 4.22–5.81)
RDW: 12.8 % (ref 11.5–15.5)
WBC: 6.7 K/uL (ref 4.0–10.5)

## 2017-10-14 LAB — BASIC METABOLIC PANEL WITH GFR
Anion gap: 8 (ref 5–15)
BUN: 11 mg/dL (ref 6–20)
CO2: 26 mmol/L (ref 22–32)
Calcium: 9.9 mg/dL (ref 8.9–10.3)
Chloride: 103 mmol/L (ref 98–111)
Creatinine, Ser: 1.16 mg/dL (ref 0.61–1.24)
GFR calc Af Amer: >60 mL/min
GFR calc non Af Amer: >60 mL/min
Glucose, Bld: 171 mg/dL — ABNORMAL HIGH (ref 70–99)
Potassium: 4.4 mmol/L (ref 3.5–5.1)
Sodium: 137 mmol/L (ref 135–145)

## 2017-10-14 LAB — CBG MONITORING, ED: Glucose-Capillary: 164 mg/dL — ABNORMAL HIGH (ref 70–99)

## 2017-10-14 MED ORDER — SODIUM CHLORIDE 0.9 % IV BOLUS
1000.0000 mL | Freq: Once | INTRAVENOUS | Status: AC
  Administered 2017-10-14: 1000 mL via INTRAVENOUS

## 2017-10-14 NOTE — ED Triage Notes (Signed)
Pt [place] ED from church with complaints of near syncopal episode since approx 1130 this morning pt was diaphoretic and dizzy while sitting in church. EMS reports pt is diabetic, CBG 140, some EKG changes per EMS, pt reports having a recent cath, no blockages found. Pt alert and oriented. Pt placed in position of comfort with bed locked and lowered, call bell in reach.

## 2017-10-14 NOTE — ED Notes (Signed)
Pt aware urine sample is still needed.

## 2017-10-14 NOTE — ED Notes (Signed)
Patient verbalizes understanding of discharge instructions. Opportunity for questioning and answers were provided. Armband removed by staff, pt discharged from ED ambulatory.   

## 2017-10-14 NOTE — Discharge Instructions (Addendum)
Continue to take your medications as prescribed by your PCP. Follow-up with them if you continue to have episodes similar to what happened today.  Stay hydrated while at work and take breaks or sit down if you begin feeling lightheaded or dizzy.
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
## 2017-10-17 ENCOUNTER — Encounter: Payer: Self-pay | Admitting: Genetic Counselor

## 2017-10-17 ENCOUNTER — Telehealth: Payer: Self-pay | Admitting: Genetic Counselor

## 2017-10-17 ENCOUNTER — Ambulatory Visit (INDEPENDENT_AMBULATORY_CARE_PROVIDER_SITE_OTHER)
Admission: RE | Admit: 2017-10-17 | Discharge: 2017-10-17 | Disposition: A | Discharge status: Home / Self Care | Payer: PPO | Source: Ambulatory Visit | Attending: Internal Medicine | Admitting: Internal Medicine

## 2017-10-17 DIAGNOSIS — K5730 Diverticulosis of large intestine without perforation or abscess without bleeding: Secondary | ICD-10-CM | POA: Diagnosis not present

## 2017-10-17 DIAGNOSIS — R10815 Periumbilic abdominal tenderness: Secondary | ICD-10-CM

## 2017-10-17 MED ORDER — IOPAMIDOL (ISOVUE-300) INJECTION 61%
100.0000 mL | Freq: Once | INTRAVENOUS | Status: AC | PRN
  Administered 2017-10-17: 100 mL via INTRAVENOUS

## 2017-10-17 NOTE — Telephone Encounter (Signed)
New genetic counseling referral received from Dr. Carlean Purl for fhx of colon and pancreatic cancer and a hx of colon polyps. Pt has been scheduled to see Roma Kayser on 8/19 at 2pm. Pt aware to arrive 30 minutes early. Letter mailed.

## 2017-10-18 NOTE — Progress Notes (Signed)
No problem from colonoscopy  I recommend he see a surgeon re: umbilical hernia w/ sxs  [facility] either here or [place] office if he wants

## 2017-10-24 ENCOUNTER — Ambulatory Visit (INDEPENDENT_AMBULATORY_CARE_PROVIDER_SITE_OTHER): Payer: PPO

## 2017-10-24 ENCOUNTER — Encounter (HOSPITAL_COMMUNITY): Payer: Self-pay | Admitting: Emergency Medicine

## 2017-10-24 ENCOUNTER — Ambulatory Visit (HOSPITAL_COMMUNITY)
Admission: EM | Admit: 2017-10-24 | Discharge: 2017-10-24 | Disposition: A | Discharge status: Home / Self Care | Payer: PPO | Attending: Internal Medicine | Admitting: Internal Medicine

## 2017-10-24 DIAGNOSIS — M25551 Pain in right hip: Secondary | ICD-10-CM

## 2017-10-24 DIAGNOSIS — M25561 Pain in right knee: Secondary | ICD-10-CM

## 2017-10-24 DIAGNOSIS — W19XXXA Unspecified fall, initial encounter: Secondary | ICD-10-CM | POA: Diagnosis not present

## 2017-10-24 DIAGNOSIS — M1611 Unilateral primary osteoarthritis, right hip: Secondary | ICD-10-CM | POA: Diagnosis not present

## 2017-10-24 DIAGNOSIS — S79911A Unspecified injury of right hip, initial encounter: Secondary | ICD-10-CM | POA: Diagnosis not present

## 2017-10-24 MED ORDER — NAPROXEN 375 MG PO TABS: 375.0000 mg | ORAL_TABLET | Freq: Two times a day (BID) | ORAL | 0 refills | Status: DC

## 2017-10-24 NOTE — Discharge Instructions (Addendum)
X-rays did not show signs of fracture or dislocation Declines brace at this time Continue conservative management of rest, ice, and elevate Take naproxen as needed for pain relief (may cause abdominal discomfort, ulcers, and GI bleeds avoid taking with other NSAIDs) Follow up with PCP if symptoms persist Return or go to the ER if you have any new or worsening symptoms (fever, chills, chest pain, abdominal pain, changes in bowel or bladder habits, pain radiating into lower legs, etc...)
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
## 2017-10-28 NOTE — Progress Notes (Signed)
Subjective:    Patient ID: Cameron Diaz, male    DOB: [birth year], 57 y.o.   MRN: [id number]   CC: hip, knee, chest pain   HPI: [name] is a 57 year old male that presents following an ER visit for a fall eliciting right hip and knee pain on 10/24/2017. X-rays of hip and knee at that time showing no acute bony abnormalities. He denies fever, chills, erythema or swelling of regions, numbness, weakness, saddle anesthesia, or bowel/bladder incontinence.    Hip pain: Currently 7/10.  Earlier today rated as a 10/10, however took some pain medication prior to arrival. He states it hurts more when he is sitting and standing for long periods. States he is tender over the lateral/posterior portion of his upper thigh, that feels like "burning, pins and needles". No radiation. He has been using leftover percocet (breaking into halves) from his GI doctor for the pain with temporary relief.  Knee pain: 7/10 currently. Similar pain, states his knee feels sore, with pins and needles going down to the anterior tibia to his dorsal foot. No specific motion makes the pain worse.    Chest pain: He also mentions a "warming, pins and needles" discomfort across his chest present since Jan 2019. Generally lasting intermittently for 24-48 hours at a time, with no noted association to exercise, time of day, eating, or anxiety. Occurring up to 2-3 times a week. While it is usually across his entire chest, sometimes it will only be on the right or left. Denies an elicting event or concurrent fever, GI discomfort, palpitations, SOB, radiation to jaw or arms, or diaphoresis. This chest pain has not increased in frequency or severity since onset.       Smoking status reviewed  Review of Systems Per HPI, also denies recent illness, headache, changes in vision, chest pain, shortness of breath, abdominal pain, N/V/D, weakness   Patient Active Problem List   Diagnosis Date Noted  . Atypical chest pain 10/30/2017  .  Trochanteric bursitis of right hip 10/30/2017  . Umbilical hernia without obstruction and without gangrene 10/30/2017  . Knee pain, right 10/30/2017  . Family history of colon cancer in father 09/21/2017  . Family history of pancreatic cancer - brother and grandfather 09/21/2017  . Bilateral leg edema 02/28/2017  . Abdominal pain 05/08/2016  . Erectile dysfunction 02/09/2016  . Neck pain 12/21/2015  . Depression 02/20/2015  . Foot callus 09/08/2014  . Hyperlipidemia 08/26/2014  . Essential hypertension 08/24/2014  . Type 2 diabetes mellitus ([place]) 08/24/2014  . Psoriasis 08/24/2014  . Constipation 08/24/2014  . Insomnia 08/24/2014  . Chronic pain 08/24/2014  . H/O alcohol abuse 08/24/2014  . H/O drug abuse 08/24/2014  . History of colonic polyps numerous and large hyperplastics 09/11/2011     Objective:  BP 134/68   Pulse (!) 105   Temp 98.1 F (36.7 C) (Oral)   Ht 5\' 9"  (1.753 m)   Wt 247 lb (112 kg)   SpO2 95%   BMI 36.48 kg/m  Vitals and nursing note reviewed  General: NAD, pleasant Cardiac: RRR, normal heart sounds, no murmurs. No tenderness of chest wall.  Respiratory: CTAB, normal effort Abdomen:Soft, ND. Normoactive BS. Tender over reducible umbilical hernia.   Extremities: no edema or cyanosis. Distal pulses intact and palpable.  MSK:   Right Hip: Tender with palpation over greater trochanter. Active ROM intact. 5/5 hip strength.     Right Knee: no skin changes, swelling, or deformities noted. Non-tender with palpation. Active  ROM intact. 5/5 strength at knee and ankle joint.                    Valgus/varus stressing testing negative. Lachmann's negative.  2/4 patellar reflexes Skin: warm and dry, no rashes noted Neuro: alert and oriented, no focal deficits Psych: normal affect  Assessment & Plan:    Chronic pain - Patient started seeing Dr. Posey Pronto for chronic pain management back in May 2019.  Was instructed to follow-up in 1 month, however was under the  impression that he would receive a call to establish that appointment, so this was not made.  - Encourage the patient to make an appointment with Dr. Posey Pronto for continued treatment  Atypical chest pain - Unclear etiology.  Seems non-cardiac in nature.  However, given the patient is in his 10s with a history of diabetes, hypertension, hyperlipidemia, and previous smoker, would like him to follow-up with cardiology. -Cardiology referral placed.  Trochanteric bursitis of right hip -Patient has point tenderness over his right greater trochanter, likely trochanteric bursitis.  X-ray at ED showing no acute fracture. - Offered to perform a bursa injection, however patient was scared and did not want to go through with that today.  Ensured him that he can always come back if he changes his mind or wait for follow-up with Dr. Posey Pronto and see if an injection is appropriate at that time.    Umbilical hernia without obstruction and without gangrene - Tenderness with palpation to his reducible umbilical hernia. - Recently saw his GI doc for this concern, who obtained a CT abdomen showing a stable umbilical hernia without incarceration or obstruction. - Surgical referral placed for symptomatic hernia  Knee pain, right - Sore knee pain starting after fall.  X-ray in the ED showing no acute fracture.  PE unremarkable. - Recommend ice/heat as needed.  Can use scheduled Tylenol and ibuprofen.  These modalities can also be applied to his hip.  PMP reviewed and appropriate.   F/u in 2 months, will also do diabetic f/u at that time.   Darrelyn Hillock, DO Family Medicine Resident PGY-1

## 2017-10-29 ENCOUNTER — Other Ambulatory Visit: Payer: Self-pay

## 2017-10-29 ENCOUNTER — Ambulatory Visit (INDEPENDENT_AMBULATORY_CARE_PROVIDER_SITE_OTHER): Payer: PPO | Admitting: Family Medicine

## 2017-10-29 VITALS — BP 134/68 | HR 105 | Temp 98.1°F | Ht 69.0 in | Wt 247.0 lb

## 2017-10-29 DIAGNOSIS — R0789 Other chest pain: Secondary | ICD-10-CM | POA: Diagnosis not present

## 2017-10-29 DIAGNOSIS — G894 Chronic pain syndrome: Secondary | ICD-10-CM

## 2017-10-29 DIAGNOSIS — M7061 Trochanteric bursitis, right hip: Secondary | ICD-10-CM

## 2017-10-29 DIAGNOSIS — K429 Umbilical hernia without obstruction or gangrene: Secondary | ICD-10-CM | POA: Diagnosis not present

## 2017-10-29 DIAGNOSIS — M25561 Pain in right knee: Secondary | ICD-10-CM

## 2017-10-29 NOTE — Patient Instructions (Signed)
It was so nice to meet you today Cameron Diaz!  We discussed your hip, knee, and chest pain that have been very frustrating for you.  We will be referring you to cardiology for further evaluation of your chest pain.  We also recommend you make your follow-up visit with Dr. Posey Pronto.  You may discuss a trochanteric bursa injection with Dr. Posey Pronto or return for this, if you wish.  Continue to try ice and heat on your hip and knee pain and elevate your legs.  You may also try scheduling Tylenol and ibuprofen as needed.  Lastly we will make sure that you have a surgical referral to look at your umbilical hernia.  I truly hope you start feeling better!

## 2017-10-30 DIAGNOSIS — M25561 Pain in right knee: Secondary | ICD-10-CM | POA: Insufficient documentation

## 2017-10-30 DIAGNOSIS — K429 Umbilical hernia without obstruction or gangrene: Secondary | ICD-10-CM | POA: Insufficient documentation

## 2017-10-30 DIAGNOSIS — R0789 Other chest pain: Secondary | ICD-10-CM | POA: Insufficient documentation

## 2017-10-30 DIAGNOSIS — M7061 Trochanteric bursitis, right hip: Secondary | ICD-10-CM

## 2017-10-30 HISTORY — DX: Trochanteric bursitis, right hip: M70.61

## 2017-10-30 HISTORY — DX: Umbilical hernia without obstruction or gangrene: K42.9

## 2017-10-30 HISTORY — DX: Other chest pain: R07.89

## 2017-10-30 HISTORY — DX: Pain in right knee: M25.561

## 2017-10-30 NOTE — Assessment & Plan Note (Signed)
-   Patient started seeing Dr. Posey Pronto for chronic pain management back in May 2019.  Was instructed to follow-up in 1 month, however was under the impression that he would receive a call to establish that appointment, so this was not made.  - Encourage the patient to make an appointment with Dr. Posey Pronto for continued treatment

## 2017-10-30 NOTE — Assessment & Plan Note (Signed)
-   Unclear etiology.  Seems non-cardiac in nature.  However, given the patient is in his 35s with a history of diabetes, hypertension, hyperlipidemia, and previous smoker, would like him to follow-up with cardiology. -Cardiology referral placed.

## 2017-10-30 NOTE — Assessment & Plan Note (Signed)
-   Tenderness with palpation to his reducible umbilical hernia. - Recently saw his GI doc for this concern, who obtained a CT abdomen showing a stable umbilical hernia without incarceration or obstruction. - Surgical referral placed for symptomatic hernia

## 2017-10-30 NOTE — Assessment & Plan Note (Addendum)
-  Patient has point tenderness over his right greater trochanter, likely trochanteric bursitis.  X-ray at ED showing no acute fracture. - Offered to perform a bursa injection, however patient was scared and did not want to go through with that today.  Ensured him that he can always come back if he changes his mind or wait for follow-up with Dr. Posey Pronto and see if an injection is appropriate at that time.

## 2017-10-30 NOTE — Assessment & Plan Note (Signed)
-   Sore knee pain starting after fall.  X-ray in the ED showing no acute fracture.  PE unremarkable. - Recommend ice/heat as needed.  Can use scheduled Tylenol and ibuprofen.  These modalities can also be applied to his hip.

## 2017-10-31 ENCOUNTER — Other Ambulatory Visit: Payer: Self-pay | Admitting: *Deleted

## 2017-10-31 NOTE — Patient Outreach (Addendum)
[facility]) Care Management  10/31/2017  TALLY MCKINNON 03-31-1961 [id number]  Referral via [facility] Utilization Management Department-Antonette Zenaida Niece; Reason: patient has financial concerns. Wants to know if there are agencies or pantries that could assist?  Telephone call #1 to patient.  Patient advised of reason for call & of [facility] care management services.  HIPPA verification received.    Patient states he is able to get medications. States he takes medications as prescribed by MD.  Voices that his HTN & DM2 are under control. (per chart recent A1c -6.2). Patient voices that he went to diabetic training & follows the suggested guidelines that he was taught. States he does have chronic low back & leg pain.  States he is having problems with finances running out before the end of month.  States he gets disability check monthly.  Voices that he has applied for Medicaid & food stamps but has been turned down.   Patient voices he would be interested in resources that could assist him at this time.  Patient consents to [facility] care management services & referral to Social Worker.  Plan: Referral to Education officer, museum for [employer]. Telephonic signing off.  Sherrin Daisy, RN BSN [place] Management Coordinator [facility] Care Management  [phone]

## 2017-11-02 ENCOUNTER — Other Ambulatory Visit: Payer: Self-pay | Admitting: *Deleted
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
## 2017-11-08 ENCOUNTER — Encounter: Payer: PPO | Attending: Physical Medicine & Rehabilitation | Admitting: Physical Medicine & Rehabilitation

## 2017-11-08 ENCOUNTER — Encounter: Payer: Self-pay | Admitting: Physical Medicine & Rehabilitation

## 2017-11-08 ENCOUNTER — Encounter: Payer: PPO | Admitting: Physical Medicine & Rehabilitation

## 2017-11-08 VITALS — BP 135/91 | HR 101 | Resp 14 | Ht 69.0 in | Wt 247.0 lb

## 2017-11-08 DIAGNOSIS — E119 Type 2 diabetes mellitus without complications: Secondary | ICD-10-CM | POA: Diagnosis not present

## 2017-11-08 DIAGNOSIS — Z87891 Personal history of nicotine dependence: Secondary | ICD-10-CM | POA: Diagnosis not present

## 2017-11-08 DIAGNOSIS — R269 Unspecified abnormalities of gait and mobility: Secondary | ICD-10-CM | POA: Diagnosis not present

## 2017-11-08 DIAGNOSIS — M791 Myalgia, unspecified site: Secondary | ICD-10-CM | POA: Diagnosis not present

## 2017-11-08 DIAGNOSIS — G40909 Epilepsy, unspecified, not intractable, without status epilepticus: Secondary | ICD-10-CM | POA: Diagnosis not present

## 2017-11-08 DIAGNOSIS — G479 Sleep disorder, unspecified: Secondary | ICD-10-CM | POA: Diagnosis not present

## 2017-11-08 DIAGNOSIS — M79631 Pain in right forearm: Secondary | ICD-10-CM | POA: Diagnosis not present

## 2017-11-08 DIAGNOSIS — G894 Chronic pain syndrome: Secondary | ICD-10-CM | POA: Diagnosis not present

## 2017-11-08 DIAGNOSIS — G8929 Other chronic pain: Secondary | ICD-10-CM | POA: Insufficient documentation

## 2017-11-08 DIAGNOSIS — I10 Essential (primary) hypertension: Secondary | ICD-10-CM | POA: Diagnosis not present

## 2017-11-08 MED ORDER — BACLOFEN 20 MG PO TABS
20.0000 mg | ORAL_TABLET | Freq: Three times a day (TID) | ORAL | 1 refills | Status: DC
Start: 2017-11-08 — End: 2018-06-18
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
## 2017-11-09 ENCOUNTER — Ambulatory Visit: Payer: Self-pay | Admitting: *Deleted

## 2017-11-09 ENCOUNTER — Encounter: Payer: Self-pay | Admitting: *Deleted

## 2017-11-16 ENCOUNTER — Other Ambulatory Visit: Payer: Self-pay | Admitting: *Deleted

## 2017-11-16 NOTE — Patient Outreach (Signed)
[facility]) Care Management  11/16/2017  Cameron Diaz Aug 22, 1960 [id number]   Patient referred to this social worker to provide community resources related to his finances. This Education officer, museum mailed patient community Futures trader related to budgeting classes, food pantries, housing and emergency assistance  Patient confirms today that the information has been received. Patient verbalized having no additional community resource needs at this time. This social worker's contact information provided if there are any community resource needs that arise in the future.   Sheralyn Boatman [facility] Care Management [phone]

## 2017-11-19 DIAGNOSIS — M542 Cervicalgia: Secondary | ICD-10-CM | POA: Diagnosis not present

## 2017-11-26 ENCOUNTER — Inpatient Hospital Stay: Payer: PPO

## 2017-11-26 ENCOUNTER — Inpatient Hospital Stay: Payer: PPO | Attending: Genetic Counselor | Admitting: Genetic Counselor

## 2017-11-28 DIAGNOSIS — K429 Umbilical hernia without obstruction or gangrene: Secondary | ICD-10-CM | POA: Diagnosis not present

## 2017-12-03 ENCOUNTER — Ambulatory Visit (INDEPENDENT_AMBULATORY_CARE_PROVIDER_SITE_OTHER): Payer: PPO | Admitting: Cardiology

## 2017-12-03 ENCOUNTER — Encounter: Payer: Self-pay | Admitting: Cardiology

## 2017-12-03 VITALS — BP 124/84 | HR 69 | Ht 69.0 in | Wt 246.0 lb

## 2017-12-03 DIAGNOSIS — R0789 Other chest pain: Secondary | ICD-10-CM | POA: Diagnosis not present

## 2017-12-03 DIAGNOSIS — I10 Essential (primary) hypertension: Secondary | ICD-10-CM | POA: Diagnosis not present

## 2017-12-03 DIAGNOSIS — K429 Umbilical hernia without obstruction or gangrene: Secondary | ICD-10-CM | POA: Diagnosis not present

## 2017-12-03 NOTE — Assessment & Plan Note (Signed)
06/11/17. He is not on a statin presumably secondary to chronic pain syndrome and cirrhosis

## 2017-12-03 NOTE — Assessment & Plan Note (Signed)
Type 2 NIDDM with neuropathy

## 2017-12-03 NOTE — Patient Instructions (Signed)
Medication Instructions:  Your physician recommends that you continue on your current medications as directed. Please refer to the Current Medication list given to you today.   Labwork: None ordered  Testing/Procedures: None ordered  Follow-Up: Your physician recommends that you schedule a follow-up appointment in: 2 months with Dr.Crenshaw   Any Other Special Instructions Will Be Listed Below (If Applicable).     If you need a refill on your cardiac medications before your next appointment, please call your pharmacy.

## 2017-12-03 NOTE — Assessment & Plan Note (Signed)
Controlled, normal echo Dec 2018
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
## 2017-12-03 NOTE — Assessment & Plan Note (Signed)
Atypical chest pain but positive cardiac risk factors including DM

## 2017-12-03 NOTE — Assessment & Plan Note (Signed)
Scheduled for surgery Sept 17. From a cardiac standpoint he is not cleared for surgery unless he has [employer] which he declined today in the office.

## 2017-12-04 ENCOUNTER — Telehealth: Payer: Self-pay

## 2017-12-04 DIAGNOSIS — Z01810 Encounter for preprocedural cardiovascular examination: Secondary | ICD-10-CM

## 2017-12-04 DIAGNOSIS — R0789 Other chest pain: Secondary | ICD-10-CM

## 2017-12-04 NOTE — Telephone Encounter (Signed)
Pt requesting  Call back from Dr. Higinio Plan to discuss his appointment with cardio yesterday Wallace Cullens, RN

## 2017-12-05 NOTE — Telephone Encounter (Signed)
Called patient this am, no response. Will try again later tonight. If patient does call the clinic today, I agree he should go forward with the lexiscan myoview stress test.

## 2017-12-05 NOTE — Telephone Encounter (Signed)
Patient left message he is returning call to PCP. Called patient back x 2 but "call cannot be completed at this time".  Left call back number of [phone]  Danley Danker, RN [facility] RN)

## 2017-12-07 ENCOUNTER — Encounter: Payer: Self-pay | Admitting: Genetic Counselor

## 2017-12-11 NOTE — Telephone Encounter (Signed)
Follow up:   Patient returning call concerning clearance.

## 2017-12-11 NOTE — Telephone Encounter (Signed)
Message routed to [facility] PA who evaluated patient on 8/26 and ordered test and new primary cardiologist Dr. Stanford Breed to review and advise. See notes below

## 2017-12-11 NOTE — Telephone Encounter (Signed)
Spoke with the patient over the phone this afternoon about his recent cardiology appointment. Discussed the lexiscan myoview test with him and why cardiology is recommending this. States he does want "all of these chemicals" injected into his body and is not interested in having this done after our conversation because he states his atypical chest pain "is not a big deal and I can walk miles on the treadmill without a problem". Recommended he undergo the myoview testing and since his refusal, also recommended he discuss with his cardiologist if there is any alternative modalities they can perform without chemicals to further evaluate his chest pain. They will not clear him for his hernia surgery at this time.

## 2017-12-12 NOTE — Telephone Encounter (Signed)
Patient returned call. He is agreeable to exercise myoview. He understands that he will received an imaging agent for the imaging portion of the test but will need to walk on the treadmill. Test ordered. Staff message sent to scheduling pool to arrange.

## 2017-12-12 NOTE — Addendum Note (Signed)
Addended by: Fidel Levy on: 12/12/2017 03:52 PM   Modules accepted: Orders

## 2017-12-12 NOTE — Telephone Encounter (Signed)
LMTCB to discuss pre-op testing. Per last PA note "We have recommended a Lexiscan Myoview and have explained the procedure to the patient but at this time he declines, knowing that this decision may delay his umbilical surgery."  See Luke's note below.

## 2017-12-12 NOTE — Telephone Encounter (Signed)
See if he will agree to a GXT Myoview- not sure if he will be able to exercise with his hernia.  Kerin Ransom PA-C 12/12/2017 9:55 AM

## 2017-12-13 ENCOUNTER — Telehealth: Payer: Self-pay | Admitting: Cardiology

## 2017-12-13 NOTE — Telephone Encounter (Signed)
Called patient and LVM to call back and schedule exercise myoview before 12-25-17.

## 2017-12-18 ENCOUNTER — Telehealth (HOSPITAL_COMMUNITY): Payer: Self-pay

## 2017-12-18 NOTE — Telephone Encounter (Signed)
Encounter complete. 

## 2017-12-20 ENCOUNTER — Inpatient Hospital Stay (HOSPITAL_COMMUNITY): Admission: RE | Admit: 2017-12-20 | Payer: PPO | Source: Ambulatory Visit

## 2017-12-20 ENCOUNTER — Encounter: Payer: PPO | Attending: Physical Medicine & Rehabilitation | Admitting: Physical Medicine & Rehabilitation

## 2017-12-20 DIAGNOSIS — R269 Unspecified abnormalities of gait and mobility: Secondary | ICD-10-CM | POA: Insufficient documentation

## 2017-12-20 DIAGNOSIS — G8929 Other chronic pain: Secondary | ICD-10-CM | POA: Insufficient documentation

## 2017-12-20 DIAGNOSIS — Z87891 Personal history of nicotine dependence: Secondary | ICD-10-CM | POA: Insufficient documentation

## 2017-12-20 DIAGNOSIS — M542 Cervicalgia: Secondary | ICD-10-CM | POA: Diagnosis not present

## 2017-12-20 DIAGNOSIS — G479 Sleep disorder, unspecified: Secondary | ICD-10-CM | POA: Insufficient documentation

## 2017-12-20 DIAGNOSIS — M791 Myalgia, unspecified site: Secondary | ICD-10-CM | POA: Insufficient documentation

## 2017-12-20 DIAGNOSIS — E119 Type 2 diabetes mellitus without complications: Secondary | ICD-10-CM | POA: Insufficient documentation

## 2017-12-20 DIAGNOSIS — I10 Essential (primary) hypertension: Secondary | ICD-10-CM | POA: Insufficient documentation

## 2017-12-20 DIAGNOSIS — G40909 Epilepsy, unspecified, not intractable, without status epilepticus: Secondary | ICD-10-CM | POA: Insufficient documentation

## 2017-12-25 ENCOUNTER — Encounter (HOSPITAL_COMMUNITY): Payer: PPO

## 2018-01-02 ENCOUNTER — Other Ambulatory Visit: Payer: Self-pay

## 2018-01-02 ENCOUNTER — Encounter: Payer: Self-pay | Admitting: Family Medicine

## 2018-01-02 ENCOUNTER — Ambulatory Visit (INDEPENDENT_AMBULATORY_CARE_PROVIDER_SITE_OTHER): Payer: PPO | Admitting: Family Medicine

## 2018-01-02 VITALS — BP 122/70 | HR 100 | Temp 97.8°F | Ht 71.0 in | Wt 248.4 lb

## 2018-01-02 DIAGNOSIS — E1149 Type 2 diabetes mellitus with other diabetic neurological complication: Secondary | ICD-10-CM

## 2018-01-02 DIAGNOSIS — Z23 Encounter for immunization: Secondary | ICD-10-CM

## 2018-01-02 DIAGNOSIS — I872 Venous insufficiency (chronic) (peripheral): Secondary | ICD-10-CM | POA: Diagnosis not present

## 2018-01-02 DIAGNOSIS — G894 Chronic pain syndrome: Secondary | ICD-10-CM

## 2018-01-02 DIAGNOSIS — IMO0001 Reserved for inherently not codable concepts without codable children: Secondary | ICD-10-CM

## 2018-01-02 LAB — POCT GLYCOSYLATED HEMOGLOBIN (HGB A1C): HbA1c, POC (controlled diabetic range): 6.1 % (ref 0.0–7.0)

## 2018-01-02 NOTE — Patient Instructions (Signed)
For your pain, please start taking the Mobic 15mg  daily with food prescribed by Dr. Posey Pronto, and continuing following up with him. Also continue to do activities that make you happy, its wonderful for your mood as you've told me and it helps your pain!   For your diabetes, it is wonderfully controlled. You can stop checking your sugar 2-3 times a week.   For your itching right leg, start using Vaseline 2 times daily and wear compression stockings. You can order these at www.[email or web address].com or go to your local pharmacy/walmart for support hose. Elevate your legs as you can.   So glad to see you today! Please f/u in 1-2 months.

## 2018-01-02 NOTE — Progress Notes (Signed)
Subjective:    Patient ID: Cameron Diaz, male    DOB: Nov 02, 1960, 57 y.o.   MRN: [id number]   HPI: Cameron Diaz presents for a follow up of his diabetes and chronic pain. He would also like to discuss "itchiness" of his right lower leg.   Chronic Pain: Follows with and recently saw Dr. Posey Pronto, PM&R, in 11/2017 for his chronic pain. Recommended restarting Cymbalta, scheduled daily Mobic, increasing baclofen to 20mg  TID, and trial of the TENS unit. However patient reports he is not taking baclofen or Cymbalta (or Pamelor instead as previously taking), because he feels this was too many medications and alters his normal bowel regimen (endorses constipation when taking too many medications).  He believes he has tried the Mobic once or twice. He was using his TENs unit with some relief, however his dog recently ate a few pieces of the attachments. Today his "all over," varying quality "some of everything-burning, aching, throbbing" pain is very well. Pain 5/10. He feels great today because he took a hot bath and then relaxed outside with his dogs, elevating his mood.   R Lower Leg Itchiness: Around right ankle, mid-shin for the past couple of months, has not increased in severity. Intermittent itchiness, unable to identify any exacerbating factors. No specific time pattern. Puts rubbing alcohol on the area after scratching with some minor relief. Has not tried any creams/lotions consistently or any medications for it. Does have a history of psoriasis, but he hasn't noticed any rash or bug bites in the region, but notes both of his BLE appear to be slightly red. Denies trauma to the area. He has swelling in his bilateral legs everyday, some improvement in swelling after putting his legs up. Denies any recent illness, fever, lesions, or drainage in the area, SOB, or orthopnea.   Diabetes: A1c 6.1 today  Taking medications: Metformin XR 500mg  daily  Glucose average if monitoring: 2-3/ week, 140 on average, 98  lowest seen  Hypoglycemic symptoms? Has had a happened a few times, reports sweating, lightheadedness/dizziness- has not checked glucose during this  Exercise: [employer] stationary bike, hasn't been in a few weeks but was going 3 x/week and felt better during that time but is a long drive for him  Diet: Balanced, however lots of sweets  ASCVD 10 year risk: 19.1%  Last eye exam: Needs to follow up  Last foot exam: 2018, takes care of feet well  Pneumococcal vaccine? Will recommend  ROS: denies dizziness, diaphoresis, LOC, polyuria, polydipsia   Of note, pt did not show for his cardiac myoview scheduled on 9/12 due to not wanting "any additional chemicals/contrast" into his body. He was also offered a treadmill test, but was told this would be ?$350 upfront outpocket cost and can not afford that. Cardiology has not felt comfortable for clearing him for his umbilical hernia surgery until they can further evaluate his intermittent atypical chest pain that he continues to have.    Smoking status reviewed  Review of Systems Per HPI, also denies recent illness, fever, headache, changes in vision, shortness of breath, abdominal pain, N/V/D, weakness   Patient Active Problem List   Diagnosis Date Noted  . 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 Myalgia 11/08/2017  . Atypical chest pain 10/30/2017  . Trochanteric bursitis of right hip 10/30/2017  . Umbilical hernia without obstruction and without gangrene 10/30/2017  . Knee pain, right 10/30/2017  . Family history of colon cancer in father 09/21/2017  . Family history of  pancreatic cancer - brother and grandfather 09/21/2017  . Bilateral leg edema 02/28/2017  . Abdominal pain 05/08/2016  . Erectile dysfunction 02/09/2016  . Neck pain 12/21/2015  . Depression 02/20/2015  . Foot callus 09/08/2014  . Hyperlipidemia 08/26/2014  . Essential hypertension 08/24/2014  . 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 Psoriasis 08/24/2014  . Constipation 08/24/2014  . Insomnia 08/24/2014  . Chronic pain 08/24/2014  . H/O alcohol abuse 08/24/2014  . H/O drug abuse 08/24/2014  . History of colonic polyps numerous and large hyperplastics 09/11/2011     Objective:  BP 122/70 (BP Location: Right Arm, Patient Position: Sitting, Cuff Size: Large)   Pulse 100   Temp 97.8 F (36.6 C) (Oral)   Ht 5\' 11"  (1.803 m)   Wt 248 lb 6.4 oz (112.7 kg)   SpO2 97%   BMI 34.64 kg/m  Vitals and nursing note reviewed  General: NAD, pleasant Cardiac: RRR, normal heart sounds, no murmurs Respiratory: CTAB, normal effort Abdomen: soft, nontender, nondistended MSK: 5/5 strength BUE and BLE. Bilateral LE: no deformities or lesions noted. Hyperpigmentation with red discoloration of bilateral legs to mid-shin. NTP. Full ROM and 5/5 strength. 1+ pitting edema to bilateral mid shin, 2+ distal DP pulses. Several spider and dilated veins noted.  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 Foot exam performed. Discussed restarting stationary bike at least 2 days/week (due to difficulty getting because cost restraints of long drive), with increased activity or light walk with his pups around his neighborhood on other days.   -Cont metformin XR -Discontinue 2-3 times/week glucose checks, not necessary given only on metformin  and well controlled  -Pt to follow up with his ophthalmologist for eye exam  -Will discuss starting Statin at next visit (ASCVD 19.1% risk), patient hesitant to be on too many medications, especially starting all at once, so will discuss this next time  -Consideration for pneumo vaccine at next visit    Chronic pain Chronic. Improved today as patient reports being a good mood this morning. Follows with Dr. Posey Pronto. Patient not taking several medications prescribed by Dr. Posey Pronto during his last visit in 11/2017 due to concerns of starting too many medications at once and does not like to that many medications for fear of disrupting his bowel regimen.  -Recommended starting the Mobic 15mg  daily with food prescribed by Dr. Posey Pronto for pain control, can reassess further need for baclofen or cymbalta in the future if patient willing  -F/u with Dr. Posey Pronto (also discuss replacing TENs unit at that time)  -Continue to do relaxing activities he enjoys as this greatly altered his perception of pain today     Chronic venous stasis dermatitis Chronic. Pt reports a few month history of non-progressive itchiness of his right ankle and mid-shin associated with bilateral dependent edema. Hasn't tried much to improve this other than alcohol wipes. Skin changes consistent with chronic venous stasis and dry skin  overlying region. Likely venous stasis dermatitis, given itchy skin associated with swelling and dilated veins/spider veins noted on exam. Considered cellulitis, however less likely given no fever, induration, or skin changes consistent with this.  -Recommend Vaseline BID  -Start wearing compression stockings, provide website for cheaper stockings  -Elevate legs when able, limit salt intake   Pt is to also continue discussion with his cardiologist for further cardiac workup availability.   F/u in 1-2 months, to also discuss additional concerns (nails irritating him)   Given flu shot today.   Darrelyn Hillock,  DO Family Medicine Resident PGY-1

## 2018-01-03 DIAGNOSIS — I872 Venous insufficiency (chronic) (peripheral): Secondary | ICD-10-CM | POA: Insufficient documentation

## 2018-01-03 HISTORY — DX: Venous insufficiency (chronic) (peripheral): I87.2

## 2018-01-03 NOTE — Assessment & Plan Note (Addendum)
Chronic. Pt reports a few month history of non-progressive itchiness of his right ankle and mid-shin associated with bilateral dependent edema. Hasn't tried much to improve this other than alcohol wipes. Skin changes consistent with chronic venous stasis and dry skin overlying region. Likely venous stasis dermatitis, given itchy skin associated with swelling and dilated veins/spider veins noted on exam. Considered cellulitis, however less likely given no fever, induration, or skin changes consistent with this.  -Recommend Vaseline BID  -Start wearing compression stockings, provide website for cheaper stockings  -Elevate legs when able, limit salt intake

## 2018-01-03 NOTE — Assessment & Plan Note (Addendum)
Well controlled, A1c 6.1 today. Foot exam performed. Discussed restarting stationary bike at least 2 days/week (due to difficulty getting because cost restraints of long drive), with increased activity or light walk with his pups around his neighborhood on other days.   -Cont metformin XR -Discontinue 2-3 times/week glucose checks, not necessary given only on metformin and well controlled  -Pt to follow up with his ophthalmologist for eye exam  -Will discuss starting Statin at next visit (ASCVD 19.1% risk), patient hesitant to be on too many medications, especially starting all at once, so will discuss this next time  -Consideration for pneumo vaccine at next visit

## 2018-01-03 NOTE — Assessment & Plan Note (Signed)
Chronic. Improved today as patient reports being a good mood this morning. Follows with Dr. Posey Pronto. Patient not taking several medications prescribed by Dr. Posey Pronto during his last visit in 11/2017 due to concerns of starting too many medications at once and does not like to that many medications for fear of disrupting his bowel regimen.  -Recommended starting the Mobic 15mg  daily with food prescribed by Dr. Posey Pronto for pain control, can reassess further need for baclofen or cymbalta in the future if patient willing  -F/u with Dr. Posey Pronto (also discuss replacing TENs unit at that time)  -Continue to do relaxing activities he enjoys as this greatly altered his perception of pain today

## 2018-01-10 ENCOUNTER — Other Ambulatory Visit: Payer: Self-pay | Admitting: *Deleted

## 2018-01-10 MED ORDER — LISINOPRIL 40 MG PO TABS: 40.0000 mg | ORAL_TABLET | Freq: Every day | ORAL | 3 refills | Status: DC

## 2018-01-10 MED ORDER — AMLODIPINE BESYLATE 5 MG PO TABS: 5.0000 mg | ORAL_TABLET | Freq: Every day | ORAL | 3 refills | Status: DC

## 2018-01-19 DIAGNOSIS — M542 Cervicalgia: Secondary | ICD-10-CM | POA: Diagnosis not present
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
## 2018-02-05 ENCOUNTER — Ambulatory Visit: Payer: PPO | Admitting: Cardiology

## 2018-02-12 ENCOUNTER — Ambulatory Visit: Payer: PPO | Admitting: Family Medicine

## 2018-02-19 DIAGNOSIS — M542 Cervicalgia: Secondary | ICD-10-CM | POA: Diagnosis not present

## 2018-03-21 DIAGNOSIS — M542 Cervicalgia: Secondary | ICD-10-CM | POA: Diagnosis not present

## 2018-04-10 ENCOUNTER — Telehealth (HOSPITAL_COMMUNITY): Payer: Self-pay

## 2018-04-10 ENCOUNTER — Telehealth: Payer: Self-pay | Admitting: Family Medicine

## 2018-04-10 ENCOUNTER — Encounter (HOSPITAL_COMMUNITY): Payer: Self-pay

## 2018-04-10 ENCOUNTER — Ambulatory Visit (HOSPITAL_COMMUNITY)
Admission: EM | Admit: 2018-04-10 | Discharge: 2018-04-10 | Disposition: A | Discharge status: Home / Self Care | Payer: PPO | Attending: Family Medicine | Admitting: Family Medicine

## 2018-04-10 DIAGNOSIS — R69 Illness, unspecified: Secondary | ICD-10-CM

## 2018-04-10 DIAGNOSIS — H1033 Unspecified acute conjunctivitis, bilateral: Secondary | ICD-10-CM

## 2018-04-10 DIAGNOSIS — R05 Cough: Secondary | ICD-10-CM | POA: Insufficient documentation

## 2018-04-10 DIAGNOSIS — J111 Influenza due to unidentified influenza virus with other respiratory manifestations: Secondary | ICD-10-CM

## 2018-04-10 DIAGNOSIS — R059 Cough, unspecified: Secondary | ICD-10-CM

## 2018-04-10 MED ORDER — OLOPATADINE HCL 0.1 % OP SOLN: 1.0000 [drp] | Freq: Two times a day (BID) | OPHTHALMIC | 0 refills | Status: DC

## 2018-04-10 MED ORDER — TOBRAMYCIN 0.3 % OP SOLN: 1.0000 [drp] | Freq: Four times a day (QID) | OPHTHALMIC | 0 refills | Status: DC

## 2018-04-10 MED ORDER — HYDROCODONE-HOMATROPINE 5-1.5 MG/5ML PO SYRP: 5.0000 mL | ORAL_SOLUTION | Freq: Four times a day (QID) | ORAL | 0 refills | Status: DC | PRN

## 2018-04-10 NOTE — Discharge Instructions (Signed)
Please call your eye doctor if you feel your eyes are getting worse over the next 24 hours.

## 2018-04-10 NOTE — ED Triage Notes (Signed)
Pt presents with complaints of cough x 2 weeks and his eyes are red and itchy x 2 days.

## 2018-04-10 NOTE — Telephone Encounter (Signed)
After Hours/Emergency Line Call  Received call from patient's wife regarding patient experiencing 5 days of nonproductive cough, erythematous conjunctiva, nasal congestion.  Wife denies patient experiencing fevers chills, shortness of breath or chest pain.  She is requesting an antibiotic over the phone.  Patient has not been evaluated since the onset of his illness.  He has received a flu shot this year.  Wife feels that he overall looks well but his cough is been bothersome.  Reviewed red flags.  Advised to go to urgent care today to be assessed.  Patient's wife would like to wait and have appointment in our clinic on 1/2.  Scheduled with Dr. Ardelia Mems at 2:10 PM.  Forward to PCP.  Harriet Butte, [place], PGY-3

## 2018-04-11 ENCOUNTER — Ambulatory Visit: Payer: PPO | Admitting: Family Medicine

## 2018-04-11 DIAGNOSIS — E119 Type 2 diabetes mellitus without complications: Secondary | ICD-10-CM | POA: Diagnosis not present

## 2018-04-11 DIAGNOSIS — H18463 Peripheral corneal degeneration, bilateral: Secondary | ICD-10-CM | POA: Diagnosis not present

## 2018-04-11 DIAGNOSIS — H40013 Open angle with borderline findings, low risk, bilateral: Secondary | ICD-10-CM | POA: Diagnosis not present

## 2018-04-11 DIAGNOSIS — H20013 Primary iridocyclitis, bilateral: Secondary | ICD-10-CM | POA: Diagnosis not present

## 2018-04-11 DIAGNOSIS — H16423 Pannus (corneal), bilateral: Secondary | ICD-10-CM | POA: Diagnosis not present

## 2018-04-12 ENCOUNTER — Telehealth: Payer: Self-pay

## 2018-04-12 DIAGNOSIS — E119 Type 2 diabetes mellitus without complications: Secondary | ICD-10-CM | POA: Diagnosis not present

## 2018-04-12 DIAGNOSIS — H18463 Peripheral corneal degeneration, bilateral: Secondary | ICD-10-CM | POA: Diagnosis not present

## 2018-04-12 DIAGNOSIS — H40013 Open angle with borderline findings, low risk, bilateral: Secondary | ICD-10-CM | POA: Diagnosis not present

## 2018-04-12 DIAGNOSIS — H20013 Primary iridocyclitis, bilateral: Secondary | ICD-10-CM | POA: Diagnosis not present

## 2018-04-12 NOTE — Telephone Encounter (Signed)
Patient wife, Lanelle Bal, called. Patient was unable to get his cough medicine prescribed by [facility] on 04/10/18 because pharmacy was closed.  She is asking [facility] [address] Patient is still coughing.  Call back is [phone]  Danley Danker, RN [facility] RN)
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
## 2018-04-15 ENCOUNTER — Other Ambulatory Visit: Payer: Self-pay | Admitting: Family Medicine

## 2018-04-15 DIAGNOSIS — H16103 Unspecified superficial keratitis, bilateral: Secondary | ICD-10-CM | POA: Diagnosis not present

## 2018-04-15 DIAGNOSIS — R059 Cough, unspecified: Secondary | ICD-10-CM

## 2018-04-15 DIAGNOSIS — H01024 Squamous blepharitis left upper eyelid: Secondary | ICD-10-CM | POA: Diagnosis not present

## 2018-04-15 DIAGNOSIS — H01021 Squamous blepharitis right upper eyelid: Secondary | ICD-10-CM | POA: Diagnosis not present

## 2018-04-15 DIAGNOSIS — H01025 Squamous blepharitis left lower eyelid: Secondary | ICD-10-CM | POA: Diagnosis not present

## 2018-04-15 DIAGNOSIS — R05 Cough: Secondary | ICD-10-CM

## 2018-04-15 DIAGNOSIS — H01022 Squamous blepharitis right lower eyelid: Secondary | ICD-10-CM | POA: Diagnosis not present

## 2018-04-15 DIAGNOSIS — H18463 Peripheral corneal degeneration, bilateral: Secondary | ICD-10-CM | POA: Diagnosis not present

## 2018-04-15 MED ORDER — BENZONATATE 200 MG PO CAPS: 200.0000 mg | ORAL_CAPSULE | Freq: Three times a day (TID) | ORAL | 0 refills | Status: DC | PRN

## 2018-04-15 NOTE — Progress Notes (Signed)
Patient seen in the ED recently, was prescribed cough medicine.  His pharmacy did not have that specific medication, is requesting a cough medicine to be sent.  [facility] [address] [facility].

## 2018-04-16 NOTE — Telephone Encounter (Signed)
Completed under separate encounter. Alisa Brake, RN ([facility] RN)  

## 2018-04-17 DIAGNOSIS — H18893 Other specified disorders of cornea, bilateral: Secondary | ICD-10-CM | POA: Diagnosis not present

## 2018-04-21 DIAGNOSIS — M542 Cervicalgia: Secondary | ICD-10-CM | POA: Diagnosis not present

## 2018-05-22 DIAGNOSIS — M542 Cervicalgia: Secondary | ICD-10-CM | POA: Diagnosis not present

## 2018-06-18 ENCOUNTER — Other Ambulatory Visit: Payer: Self-pay

## 2018-06-18 ENCOUNTER — Encounter (HOSPITAL_COMMUNITY): Payer: Self-pay

## 2018-06-18 ENCOUNTER — Ambulatory Visit (HOSPITAL_COMMUNITY)
Admission: EM | Admit: 2018-06-18 | Discharge: 2018-06-18 | Disposition: A | Discharge status: Home / Self Care | Payer: PPO | Attending: Family Medicine | Admitting: Family Medicine

## 2018-06-18 DIAGNOSIS — J019 Acute sinusitis, unspecified: Secondary | ICD-10-CM

## 2018-06-18 DIAGNOSIS — R059 Cough, unspecified: Secondary | ICD-10-CM

## 2018-06-18 DIAGNOSIS — R05 Cough: Secondary | ICD-10-CM

## 2018-06-18 MED ORDER — CETIRIZINE HCL 10 MG PO TABS: 10.0000 mg | ORAL_TABLET | Freq: Every day | ORAL | 0 refills | Status: DC

## 2018-06-18 MED ORDER — AMOXICILLIN-POT CLAVULANATE 875-125 MG PO TABS: 1.0000 | ORAL_TABLET | Freq: Two times a day (BID) | ORAL | 0 refills | Status: AC

## 2018-06-18 MED ORDER — BENZONATATE 100 MG PO CAPS: 100.0000 mg | ORAL_CAPSULE | Freq: Three times a day (TID) | ORAL | 0 refills | Status: DC

## 2018-06-18 MED ORDER — FLUTICASONE PROPIONATE 50 MCG/ACT NA SUSP: 2.0000 | Freq: Every day | NASAL | 0 refills | Status: DC

## 2018-06-18 NOTE — ED Triage Notes (Signed)
Pt cc coughing x 2 weeks.

## 2018-06-18 NOTE — Discharge Instructions (Addendum)
Rest and push fluids Zyrtec and flonase prescribed.  Use daily for symptomatic relief Augmentin prescribed.  Take as directed and to completion Tessalon perles as needed for cough Continue with OTC ibuprofen/tylenol as needed for pain Follow up with PCP if symptoms persists Return or go to the ED if you have any new or worsening symptoms such as fever, chills, worsening sinus pain/pressure, cough, sore throat, chest pain, shortness of breath, abdominal pain, changes in bowel or bladder habits, etc..[name]
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
## 2018-06-20 DIAGNOSIS — M542 Cervicalgia: Secondary | ICD-10-CM | POA: Diagnosis not present

## 2018-07-04 ENCOUNTER — Other Ambulatory Visit: Payer: Self-pay | Admitting: *Deleted

## 2018-07-04 MED ORDER — METFORMIN HCL ER 500 MG PO TB24: 500.0000 mg | ORAL_TABLET | Freq: Every day | ORAL | 3 refills | Status: DC

## 2018-07-21 DIAGNOSIS — M542 Cervicalgia: Secondary | ICD-10-CM | POA: Diagnosis not present

## 2018-08-20 DIAGNOSIS — M542 Cervicalgia: Secondary | ICD-10-CM | POA: Diagnosis not present

## 2018-08-28 ENCOUNTER — Encounter: Payer: Self-pay | Admitting: Internal Medicine

## 2018-09-20 DIAGNOSIS — M542 Cervicalgia: Secondary | ICD-10-CM | POA: Diagnosis not present

## 2018-10-20 DIAGNOSIS — M542 Cervicalgia: Secondary | ICD-10-CM | POA: Diagnosis not present

## 2018-11-12 ENCOUNTER — Ambulatory Visit: Payer: PPO | Admitting: Family Medicine

## 2018-11-19 ENCOUNTER — Other Ambulatory Visit: Payer: Self-pay

## 2018-11-19 ENCOUNTER — Ambulatory Visit (INDEPENDENT_AMBULATORY_CARE_PROVIDER_SITE_OTHER): Payer: PPO | Admitting: Family Medicine

## 2018-11-19 VITALS — BP 110/70 | HR 96 | Ht 71.0 in | Wt 241.6 lb

## 2018-11-19 DIAGNOSIS — M79671 Pain in right foot: Secondary | ICD-10-CM

## 2018-11-19 DIAGNOSIS — Z0000 Encounter for general adult medical examination without abnormal findings: Secondary | ICD-10-CM

## 2018-11-19 DIAGNOSIS — E1149 Type 2 diabetes mellitus with other diabetic neurological complication: Secondary | ICD-10-CM | POA: Diagnosis not present

## 2018-11-19 DIAGNOSIS — IMO0001 Reserved for inherently not codable concepts without codable children: Secondary | ICD-10-CM

## 2018-11-19 LAB — POCT GLYCOSYLATED HEMOGLOBIN (HGB A1C): HbA1c, POC (controlled diabetic range): 6.3 % (ref 0.0–7.0)

## 2018-11-19 NOTE — Patient Instructions (Signed)
Wonderful to see you today as always.  Continue your metformin as is.  Additionally try to add some physical activity into your daily regimen like we discussed and try to reduce your amount of sweets daily.  Also, continue to look into consider starting a statin to help reduce your risk of cardiovascular disease.  Please continue to monitoring the area that tingles on your foot.  I would like to see you back in approximately 3 months, we will evaluate for any progression at that time.  Please follow-up sooner if this area is worsening.

## 2018-11-19 NOTE — Progress Notes (Signed)
Subjective:    Patient ID: Cameron Diaz, male    DOB: 1960-11-21, 58 y.o.   MRN: [id number]   CC: diabetes f/u   HPI: [name] is a 58 year old gentleman presenting discuss the following:  Diabetes follow up:  Last A1c 6.1 on 02/2018 Taking medications: metformin 500mg  daily.  Glucose average: Does not check his glucose. Hypoglycemic symptoms?  None. Exercise: Stopped exercising due to the heat and COVID pandemic closed his gym, enjoyed stationary biking Diet: During quarantine has been eating a lot more sweets, otherwise well-balanced Last eye exam: Last June 2018, no retinopathy visualized at that time. Last foot exam: Last in 12/2017. Pneumococcal vaccine?  Due for this. ROS: denies dizziness, diaphoresis, LOC, polyuria, polydipsia  Foot tingling: Additionally complains of a small region on his right great toe and slightly beneath it that will occasionally tingle for a few minutes at a time.  Has been happening for the past several months, has not progressed since onset.  Denies any numbness, extremity weakness, worsening back pain in the setting of known chronic back pain, bowel/bladder dysfunction, saddle anesthesia.  Denies any tingling elsewhere.  Does seem to notice it more when he is standing on his feet.  This is not impacting his ability to do his activities of daily living, mildly bothersome.  Smoking status reviewed  Review of Systems Per HPI, also denies recent illness, fever, headache, changes in vision, chest pain, shortness of breath, abdominal pain, N/V/D, weakness   Patient Active Problem List   Diagnosis Date Noted  . Foot pain, right 11/21/2018  . Healthcare maintenance 11/21/2018  . 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 Myalgia 11/08/2017  . Atypical chest pain 10/30/2017  . Trochanteric bursitis of right hip 10/30/2017  . Umbilical hernia without obstruction and without gangrene 10/30/2017  . Knee pain, right 10/30/2017  . Family history of  colon cancer in father 09/21/2017  . Family history of pancreatic cancer - brother and grandfather 09/21/2017  . Bilateral leg edema 02/28/2017  . Abdominal pain 05/08/2016  . Erectile dysfunction 02/09/2016  . Neck pain 12/21/2015  . Depression 02/20/2015  . Foot callus 09/08/2014  . Hyperlipidemia 08/26/2014  . Essential hypertension 08/24/2014  . 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 Psoriasis 08/24/2014  . Constipation 08/24/2014  . Insomnia 08/24/2014  . Chronic pain 08/24/2014  . H/O alcohol abuse 08/24/2014  . H/O drug abuse ([facility]) 08/24/2014  . History of colonic polyps numerous and large hyperplastics 09/11/2011     Objective:  BP 110/70   Pulse 96   Ht 5\' 11"  (1.803 m)   Wt 241 lb 9.6 oz (109.6 kg)   SpO2 96%   BMI 33.70 kg/m  Vitals and nursing note reviewed  General: NAD, pleasant Cardiac: RRR, normal heart sounds, no murmurs Respiratory: CTAB, normal effort Abdomen: soft, nontender, nondistended Extremities: no edema or cyanosis. WWP. MSK: No deformities, open lesions, or rashes noted in the bilateral lower extremity.  5/5 lower extremity strength bilaterally.  Monofilament testing performed bilaterally with normal sensation throughout.  Patient endorses region of tingling sensation on right foot is present mainly in the great toe and approximately 1 inch down onto the ball of his foot, no involvement dorsally on the foot or elsewhere. Skin: warm and dry, no rashes noted.  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  Discussed alternative  methods to increasing physical activity throughout the day despite no gyms currently opened and taking in sweets in moderation. - Continue metformin - Only check glucose as needed if symptomatic - Discuss statin initiation due to ASCVD 10-year risk of 19.1%, patient is interested in doing more research on his own and would like to wait - Performed foot exam today, unremarkable - Ophthalmology referral placed - Will need to discuss pneumococcal vaccine on follow-up  Foot pain, right Tingling sensation within small region on right foot, worse with standing.  Unclear etiology, suspect may be MSK in nature. Initially considered diabetic neuropathy, however with atypical distribution and appropriate monofilament testing today, believe this is less likely.  Additionally considered primary radiculopathy from lumbar spine, but does not quite fit any one dermatomal region.  However, given known chronic back pain we will continue to monitor this closely for any progression and consider repeat imaging of his spine if needed.  Lastly, will go ahead and check B12 level given long-term metformin use. Low concern for peripheral neuropathy secondary to liver disease with previous cirrhosis visualized on CT abdomen. - Monitor region for progression - CMP, B12 level - Return precautions discussed, will follow-up in 3 months or sooner if needed  Healthcare maintenance Placed order for routine colonoscopy screening.   Follow-up in 3 months for concerns above, or sooner if needed  [place] Resident PGY-2

## 2018-11-20 ENCOUNTER — Encounter (INDEPENDENT_AMBULATORY_CARE_PROVIDER_SITE_OTHER): Payer: Self-pay | Admitting: Family Medicine

## 2018-11-20 DIAGNOSIS — M542 Cervicalgia: Secondary | ICD-10-CM | POA: Diagnosis not present

## 2018-11-20 LAB — COMPREHENSIVE METABOLIC PANEL
ALT: 43 IU/L (ref 0–44)
AST: 37 IU/L (ref 0–40)
Albumin/Globulin Ratio: 1.3 (ref 1.2–2.2)
Albumin: 4.3 g/dL (ref 3.8–4.9)
Alkaline Phosphatase: 87 IU/L (ref 39–117)
BUN/Creatinine Ratio: 16 (ref 9–20)
BUN: 15 mg/dL (ref 6–24)
Bilirubin Total: 0.4 mg/dL (ref 0.0–1.2)
CO2: 21 mmol/L (ref 20–29)
Calcium: 9.8 mg/dL (ref 8.7–10.2)
Chloride: 102 mmol/L (ref 96–106)
Creatinine, Ser: 0.93 mg/dL (ref 0.76–1.27)
GFR calc Af Amer: 104 mL/min/{1.73_m2} (ref >59)
GFR calc non Af Amer: 90 mL/min/{1.73_m2} (ref >59)
Globulin, Total: 3.3 g/dL (ref 1.5–4.5)
Glucose: 108 mg/dL — ABNORMAL HIGH (ref 65–99)
Potassium: 4.2 mmol/L (ref 3.5–5.2)
Sodium: 136 mmol/L (ref 134–144)
Total Protein: 7.6 g/dL (ref 6.0–8.5)

## 2018-11-20 LAB — VITAMIN B12: Vitamin B-12: 396 pg/mL (ref 232–1245)

## 2018-11-21 ENCOUNTER — Encounter: Payer: Self-pay | Admitting: Family Medicine

## 2018-11-21 DIAGNOSIS — Z0000 Encounter for general adult medical examination without abnormal findings: Secondary | ICD-10-CM | POA: Insufficient documentation

## 2018-11-21 DIAGNOSIS — M79671 Pain in right foot: Secondary | ICD-10-CM

## 2018-11-21 HISTORY — DX: Pain in right foot: M79.671

## 2018-11-21 NOTE — Assessment & Plan Note (Addendum)
Placed order for routine colonoscopy screening.

## 2018-11-21 NOTE — Assessment & Plan Note (Addendum)
Tingling sensation within small region on right foot, worse with standing.  Unclear etiology, suspect may be MSK in nature. Initially considered diabetic neuropathy, however with atypical distribution and appropriate monofilament testing today, believe this is less likely.  Additionally considered primary radiculopathy from lumbar spine, but does not quite fit any one dermatomal region.  However, given known chronic back pain we will continue to monitor this closely for any progression and consider repeat imaging of his spine if needed.  Lastly, will go ahead and check B12 level given long-term metformin use. Low concern for peripheral neuropathy secondary to liver disease with previous cirrhosis visualized on CT abdomen. - Monitor region for progression - CMP, B12 level - Return precautions discussed, will follow-up in 3 months or sooner if needed

## 2018-11-21 NOTE — Assessment & Plan Note (Signed)
Well-controlled, A1c 6.3 today.  Discussed alternative methods to increasing physical activity throughout the day despite no gyms currently opened and taking in sweets in moderation. - Continue metformin - Only check glucose as needed if symptomatic - Discuss statin initiation due to ASCVD 10-year risk of 19.1%, patient is interested in doing more research on his own and would like to wait - Performed foot exam today, unremarkable - Ophthalmology referral placed - Will need to discuss pneumococcal vaccine on follow-up

## 2018-11-22 ENCOUNTER — Encounter: Payer: Self-pay | Admitting: Internal Medicine

## 2018-12-11 ENCOUNTER — Ambulatory Visit (AMBULATORY_SURGERY_CENTER): Payer: Self-pay | Admitting: *Deleted

## 2018-12-11 ENCOUNTER — Other Ambulatory Visit: Payer: Self-pay

## 2018-12-11 ENCOUNTER — Encounter: Payer: Self-pay | Admitting: Internal Medicine

## 2018-12-11 ENCOUNTER — Telehealth: Payer: Self-pay | Admitting: *Deleted

## 2018-12-11 VITALS — Temp 96.9°F | Ht 71.0 in | Wt 241.0 lb

## 2018-12-11 DIAGNOSIS — Z86010 Personal history of colonic polyps: Secondary | ICD-10-CM

## 2018-12-11 NOTE — Telephone Encounter (Signed)
Message left for pt. To call back and reschedule nurse visit # left to call back by end of day or procedure for 12/25/18 will have to be cancelled

## 2018-12-11 NOTE — Telephone Encounter (Signed)
Pt. Called back and rescheduled nurse visit for today @ p.m.

## 2018-12-11 NOTE — Progress Notes (Signed)
Patient is here in-person for PV. Patient denies any allergies to eggs or soy. Patient denies any problems with anesthesia/sedation. Patient denies any oxygen use at home. Patient denies taking any diet/weight loss medications or blood thinners.Pt is aware that care partner will wait in the car during procedure; if they feel like they will be too hot to wait in the car; they may wait in the lobby.  We want them to wear a mask (we do not have any that we can provide them), practice social distancing, and we will check their temperatures when they get here.  I did remind patient that their care partner needs to stay in the parking lot the entire time. Pt will wear mask into building.

## 2018-12-25 ENCOUNTER — Encounter: Payer: PPO | Admitting: Internal Medicine

## 2018-12-31 ENCOUNTER — Telehealth: Payer: Self-pay

## 2018-12-31 ENCOUNTER — Telehealth: Payer: Self-pay | Admitting: Internal Medicine

## 2018-12-31 NOTE — Telephone Encounter (Signed)
Covid-19 screening questions   Do you now or have you had a fever in the last 14 days? NO   Do you have any respiratory symptoms of shortness of breath or cough now or in the last 14 days? NO  Do you have any family members or close contacts with diagnosed or suspected Covid-19 in the past 14 days? NO  Have you been tested for Covid-19 and found to be positive? NO        

## 2018-12-31 NOTE — Telephone Encounter (Signed)
Pt has questions regarding colon prep tomorrow.

## 2018-12-31 NOTE — Telephone Encounter (Signed)
Spoke to patient about prep instructions. Patient understands to be on clear liquid diet today in preporation for procedure tomorrow.

## 2019-01-01 ENCOUNTER — Other Ambulatory Visit: Payer: Self-pay | Admitting: Internal Medicine

## 2019-01-01 ENCOUNTER — Encounter: Payer: Self-pay | Admitting: Internal Medicine

## 2019-01-01 ENCOUNTER — Ambulatory Visit (AMBULATORY_SURGERY_CENTER): Payer: PPO | Admitting: Internal Medicine

## 2019-01-01 ENCOUNTER — Other Ambulatory Visit: Payer: Self-pay

## 2019-01-01 VITALS — BP 114/78 | HR 72 | Temp 98.4°F | Resp 16 | Ht 71.0 in | Wt 241.0 lb

## 2019-01-01 DIAGNOSIS — D127 Benign neoplasm of rectosigmoid junction: Secondary | ICD-10-CM | POA: Diagnosis not present

## 2019-01-01 DIAGNOSIS — D123 Benign neoplasm of transverse colon: Secondary | ICD-10-CM

## 2019-01-01 DIAGNOSIS — D128 Benign neoplasm of rectum: Secondary | ICD-10-CM | POA: Diagnosis not present

## 2019-01-01 DIAGNOSIS — Z860100 Personal history of colon polyps, unspecified: Secondary | ICD-10-CM

## 2019-01-01 DIAGNOSIS — D129 Benign neoplasm of anus and anal canal: Secondary | ICD-10-CM

## 2019-01-01 DIAGNOSIS — Z1211 Encounter for screening for malignant neoplasm of colon: Secondary | ICD-10-CM | POA: Diagnosis not present

## 2019-01-01 DIAGNOSIS — D125 Benign neoplasm of sigmoid colon: Secondary | ICD-10-CM | POA: Diagnosis not present

## 2019-01-01 DIAGNOSIS — K635 Polyp of colon: Secondary | ICD-10-CM

## 2019-01-01 MED ORDER — SODIUM CHLORIDE 0.9 % IV SOLN: 500.0000 mL | Freq: Once | INTRAVENOUS | Status: DC

## 2019-01-01 NOTE — Patient Instructions (Addendum)
I found and removed 10 tiny polyps today.  I will let you know pathology results and when to have another routine colonoscopy by mail and/or My Chart.  Nothing looks like cancer.  I appreciate the opportunity to care for you. Gatha Mayer, MD, FACG YOU HAD AN ENDOSCOPIC PROCEDURE TODAY AT [facility]:   Refer to the procedure report that was given to you for any specific questions about what was found during the examination.  If the procedure report does not answer your questions, please call your gastroenterologist to clarify.  If you requested that your care partner not be given the details of your procedure findings, then the procedure report has been included in a sealed envelope for you to review at your convenience later.  YOU SHOULD EXPECT: Some feelings of bloating in the abdomen. Passage of more gas than usual.  Walking can help get rid of the air that was put into your GI tract during the procedure and reduce the bloating. If you had a lower endoscopy (such as a colonoscopy or flexible sigmoidoscopy) you may notice spotting of blood in your stool or on the toilet paper. If you underwent a bowel prep for your procedure, you may not have a normal bowel movement for a few days.  Please Note:  You might notice some irritation and congestion in your nose or some drainage.  This is from the oxygen used during your procedure.  There is no need for concern and it should clear up in a day or so.  SYMPTOMS TO REPORT IMMEDIATELY:   Following lower endoscopy (colonoscopy or flexible sigmoidoscopy):  Excessive amounts of blood in the stool  Significant tenderness or worsening of abdominal pains  Swelling of the abdomen that is new, acute  Fever of 100F or higher   For urgent or emergent issues, a gastroenterologist can be reached at any hour by calling [phone].   DIET:  We do recommend a small meal at first, but then you may proceed to your regular diet.  Drink  plenty of fluids but you should avoid alcoholic beverages for 24 hours.  MEDICATIONS: Continue present medications.  Please see handouts given to you by your recovery nurse.  ACTIVITY:  You should plan to take it easy for the rest of today and you should NOT DRIVE or use heavy machinery until tomorrow (because of the sedation medicines used during the test).    FOLLOW UP: Our staff will call the number listed on your records 48-72 hours following your procedure to check on you and address any questions or concerns that you may have regarding the information given to you following your procedure. If we do not reach you, we will leave a message.  We will attempt to reach you two times.  During this call, we will ask if you have developed any symptoms of COVID 19. If you develop any symptoms (ie: fever, flu-like symptoms, shortness of breath, cough etc.) before then, please call [phone].  If you test positive for Covid 19 in the 2 weeks post procedure, please call and report this information to [place].    If any biopsies were taken you will be contacted by phone or by letter within the next 1-3 weeks.  Please call us at [phone] if you have not heard about the biopsies in 3 weeks.   Thank you for allowing [place] to provide for your healthcare needs today.   SIGNATURES/CONFIDENTIALITY: You and/or your care partner have signed  paperwork which will be entered into your electronic medical record.  These signatures attest to the fact that that the information above on your After Visit Summary has been reviewed and is understood.  Full responsibility of the confidentiality of this discharge information lies with you and/or your care-partner.

## 2019-01-01 NOTE — Progress Notes (Signed)
Temperature taken by K.A., VS taken by C.W. 

## 2019-01-01 NOTE — Progress Notes (Signed)
PT taken to PACU. Monitors in place. VSS. Report given to RN. 

## 2019-01-01 NOTE — Progress Notes (Signed)
Called to room to assist during endoscopic procedure.  Patient ID and intended procedure confirmed with present staff. Received instructions for my participation in the procedure from the performing physician.  

## 2019-01-01 NOTE — Progress Notes (Signed)
Pt's states no medical or surgical changes since previsit or office visit. 

## 2019-01-01 NOTE — Op Note (Signed)
[facility] Patient Name: Cameron Diaz Procedure Date: 01/01/2019 11:46 AM MRN: [id number] Endoscopist: Gatha Mayer , MD Age: 58 Referring MD:  Date of Birth: 1961-03-14 Gender: Male Account #: [id number] Procedure:                Colonoscopy Indications:              High risk colon cancer surveillance: Personal                            history of Serrated Polyposis Syndrome, Family                            history of colon cancer in a first-degree relative                            before age 24 years Medicines:                Propofol per Anesthesia, Monitored Anesthesia Care Procedure:                Pre-Anesthesia Assessment:                           - Prior to the procedure, a History and Physical                            was performed, and patient medications and                            allergies were reviewed. The patient's tolerance of                            previous anesthesia was also reviewed. The risks                            and benefits of the procedure and the sedation                            options and risks were discussed with the patient.                            All questions were answered, and informed consent                            was obtained. Prior Anticoagulants: The patient has                            taken no previous anticoagulant or antiplatelet                            agents. ASA Grade Assessment: II - A patient with                            mild systemic disease. After reviewing the risks  and benefits, the patient was deemed in                            satisfactory condition to undergo the procedure.                           After obtaining informed consent, the colonoscope                            was passed under direct vision. Throughout the                            procedure, the patient's blood pressure, pulse, and                            oxygen saturations were  monitored continuously. The                            Colonoscope was introduced through the anus and                            advanced to the the cecum, identified by                            appendiceal orifice and ileocecal valve. The                            colonoscopy was performed without difficulty. The                            patient tolerated the procedure well. The quality                            of the bowel preparation was adequate. The                            ileocecal valve, appendiceal orifice, and rectum                            were photographed. The bowel preparation used was                            Miralax via split dose instruction. Scope In: 11:49:39 AM Scope Out: 12:08:13 PM Scope Withdrawal Time: 0 hours 17 minutes 0 seconds  Total Procedure Duration: 0 hours 18 minutes 34 seconds  Findings:                 The perianal and digital rectal examinations were                            normal. Pertinent negatives include normal prostate                            (size, shape, and consistency).  Seven sessile polyps were found in the rectum,                            sigmoid colon and descending colon. The polyps were                            diminutive in size. These polyps were removed with                            a cold snare. Resection and retrieval were                            complete. Verification of patient identification                            for the specimen was done. Estimated blood loss was                            minimal.                           Three sessile polyps were found in the transverse                            colon. The polyps were 1 to 2 mm in size. These                            polyps were removed with a cold biopsy forceps.                            Resection and retrieval were complete. Verification                            of patient identification for the specimen was                             done. Estimated blood loss was minimal.                           The exam was otherwise without abnormality on                            direct and retroflexion views. Complications:            No immediate complications. Estimated Blood Loss:     Estimated blood loss was minimal. Impression:               - Seven diminutive polyps in the rectum, in the                            sigmoid colon and in the descending colon, removed                            with a cold snare. Resected and retrieved.                           -  Three 1 to 2 mm polyps in the transverse colon,                            removed with a cold biopsy forceps. Resected and                            retrieved.                           Multiple 1-2 mm rectal and some distal sigmoid                            polyps also seen as in past - is on frequent                            surveillance so not removed                           - The examination was otherwise normal on direct                            and retroflexion views. Recommendation:           - Patient has a contact number available for                            emergencies. The signs and symptoms of potential                            delayed complications were discussed with the                            patient. Return to normal activities tomorrow.                            Written discharge instructions were provided to the                            patient.                           - Resume previous diet.                           - Continue present medications.                           - Repeat colonoscopy is recommended for                            surveillance. The colonoscopy date will be                            determined after pathology results from today's  exam become available for review. Gatha Mayer, MD 01/01/2019 12:24:16 PM This report has been signed  electronically.

## 2019-01-03 ENCOUNTER — Telehealth: Payer: Self-pay

## 2019-01-03 NOTE — Telephone Encounter (Signed)
  Follow up Call-  Call back number 01/01/2019 09/21/2017  Post procedure Call Back phone  # [phone]  Permission to leave phone message Yes Yes  Some recent data might be hidden     Left message

## 2019-01-03 NOTE — Telephone Encounter (Signed)
Covid-19 screening questions   Do you now or have you had a fever in the last 14 days?  No.  Do you have any respiratory symptoms of shortness of breath or cough now or in the last 14 days? No. Do you have any family members or close contacts with diagnosed or suspected Covid-19 in the past 14 days? No.  Have you been tested for Covid-19 and found to be positive? No.       Follow up Call-  Call back number 01/01/2019 09/21/2017  Post procedure Call Back phone  # [phone]  Permission to leave phone message Yes Yes  Some recent data might be hidden     Patient questions:  Do you have a fever, pain , or abdominal swelling? No. Pain Score  0 *  Have you tolerated food without any problems? Yes.    Have you been able to return to your normal activities? Yes.    Do you have any questions about your discharge instructions: Diet   No. Medications  No. Follow up visit  No.  Do you have questions or concerns about your Care? No.  Actions: * If pain score is 4 or above: No action needed, pain <4.

## 2019-01-11 ENCOUNTER — Encounter: Payer: Self-pay | Admitting: Internal Medicine

## 2019-01-11 NOTE — Progress Notes (Signed)
10 hyperplastic/inflammatory polyps Recall 2022 My Chart

## 2019-01-16 DIAGNOSIS — H16423 Pannus (corneal), bilateral: Secondary | ICD-10-CM | POA: Diagnosis not present

## 2019-01-16 DIAGNOSIS — H169 Unspecified keratitis: Secondary | ICD-10-CM | POA: Diagnosis not present

## 2019-01-16 DIAGNOSIS — H209 Unspecified iridocyclitis: Secondary | ICD-10-CM | POA: Diagnosis not present

## 2019-01-21 ENCOUNTER — Other Ambulatory Visit: Payer: Self-pay | Admitting: Family Medicine

## 2019-03-10 ENCOUNTER — Emergency Department (HOSPITAL_COMMUNITY): Payer: PPO

## 2019-03-10 ENCOUNTER — Encounter (HOSPITAL_COMMUNITY): Payer: Self-pay | Admitting: Emergency Medicine

## 2019-03-10 ENCOUNTER — Emergency Department (HOSPITAL_COMMUNITY)
Admission: EM | Admit: 2019-03-10 | Discharge: 2019-03-10 | Disposition: A | Discharge status: Home / Self Care | Payer: PPO | Attending: Emergency Medicine | Admitting: Emergency Medicine

## 2019-03-10 ENCOUNTER — Other Ambulatory Visit: Payer: Self-pay

## 2019-03-10 DIAGNOSIS — R52 Pain, unspecified: Secondary | ICD-10-CM | POA: Diagnosis not present

## 2019-03-10 DIAGNOSIS — E119 Type 2 diabetes mellitus without complications: Secondary | ICD-10-CM | POA: Insufficient documentation

## 2019-03-10 DIAGNOSIS — Z209 Contact with and (suspected) exposure to unspecified communicable disease: Secondary | ICD-10-CM | POA: Diagnosis not present

## 2019-03-10 DIAGNOSIS — R05 Cough: Secondary | ICD-10-CM | POA: Diagnosis not present

## 2019-03-10 DIAGNOSIS — Z7984 Long term (current) use of oral hypoglycemic drugs: Secondary | ICD-10-CM | POA: Diagnosis not present

## 2019-03-10 DIAGNOSIS — R519 Headache, unspecified: Secondary | ICD-10-CM | POA: Diagnosis present

## 2019-03-10 DIAGNOSIS — Z87891 Personal history of nicotine dependence: Secondary | ICD-10-CM | POA: Insufficient documentation

## 2019-03-10 DIAGNOSIS — I10 Essential (primary) hypertension: Secondary | ICD-10-CM | POA: Insufficient documentation

## 2019-03-10 DIAGNOSIS — R0902 Hypoxemia: Secondary | ICD-10-CM | POA: Diagnosis not present

## 2019-03-10 DIAGNOSIS — G4489 Other headache syndrome: Secondary | ICD-10-CM | POA: Diagnosis not present

## 2019-03-10 DIAGNOSIS — Z79899 Other long term (current) drug therapy: Secondary | ICD-10-CM | POA: Insufficient documentation

## 2019-03-10 DIAGNOSIS — E1165 Type 2 diabetes mellitus with hyperglycemia: Secondary | ICD-10-CM | POA: Diagnosis not present

## 2019-03-10 DIAGNOSIS — U071 COVID-19: Secondary | ICD-10-CM | POA: Diagnosis not present

## 2019-03-10 LAB — BASIC METABOLIC PANEL
Anion gap: 9 (ref 5–15)
BUN: 10 mg/dL (ref 6–20)
CO2: 21 mmol/L — ABNORMAL LOW (ref 22–32)
Calcium: 9.4 mg/dL (ref 8.9–10.3)
Chloride: 108 mmol/L (ref 98–111)
Creatinine, Ser: 0.92 mg/dL (ref 0.61–1.24)
GFR calc Af Amer: >60 mL/min (ref >60)
GFR calc non Af Amer: >60 mL/min (ref >60)
Glucose, Bld: 84 mg/dL (ref 70–99)
Potassium: 3.7 mmol/L (ref 3.5–5.1)
Sodium: 138 mmol/L (ref 135–145)

## 2019-03-10 LAB — CBC
HCT: 44.6 % (ref 39.0–52.0)
Hemoglobin: 16.5 g/dL (ref 13.0–17.0)
MCH: 30.3 pg (ref 26.0–34.0)
MCHC: 37 g/dL — ABNORMAL HIGH (ref 30.0–36.0)
MCV: 82 fL (ref 80.0–100.0)
Platelets: 246 10*3/uL (ref 150–400)
RBC: 5.44 MIL/uL (ref 4.22–5.81)
RDW: 12.4 % (ref 11.5–15.5)
WBC: 4.9 10*3/uL (ref 4.0–10.5)
nRBC: 0 % (ref 0.0–0.2)

## 2019-03-10 LAB — POC SARS CORONAVIRUS 2 AG -  ED: SARS Coronavirus 2 Ag: POSITIVE — AB

## 2019-03-10 MED ORDER — ALBUTEROL SULFATE HFA 108 (90 BASE) MCG/ACT IN AERS
2.0000 | INHALATION_SPRAY | Freq: Once | RESPIRATORY_TRACT | Status: AC
  Administered 2019-03-10: 2 via RESPIRATORY_TRACT
  Filled 2019-03-10: qty 6.7

## 2019-03-10 MED ORDER — IBUPROFEN 400 MG PO TABS
600.0000 mg | ORAL_TABLET | Freq: Once | ORAL | Status: AC
  Administered 2019-03-10: 17:00:00 600 mg via ORAL
  Filled 2019-03-10: qty 1

## 2019-03-10 MED ORDER — BENZONATATE 100 MG PO CAPS: 100.0000 mg | ORAL_CAPSULE | Freq: Three times a day (TID) | ORAL | 0 refills | Status: DC
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
## 2019-03-10 NOTE — ED Triage Notes (Signed)
Pt arrives via EMS from home with cough, headache, generalized bodyaches for the last 3 days. VSS in transport. Exposure to family with [place].

## 2019-03-10 NOTE — ED Notes (Signed)
Pt told wife called to check in on him and pt plans to call wife once in a room.

## 2019-03-10 NOTE — ED Notes (Signed)
Wife, Lanelle Bal, to be called with updates about husband. Phone number is [phone]

## 2019-03-10 NOTE — ED Notes (Signed)
Patient verbalizes understanding of discharge instructions. Opportunity for questioning and answers were provided. Armband removed by staff, pt discharged from ED ambulatory to home.  

## 2019-03-10 NOTE — Discharge Instructions (Addendum)
You have tested POSITIVE for COVID 19 today. Please stay home and self isolate for 2 weeks starting today. (Cleared: 03/25/2019).   Please take medication as prescribed to help with cough. Use inhaler as needed for shortness of breath. Follow up with your PCP.   Return to the ED for any chest pain or shortness of breath.     Person Under Monitoring Name: Cameron Diaz  Location: [address] [place] [address]   Infection Prevention Recommendations for Individuals Confirmed to have, or Being Evaluated for, 2019 Novel Coronavirus (COVID-19) Infection Who Receive Care at Home  Individuals who are confirmed to have, or are being evaluated for, COVID-19 should follow the prevention steps below until a healthcare provider or local or state health department says they can return to normal activities.  Stay home except to get medical care You should restrict activities outside your home, except for getting medical care. Do not go to work, school, or public areas, and do not use public transportation or taxis.  Call ahead before visiting your doctor Before your medical appointment, call the healthcare provider and tell them that you have, or are being evaluated for, COVID-19 infection. This will help the healthcare providers office take steps to keep other people from getting infected. Ask your healthcare provider to call the local or state health department.  Monitor your symptoms Seek prompt medical attention if your illness is worsening (e.g., difficulty breathing). Before going to your medical appointment, call the healthcare provider and tell them that you have, or are being evaluated for, COVID-19 infection. Ask your healthcare provider to call the local or state health department.  Wear a facemask You should wear a facemask that covers your nose and mouth when you are in the same room with other people and when you visit a healthcare provider. People who live with or  visit you should also wear a facemask while they are in the same room with you.  Separate yourself from other people in your home As much as possible, you should stay in a different room from other people in your home. Also, you should use a separate bathroom, if available.  Avoid sharing household items You should not share dishes, drinking glasses, cups, eating utensils, towels, bedding, or other items with other people in your home. After using these items, you should wash them thoroughly with soap and water.  Cover your coughs and sneezes Cover your mouth and nose with a tissue when you cough or sneeze, or you can cough or sneeze into your sleeve. Throw used tissues in a lined trash can, and immediately wash your hands with soap and water for at least 20 seconds or use an alcohol-based hand rub.  Wash your [facility] your hands often and thoroughly with soap and water for at least 20 seconds. You can use an alcohol-based hand sanitizer if soap and water are not available and if your hands are not visibly dirty. Avoid touching your eyes, nose, and mouth with unwashed hands.   Prevention Steps for Caregivers and Household Members of Individuals Confirmed to have, or Being Evaluated for, COVID-19 Infection Being Cared for in the Home  If you live with, or provide care at home for, a person confirmed to have, or being evaluated for, COVID-19 infection please follow these guidelines to prevent infection:  Follow healthcare providers instructions Make sure that you understand and can help the patient follow any healthcare provider instructions for all care.  Provide for the  patients basic needs You should help the patient with basic needs in the home and provide support for getting groceries, prescriptions, and other personal needs.  Monitor the patients symptoms If they are getting sicker, call his or her medical provider and tell them that the patient has, or is being evaluated  for, COVID-19 infection. This will help the healthcare providers office take steps to keep other people from getting infected. Ask the healthcare provider to call the local or state health department.  Limit the number of people who have contact with the patient If possible, have only one caregiver for the patient. Other household members should stay in another home or place of residence. If this is not possible, they should stay in another room, or be separated from the patient as much as possible. Use a separate bathroom, if available. Restrict visitors who do not have an essential need to be in the home.  Keep older adults, very young children, and other sick people away from the patient Keep older adults, very young children, and those who have compromised immune systems or chronic health conditions away from the patient. This includes people with chronic heart, lung, or kidney conditions, diabetes, and cancer.  Ensure good ventilation Make sure that shared spaces in the home have good air flow, such as from an air conditioner or an opened window, weather permitting.  Wash your hands often Wash your hands often and thoroughly with soap and water for at least 20 seconds. You can use an alcohol based hand sanitizer if soap and water are not available and if your hands are not visibly dirty. Avoid touching your eyes, nose, and mouth with unwashed hands. Use disposable paper towels to dry your hands. If not available, use dedicated cloth towels and replace them when they become wet.  Wear a facemask and gloves Wear a disposable facemask at all times in the room and gloves when you touch or have contact with the patients blood, body fluids, and/or secretions or excretions, such as sweat, saliva, sputum, nasal mucus, vomit, urine, or feces.  Ensure the mask fits over your nose and mouth tightly, and do not touch it during use. Throw out disposable facemasks and gloves after using them. Do not  reuse. Wash your hands immediately after removing your facemask and gloves. If your personal clothing becomes contaminated, carefully remove clothing and launder. Wash your hands after handling contaminated clothing. Place all used disposable facemasks, gloves, and other waste in a lined container before disposing them with other household waste. Remove gloves and wash your hands immediately after handling these items.  Do not share dishes, glasses, or other household items with the patient Avoid sharing household items. You should not share dishes, drinking glasses, cups, eating utensils, towels, bedding, or other items with a patient who is confirmed to have, or being evaluated for, COVID-19 infection. After the person uses these items, you should wash them thoroughly with soap and water.  Wash laundry thoroughly Immediately remove and wash clothes or bedding that have blood, body fluids, and/or secretions or excretions, such as sweat, saliva, sputum, nasal mucus, vomit, urine, or feces, on them. Wear gloves when handling laundry from the patient. Read and follow directions on labels of laundry or clothing items and detergent. In general, wash and dry with the warmest temperatures recommended on the label.  Clean all areas the individual has used often Clean all touchable surfaces, such as counters, tabletops, doorknobs, bathroom fixtures, toilets, phones, keyboards, tablets, and bedside tables,  every day. Also, clean any surfaces that may have blood, body fluids, and/or secretions or excretions on them. Wear gloves when cleaning surfaces the patient has come in contact with. Use a diluted bleach solution (e.g., dilute bleach with 1 part bleach and 10 parts water) or a household disinfectant with a label that says EPA-registered for coronaviruses. To make a bleach solution at home, add 1 tablespoon of bleach to 1 quart (4 cups) of water. For a larger supply, add  cup of bleach to 1 gallon (16  cups) of water. Read labels of cleaning products and follow recommendations provided on product labels. Labels contain instructions for safe and effective use of the cleaning product including precautions you should take when applying the product, such as wearing gloves or eye protection and making sure you have good ventilation during use of the product. Remove gloves and wash hands immediately after cleaning.  Monitor yourself for signs and symptoms of illness Caregivers and household members are considered close contacts, should monitor their health, and will be asked to limit movement outside of the home to the extent possible. Follow the monitoring steps for close contacts listed on the symptom monitoring form.   ? If you have additional questions, contact your local health department or call the epidemiologist on call at [phone] (available 24/7). ? This guidance is subject to change. For the most up-to-date guidance from [facility], please refer to their website: [email or web address]

## 2019-03-10 NOTE — ED Notes (Signed)
Pt. Maintained 96-99% Spo2 while ambulating.

## 2019-03-17 ENCOUNTER — Telehealth: Payer: PPO | Admitting: Family Medicine

## 2019-03-17 ENCOUNTER — Telehealth (INDEPENDENT_AMBULATORY_CARE_PROVIDER_SITE_OTHER): Payer: PPO | Admitting: Family Medicine

## 2019-03-17 ENCOUNTER — Other Ambulatory Visit: Payer: Self-pay

## 2019-03-17 NOTE — Progress Notes (Signed)
1101: Called patient at phone number provided [phone]) x [phone]. No answer.  1103: Called number on demographics([phone]) x2 still no answer. 1108: Called [phone], no answer 1109: Called [phone], no answer and left message 1116: Called [phone], no answer 1118: Called [phone], no answer  1125: Called [phone], no answer 1129: Called [phone], no answer

## 2019-03-17 NOTE — Progress Notes (Signed)
1440: Called patient at [phone], no answer, left message 1444: Called patient at [phone], no answer 1449: Called patient at [phone], no answer 1452: Called patient at [phone], no answer

## 2019-03-18 ENCOUNTER — Ambulatory Visit (INDEPENDENT_AMBULATORY_CARE_PROVIDER_SITE_OTHER)
Admission: EM | Admit: 2019-03-18 | Discharge: 2019-03-18 | Disposition: A | Discharge status: Home / Self Care | Payer: PPO | Source: Home / Self Care | Attending: Family Medicine | Admitting: Family Medicine

## 2019-03-18 ENCOUNTER — Encounter (HOSPITAL_COMMUNITY): Payer: Self-pay | Admitting: Emergency Medicine

## 2019-03-18 ENCOUNTER — Emergency Department (HOSPITAL_COMMUNITY): Payer: PPO

## 2019-03-18 ENCOUNTER — Telehealth (INDEPENDENT_AMBULATORY_CARE_PROVIDER_SITE_OTHER): Payer: PPO | Admitting: Family Medicine

## 2019-03-18 ENCOUNTER — Other Ambulatory Visit: Payer: Self-pay

## 2019-03-18 ENCOUNTER — Inpatient Hospital Stay (HOSPITAL_COMMUNITY)
Admission: EM | Admit: 2019-03-18 | Discharge: 2019-03-22 | DRG: 177 | Disposition: A | Discharge status: Home / Self Care | Payer: PPO | Attending: Family Medicine | Admitting: Family Medicine

## 2019-03-18 DIAGNOSIS — L409 Psoriasis, unspecified: Secondary | ICD-10-CM | POA: Diagnosis not present

## 2019-03-18 DIAGNOSIS — L400 Psoriasis vulgaris: Secondary | ICD-10-CM | POA: Diagnosis present

## 2019-03-18 DIAGNOSIS — Z832 Family history of diseases of the blood and blood-forming organs and certain disorders involving the immune mechanism: Secondary | ICD-10-CM | POA: Diagnosis not present

## 2019-03-18 DIAGNOSIS — Z7984 Long term (current) use of oral hypoglycemic drugs: Secondary | ICD-10-CM | POA: Diagnosis not present

## 2019-03-18 DIAGNOSIS — J1282 Pneumonia due to coronavirus disease 2019: Secondary | ICD-10-CM | POA: Diagnosis present

## 2019-03-18 DIAGNOSIS — R05 Cough: Secondary | ICD-10-CM | POA: Diagnosis not present

## 2019-03-18 DIAGNOSIS — U071 COVID-19: Secondary | ICD-10-CM

## 2019-03-18 DIAGNOSIS — I10 Essential (primary) hypertension: Secondary | ICD-10-CM | POA: Diagnosis not present

## 2019-03-18 DIAGNOSIS — Y92239 Unspecified place in hospital as the place of occurrence of the external cause: Secondary | ICD-10-CM | POA: Diagnosis not present

## 2019-03-18 DIAGNOSIS — F1011 Alcohol abuse, in remission: Secondary | ICD-10-CM | POA: Diagnosis present

## 2019-03-18 DIAGNOSIS — Z79899 Other long term (current) drug therapy: Secondary | ICD-10-CM

## 2019-03-18 DIAGNOSIS — Z800 Family history of malignant neoplasm of digestive organs: Secondary | ICD-10-CM

## 2019-03-18 DIAGNOSIS — T380X5A Adverse effect of glucocorticoids and synthetic analogues, initial encounter: Secondary | ICD-10-CM | POA: Diagnosis not present

## 2019-03-18 DIAGNOSIS — J9601 Acute respiratory failure with hypoxia: Secondary | ICD-10-CM | POA: Diagnosis not present

## 2019-03-18 DIAGNOSIS — R040 Epistaxis: Secondary | ICD-10-CM | POA: Diagnosis not present

## 2019-03-18 DIAGNOSIS — Z8719 Personal history of other diseases of the digestive system: Secondary | ICD-10-CM

## 2019-03-18 DIAGNOSIS — Z8249 Family history of ischemic heart disease and other diseases of the circulatory system: Secondary | ICD-10-CM

## 2019-03-18 DIAGNOSIS — F1911 Other psychoactive substance abuse, in remission: Secondary | ICD-10-CM | POA: Diagnosis not present

## 2019-03-18 DIAGNOSIS — E1169 Type 2 diabetes mellitus with other specified complication: Secondary | ICD-10-CM | POA: Diagnosis present

## 2019-03-18 DIAGNOSIS — E1165 Type 2 diabetes mellitus with hyperglycemia: Secondary | ICD-10-CM | POA: Diagnosis not present

## 2019-03-18 DIAGNOSIS — E785 Hyperlipidemia, unspecified: Secondary | ICD-10-CM | POA: Diagnosis not present

## 2019-03-18 DIAGNOSIS — E669 Obesity, unspecified: Secondary | ICD-10-CM | POA: Diagnosis present

## 2019-03-18 DIAGNOSIS — Z8349 Family history of other endocrine, nutritional and metabolic diseases: Secondary | ICD-10-CM

## 2019-03-18 DIAGNOSIS — Z9049 Acquired absence of other specified parts of digestive tract: Secondary | ICD-10-CM | POA: Diagnosis not present

## 2019-03-18 DIAGNOSIS — Z6833 Body mass index (BMI) 33.0-33.9, adult: Secondary | ICD-10-CM | POA: Diagnosis not present

## 2019-03-18 DIAGNOSIS — Z8042 Family history of malignant neoplasm of prostate: Secondary | ICD-10-CM | POA: Diagnosis not present

## 2019-03-18 DIAGNOSIS — I152 Hypertension secondary to endocrine disorders: Secondary | ICD-10-CM | POA: Diagnosis present

## 2019-03-18 DIAGNOSIS — Z87891 Personal history of nicotine dependence: Secondary | ICD-10-CM

## 2019-03-18 DIAGNOSIS — J1289 Other viral pneumonia: Secondary | ICD-10-CM | POA: Diagnosis not present

## 2019-03-18 DIAGNOSIS — G894 Chronic pain syndrome: Secondary | ICD-10-CM | POA: Diagnosis not present

## 2019-03-18 DIAGNOSIS — G8929 Other chronic pain: Secondary | ICD-10-CM | POA: Diagnosis not present

## 2019-03-18 DIAGNOSIS — E1159 Type 2 diabetes mellitus with other circulatory complications: Secondary | ICD-10-CM | POA: Diagnosis present

## 2019-03-18 HISTORY — DX: Pneumonia due to coronavirus disease 2019: J12.82

## 2019-03-18 HISTORY — DX: Acute respiratory failure with hypoxia: J96.01

## 2019-03-18 HISTORY — DX: COVID-19: U07.1

## 2019-03-18 LAB — CBG MONITORING, ED
Glucose-Capillary: 125 mg/dL — ABNORMAL HIGH (ref 70–99)
Glucose-Capillary: 146 mg/dL — ABNORMAL HIGH (ref 70–99)

## 2019-03-18 LAB — CBC
HCT: 44.9 % (ref 39.0–52.0)
HCT: 44.4 % (ref 39.0–52.0)
Hemoglobin: 15.9 g/dL (ref 13.0–17.0)
Hemoglobin: 16.1 g/dL (ref 13.0–17.0)
MCH: 29.5 pg (ref 26.0–34.0)
MCH: 29.8 pg (ref 26.0–34.0)
MCHC: 35.4 g/dL (ref 30.0–36.0)
MCHC: 36.3 g/dL — ABNORMAL HIGH (ref 30.0–36.0)
MCV: 83.3 fL (ref 80.0–100.0)
MCV: 82.2 fL (ref 80.0–100.0)
Platelets: 215 10*3/uL (ref 150–400)
Platelets: 212 10*3/uL (ref 150–400)
RBC: 5.39 MIL/uL (ref 4.22–5.81)
RBC: 5.4 MIL/uL (ref 4.22–5.81)
RDW: 12.8 % (ref 11.5–15.5)
RDW: 12.9 % (ref 11.5–15.5)
WBC: 4.6 10*3/uL (ref 4.0–10.5)
WBC: 4.6 10*3/uL (ref 4.0–10.5)
nRBC: 0 % (ref 0.0–0.2)
nRBC: 0 % (ref 0.0–0.2)

## 2019-03-18 LAB — COMPREHENSIVE METABOLIC PANEL
ALT: 24 U/L (ref 0–44)
AST: 38 U/L (ref 15–41)
Albumin: 3.6 g/dL (ref 3.5–5.0)
Alkaline Phosphatase: 56 U/L (ref 38–126)
Anion gap: 11 (ref 5–15)
BUN: 16 mg/dL (ref 6–20)
CO2: 24 mmol/L (ref 22–32)
Calcium: 8.9 mg/dL (ref 8.9–10.3)
Chloride: 99 mmol/L (ref 98–111)
Creatinine, Ser: 1.08 mg/dL (ref 0.61–1.24)
GFR calc Af Amer: >60 mL/min (ref >60)
GFR calc non Af Amer: >60 mL/min (ref >60)
Glucose, Bld: 141 mg/dL — ABNORMAL HIGH (ref 70–99)
Potassium: 3.8 mmol/L (ref 3.5–5.1)
Sodium: 134 mmol/L — ABNORMAL LOW (ref 135–145)
Total Bilirubin: 0.8 mg/dL (ref 0.3–1.2)
Total Protein: 7.9 g/dL (ref 6.5–8.1)

## 2019-03-18 LAB — PROCALCITONIN: Procalcitonin: <0.1 ng/mL

## 2019-03-18 LAB — LACTIC ACID, PLASMA: Lactic Acid, Venous: 1.5 mmol/L (ref 0.5–1.9)

## 2019-03-18 LAB — D-DIMER, QUANTITATIVE: D-Dimer, Quant: 0.72 ug/mL-FEU — ABNORMAL HIGH (ref 0.00–0.50)

## 2019-03-18 LAB — HEMOGLOBIN A1C
Hgb A1c MFr Bld: 6.6 % — ABNORMAL HIGH (ref 4.8–5.6)
Mean Plasma Glucose: 142.72 mg/dL

## 2019-03-18 LAB — CREATININE, SERUM
Creatinine, Ser: 0.98 mg/dL (ref 0.61–1.24)
GFR calc Af Amer: >60 mL/min (ref >60)
GFR calc non Af Amer: >60 mL/min (ref >60)

## 2019-03-18 LAB — TRIGLYCERIDES: Triglycerides: 115 mg/dL (ref <150)

## 2019-03-18 LAB — C-REACTIVE PROTEIN: CRP: 9 mg/dL — ABNORMAL HIGH (ref <1.0)

## 2019-03-18 LAB — FIBRINOGEN: Fibrinogen: 650 mg/dL — ABNORMAL HIGH (ref 210–475)

## 2019-03-18 LAB — FERRITIN: Ferritin: 311 ng/mL (ref 24–336)

## 2019-03-18 LAB — LACTATE DEHYDROGENASE: LDH: 292 U/L — ABNORMAL HIGH (ref 98–192)

## 2019-03-18 MED ORDER — SODIUM CHLORIDE 0.9 % IV SOLN: 100.0000 mg | Freq: Every day | INTRAVENOUS | Status: DC

## 2019-03-18 MED ORDER — LISINOPRIL 20 MG PO TABS
40.0000 mg | ORAL_TABLET | Freq: Every day | ORAL | Status: DC
  Administered 2019-03-19 – 2019-03-22 (×4): 40 mg via ORAL
  Filled 2019-03-18 (×4): qty 2

## 2019-03-18 MED ORDER — SODIUM CHLORIDE 0.9 % IV SOLN
500.0000 mg | INTRAVENOUS | Status: DC
  Administered 2019-03-18: 500 mg via INTRAVENOUS
  Filled 2019-03-18: qty 500

## 2019-03-18 MED ORDER — ONDANSETRON HCL 4 MG PO TABS: 4.0000 mg | ORAL_TABLET | Freq: Four times a day (QID) | ORAL | Status: DC | PRN

## 2019-03-18 MED ORDER — HYDROCOD POLST-CPM POLST ER 10-8 MG/5ML PO SUER
5.0000 mL | Freq: Two times a day (BID) | ORAL | Status: DC | PRN
  Administered 2019-03-22: 5 mL via ORAL
  Filled 2019-03-18: qty 5

## 2019-03-18 MED ORDER — SODIUM CHLORIDE 0.9 % IV SOLN
INTRAVENOUS | Status: DC
  Administered 2019-03-18 – 2019-03-19 (×2): via INTRAVENOUS

## 2019-03-18 MED ORDER — INSULIN ASPART 100 UNIT/ML ~~LOC~~ SOLN
0.0000 [IU] | Freq: Three times a day (TID) | SUBCUTANEOUS | Status: DC
  Administered 2019-03-19: 3 [IU] via SUBCUTANEOUS
  Administered 2019-03-19 (×2): 4 [IU] via SUBCUTANEOUS
  Administered 2019-03-20 (×2): 7 [IU] via SUBCUTANEOUS
  Administered 2019-03-20 – 2019-03-21 (×2): 4 [IU] via SUBCUTANEOUS
  Administered 2019-03-21: 7 [IU] via SUBCUTANEOUS
  Administered 2019-03-21 – 2019-03-22 (×2): 4 [IU] via SUBCUTANEOUS
  Administered 2019-03-22: 7 [IU] via SUBCUTANEOUS

## 2019-03-18 MED ORDER — ONDANSETRON HCL 4 MG/2ML IJ SOLN
4.0000 mg | Freq: Four times a day (QID) | INTRAMUSCULAR | Status: DC | PRN
  Administered 2019-03-18: 4 mg via INTRAVENOUS
  Filled 2019-03-18: qty 2

## 2019-03-18 MED ORDER — ALBUTEROL SULFATE HFA 108 (90 BASE) MCG/ACT IN AERS
2.0000 | INHALATION_SPRAY | Freq: Four times a day (QID) | RESPIRATORY_TRACT | Status: DC
  Administered 2019-03-18 – 2019-03-22 (×14): 2 via RESPIRATORY_TRACT
  Filled 2019-03-18 (×2): qty 6.7

## 2019-03-18 MED ORDER — ENOXAPARIN SODIUM 40 MG/0.4ML ~~LOC~~ SOLN
40.0000 mg | SUBCUTANEOUS | Status: DC
  Administered 2019-03-18 – 2019-03-21 (×4): 40 mg via SUBCUTANEOUS
  Filled 2019-03-18 (×4): qty 0.4

## 2019-03-18 MED ORDER — DEXAMETHASONE SODIUM PHOSPHATE 10 MG/ML IJ SOLN
6.0000 mg | INTRAMUSCULAR | Status: DC
  Administered 2019-03-18 – 2019-03-21 (×4): 6 mg via INTRAVENOUS
  Filled 2019-03-18 (×4): qty 1

## 2019-03-18 MED ORDER — VITAMIN C 500 MG PO TABS
500.0000 mg | ORAL_TABLET | Freq: Every day | ORAL | Status: DC
  Administered 2019-03-18 – 2019-03-22 (×5): 500 mg via ORAL
  Filled 2019-03-18 (×5): qty 1

## 2019-03-18 MED ORDER — SODIUM CHLORIDE 0.9 % IV SOLN: 200.0000 mg | Freq: Once | INTRAVENOUS | Status: DC

## 2019-03-18 MED ORDER — SODIUM CHLORIDE 0.9 % IV SOLN
100.0000 mg | Freq: Every day | INTRAVENOUS | Status: AC
  Administered 2019-03-19 – 2019-03-22 (×4): 100 mg via INTRAVENOUS
  Filled 2019-03-18 (×7): qty 20

## 2019-03-18 MED ORDER — DICLOFENAC SODIUM 1 % EX GEL
4.0000 g | Freq: Four times a day (QID) | CUTANEOUS | Status: DC
  Administered 2019-03-19 – 2019-03-21 (×11): 4 g via TOPICAL
  Filled 2019-03-18 (×2): qty 100

## 2019-03-18 MED ORDER — SODIUM CHLORIDE 0.9 % IV SOLN
1.0000 g | INTRAVENOUS | Status: DC
  Administered 2019-03-18: 1 g via INTRAVENOUS
  Filled 2019-03-18: qty 10

## 2019-03-18 MED ORDER — FLUTICASONE PROPIONATE 50 MCG/ACT NA SUSP
2.0000 | Freq: Every day | NASAL | Status: DC
  Administered 2019-03-19 – 2019-03-21 (×3): 2 via NASAL
  Filled 2019-03-18 (×2): qty 16

## 2019-03-18 MED ORDER — AMLODIPINE BESYLATE 5 MG PO TABS
5.0000 mg | ORAL_TABLET | Freq: Every day | ORAL | Status: DC
  Administered 2019-03-19 – 2019-03-20 (×2): 5 mg via ORAL
  Filled 2019-03-18 (×2): qty 1

## 2019-03-18 MED ORDER — INSULIN ASPART 100 UNIT/ML ~~LOC~~ SOLN
0.0000 [IU] | Freq: Every day | SUBCUTANEOUS | Status: DC
  Administered 2019-03-20 – 2019-03-21 (×2): 2 [IU] via SUBCUTANEOUS

## 2019-03-18 MED ORDER — ZINC SULFATE 220 (50 ZN) MG PO CAPS
220.0000 mg | ORAL_CAPSULE | Freq: Every day | ORAL | Status: DC
  Administered 2019-03-18 – 2019-03-22 (×5): 220 mg via ORAL
  Filled 2019-03-18 (×5): qty 1

## 2019-03-18 MED ORDER — GUAIFENESIN-DM 100-10 MG/5ML PO SYRP
10.0000 mL | ORAL_SOLUTION | ORAL | Status: DC | PRN
  Administered 2019-03-18 – 2019-03-21 (×3): 10 mL via ORAL
  Filled 2019-03-18 (×3): qty 10

## 2019-03-18 MED ORDER — SODIUM CHLORIDE 0.9 % IV SOLN
200.0000 mg | Freq: Once | INTRAVENOUS | Status: AC
  Administered 2019-03-18: 200 mg via INTRAVENOUS
  Filled 2019-03-18: qty 40

## 2019-03-18 NOTE — ED Notes (Signed)
NRB placed on 10LPM to maintain sats above 95%

## 2019-03-18 NOTE — Progress Notes (Signed)
1445:  Called patient,no answer and unable to leave message as mailbox is full 1450: Called patient, no answer 1452: Called patient,no answer Left two messages yesterday to advise patient that if he felt that symptoms were worsening to seek medical attention at the urgent care or ED.  Unable to leave message today.  If patient calls clinic today again to rebook I would advise that he go to the ED for further evaluation.  Carollee Leitz MD

## 2019-03-18 NOTE — ED Triage Notes (Signed)
PT C/O: persistent cough and SOB.... tested positive for COVID19 on 03/10/2019 when he went to [facility] ED... given a rx for cough but has not picked up  O2 sat in room = 87... has SOB/dyspnea w/exerction  A&O x4... NAD... Ambulatory
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
## 2019-03-18 NOTE — Discharge Instructions (Signed)
GO TO ER °

## 2019-03-18 NOTE — ED Notes (Signed)
When pt stood to get out of wheelchair and into bed he became very SOB, RR40, SPo2 dropped to 80% on RA. Pt placed on NRB at 15L and now pt maintaining sats of 98% on same. [id number]

## 2019-03-18 NOTE — ED Triage Notes (Signed)
Pt endorses SOB and cough for 2-3 days. Tested positive for Covid on 11/30. Pt on O2 on arrival. Endorses abd pain from coughing.

## 2019-03-18 NOTE — ED Notes (Signed)
Transported to ED by Shonna Chock, RN .  Patient transported by wheelchair, o2 at 2 l [place].
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
## 2019-03-18 NOTE — ED Notes (Signed)
Patient is being discharged from the Urgent [facility] and sent to the Emergency Department via wheelchair by staff. Per dr Meda Coffee, patient is stable but in need of higher level of care due to hypotensive, tachycardic, hypoxic, tachypnea.  covid positive. Patient is aware and verbalizes understanding of plan of care.  Vitals:   03/18/19 1537 03/18/19 1539  BP: 121/77   Pulse: 95   Resp: 18   Temp: 99.7 F (37.6 C)   SpO2:  (!) 87%

## 2019-03-19 ENCOUNTER — Encounter (HOSPITAL_COMMUNITY): Payer: Self-pay

## 2019-03-19 DIAGNOSIS — Z8042 Family history of malignant neoplasm of prostate: Secondary | ICD-10-CM | POA: Diagnosis not present

## 2019-03-19 DIAGNOSIS — F1011 Alcohol abuse, in remission: Secondary | ICD-10-CM

## 2019-03-19 DIAGNOSIS — E1165 Type 2 diabetes mellitus with hyperglycemia: Secondary | ICD-10-CM | POA: Diagnosis not present

## 2019-03-19 DIAGNOSIS — G8929 Other chronic pain: Secondary | ICD-10-CM | POA: Diagnosis not present

## 2019-03-19 DIAGNOSIS — E669 Obesity, unspecified: Secondary | ICD-10-CM | POA: Diagnosis not present

## 2019-03-19 DIAGNOSIS — E785 Hyperlipidemia, unspecified: Secondary | ICD-10-CM

## 2019-03-19 DIAGNOSIS — I10 Essential (primary) hypertension: Secondary | ICD-10-CM

## 2019-03-19 DIAGNOSIS — Z7984 Long term (current) use of oral hypoglycemic drugs: Secondary | ICD-10-CM | POA: Diagnosis not present

## 2019-03-19 DIAGNOSIS — G894 Chronic pain syndrome: Secondary | ICD-10-CM

## 2019-03-19 DIAGNOSIS — Z8349 Family history of other endocrine, nutritional and metabolic diseases: Secondary | ICD-10-CM | POA: Diagnosis not present

## 2019-03-19 DIAGNOSIS — Z87891 Personal history of nicotine dependence: Secondary | ICD-10-CM | POA: Diagnosis not present

## 2019-03-19 DIAGNOSIS — U071 COVID-19: Principal | ICD-10-CM

## 2019-03-19 DIAGNOSIS — T380X5A Adverse effect of glucocorticoids and synthetic analogues, initial encounter: Secondary | ICD-10-CM | POA: Diagnosis not present

## 2019-03-19 DIAGNOSIS — F1911 Other psychoactive substance abuse, in remission: Secondary | ICD-10-CM

## 2019-03-19 DIAGNOSIS — Z8719 Personal history of other diseases of the digestive system: Secondary | ICD-10-CM | POA: Diagnosis not present

## 2019-03-19 DIAGNOSIS — J1289 Other viral pneumonia: Secondary | ICD-10-CM | POA: Diagnosis not present

## 2019-03-19 DIAGNOSIS — L400 Psoriasis vulgaris: Secondary | ICD-10-CM | POA: Diagnosis not present

## 2019-03-19 DIAGNOSIS — Z6833 Body mass index (BMI) 33.0-33.9, adult: Secondary | ICD-10-CM | POA: Diagnosis not present

## 2019-03-19 DIAGNOSIS — J9601 Acute respiratory failure with hypoxia: Secondary | ICD-10-CM | POA: Diagnosis not present

## 2019-03-19 DIAGNOSIS — Z79899 Other long term (current) drug therapy: Secondary | ICD-10-CM | POA: Diagnosis not present

## 2019-03-19 DIAGNOSIS — Z9049 Acquired absence of other specified parts of digestive tract: Secondary | ICD-10-CM | POA: Diagnosis not present

## 2019-03-19 DIAGNOSIS — Z8249 Family history of ischemic heart disease and other diseases of the circulatory system: Secondary | ICD-10-CM | POA: Diagnosis not present

## 2019-03-19 DIAGNOSIS — R040 Epistaxis: Secondary | ICD-10-CM | POA: Diagnosis not present

## 2019-03-19 DIAGNOSIS — Z832 Family history of diseases of the blood and blood-forming organs and certain disorders involving the immune mechanism: Secondary | ICD-10-CM | POA: Diagnosis not present

## 2019-03-19 DIAGNOSIS — Z800 Family history of malignant neoplasm of digestive organs: Secondary | ICD-10-CM | POA: Diagnosis not present

## 2019-03-19 DIAGNOSIS — L409 Psoriasis, unspecified: Secondary | ICD-10-CM

## 2019-03-19 LAB — CBC WITH DIFFERENTIAL/PLATELET
Abs Immature Granulocytes: 0.01 10*3/uL (ref 0.00–0.07)
Basophils Absolute: 0 10*3/uL (ref 0.0–0.1)
Basophils Relative: 0 %
Eosinophils Absolute: 0 10*3/uL (ref 0.0–0.5)
Eosinophils Relative: 0 %
HCT: 40 % (ref 39.0–52.0)
Hemoglobin: 14.7 g/dL (ref 13.0–17.0)
Immature Granulocytes: 0 %
Lymphocytes Relative: 27 %
Lymphs Abs: 0.7 10*3/uL (ref 0.7–4.0)
MCH: 30.1 pg (ref 26.0–34.0)
MCHC: 36.8 g/dL — ABNORMAL HIGH (ref 30.0–36.0)
MCV: 81.8 fL (ref 80.0–100.0)
Monocytes Absolute: 0.2 10*3/uL (ref 0.1–1.0)
Monocytes Relative: 8 %
Neutro Abs: 1.8 10*3/uL (ref 1.7–7.7)
Neutrophils Relative %: 65 %
Platelets: 217 10*3/uL (ref 150–400)
RBC: 4.89 MIL/uL (ref 4.22–5.81)
RDW: 12.7 % (ref 11.5–15.5)
WBC: 2.8 10*3/uL — ABNORMAL LOW (ref 4.0–10.5)
nRBC: 0 % (ref 0.0–0.2)

## 2019-03-19 LAB — COMPREHENSIVE METABOLIC PANEL
ALT: 22 U/L (ref 0–44)
AST: 35 U/L (ref 15–41)
Albumin: 3 g/dL — ABNORMAL LOW (ref 3.5–5.0)
Alkaline Phosphatase: 46 U/L (ref 38–126)
Anion gap: 10 (ref 5–15)
BUN: 12 mg/dL (ref 6–20)
CO2: 21 mmol/L — ABNORMAL LOW (ref 22–32)
Calcium: 8.4 mg/dL — ABNORMAL LOW (ref 8.9–10.3)
Chloride: 105 mmol/L (ref 98–111)
Creatinine, Ser: 0.73 mg/dL (ref 0.61–1.24)
GFR calc Af Amer: >60 mL/min (ref >60)
GFR calc non Af Amer: >60 mL/min (ref >60)
Glucose, Bld: 176 mg/dL — ABNORMAL HIGH (ref 70–99)
Potassium: 4.2 mmol/L (ref 3.5–5.1)
Sodium: 136 mmol/L (ref 135–145)
Total Bilirubin: 1 mg/dL (ref 0.3–1.2)
Total Protein: 7 g/dL (ref 6.5–8.1)

## 2019-03-19 LAB — SAMPLE TO BLOOD BANK

## 2019-03-19 LAB — GLUCOSE, CAPILLARY
Glucose-Capillary: 149 mg/dL — ABNORMAL HIGH (ref 70–99)
Glucose-Capillary: 193 mg/dL — ABNORMAL HIGH (ref 70–99)
Glucose-Capillary: 169 mg/dL — ABNORMAL HIGH (ref 70–99)
Glucose-Capillary: 147 mg/dL — ABNORMAL HIGH (ref 70–99)

## 2019-03-19 LAB — HIV ANTIBODY (ROUTINE TESTING W REFLEX): HIV Screen 4th Generation wRfx: NONREACTIVE

## 2019-03-19 LAB — C-REACTIVE PROTEIN: CRP: 9.1 mg/dL — ABNORMAL HIGH (ref <1.0)

## 2019-03-19 LAB — PHOSPHORUS: Phosphorus: 2.1 mg/dL — ABNORMAL LOW (ref 2.5–4.6)

## 2019-03-19 LAB — FERRITIN: Ferritin: 307 ng/mL (ref 24–336)

## 2019-03-19 LAB — MAGNESIUM: Magnesium: 1.9 mg/dL (ref 1.7–2.4)

## 2019-03-19 LAB — D-DIMER, QUANTITATIVE: D-Dimer, Quant: 0.71 ug/mL-FEU — ABNORMAL HIGH (ref 0.00–0.50)

## 2019-03-19 MED ORDER — CLOBETASOL PROPIONATE 0.05 % EX OINT
TOPICAL_OINTMENT | Freq: Two times a day (BID) | CUTANEOUS | Status: DC
  Administered 2019-03-19 – 2019-03-21 (×5): via TOPICAL
  Filled 2019-03-19: qty 15

## 2019-03-19 MED ORDER — ACETAMINOPHEN 325 MG PO TABS
650.0000 mg | ORAL_TABLET | Freq: Four times a day (QID) | ORAL | Status: DC | PRN
  Administered 2019-03-19: 650 mg via ORAL
  Filled 2019-03-19: qty 2

## 2019-03-19 NOTE — Plan of Care (Signed)
  Problem: Education: Goal: Knowledge of risk factors and measures for prevention of condition will improve Outcome: Progressing   Problem: Coping: Goal: Psychosocial and spiritual needs will be supported Outcome: Progressing   

## 2019-03-19 NOTE — Progress Notes (Signed)
PROGRESS NOTE  Cameron Diaz  [id number] DOB: [birth year] DOA: 03/18/2019 PCP: [name], DO   Brief Narrative: Grandson's mother exposed him, felt myalgias diffusely 11/29, Dx covid without hypoxia 11/30, isolating but worsened SOB so presented to UC where he was hypoxic, transferred to ED where CXR showed bilateral pulmonary infiltrates and he was tachypneic, hypoxic requiring NRB initially, weaned to nasal cannula. CRP 9, PCT negative. Remdesivir and steroids started, admitted to [facility] 12/8.   Assessment & Plan: Principal Problem:   Acute respiratory failure with hypoxia (HCC) Active Problems:   Essential hypertension   Psoriasis   Chronic pain   H/O alcohol abuse   H/O drug abuse ([place])   Hyperlipidemia   Pneumonia due to COVID-19 virus  Acute hypoxic respiratory failure due to covid-19 pneumonia: Now on 13L O2 but with no increased respiratory effort, SpO2 is stable.  - Continue remdesivir 12/8 - 12/12 - Continue steroids. CRP stable. - Consider tocilizumab if hypoxia advances. He's around day 10-11 of illness. PCT negative. Can stop abx. - Continue airborne, contact precautions. PPE including surgical gown, gloves, cap, shoe covers, and CAPR used during this encounter in a negative pressure room.  - Check daily labs: CBC w/diff, CMP, d-dimer, ferritin, CRP - Enoxaparin prophylactic dose. - Maintain euvolemia/net negative. Stop IVF, he's eating/drinking. - Avoid NSAIDs - Recommend proning and aggressive use of incentive spirometry. RN to encourage patient.  HTN:  - Continue lisinopril, norvasc  T2DM with steroid-induced hyperglycemia: Well-controlled with HbA1c 6.6%. - SSI while admitted  Plaque psoriasis:  - Clobetasol topically  Obesity: BMI 32. Noted  DVT prophylaxis: Lovenox Code Status: Full Family Communication: None at bedside, did not request a call. Disposition Plan: Uncertain, pending clinical trajectory  Consultants:   None  Procedures:    None  Antimicrobials:  Remdesivir   Subjective: Shortness of breath is about the same, some cough with associated soreness in the chest, not exertional.  Objective: Vitals:   03/19/19 0333 03/19/19 0340 03/19/19 0805 03/19/19 1653  BP: 133/85  124/82 128/81  Pulse:   92 89  Resp:   20 20  Temp: 98.8 F (37.1 C)  98.3 F (36.8 C) 98.3 F (36.8 C)  TempSrc: Oral  Oral Oral  SpO2:   94% 94%  Weight:  105.2 kg    Height:  5\' 11"  (1.803 m)      Intake/Output Summary (Last 24 hours) at 03/19/2019 1827 Last data filed at 03/19/2019 1500 Gross per 24 hour  Intake 2872.22 ml  Output 1250 ml  Net 1622.22 ml   Filed Weights   03/18/19 1638 03/19/19 0340  Weight: 108.9 kg 105.2 kg    Gen: 58 y.o. male in no distress  Pulm: Non-labored breathing supplemental oxygen. Diminished with crackles bilaterally.  CV: Regular rate and rhythm. No murmur, rub, or gallop. No JVD, no significant pedal edema. GI: Abdomen soft, non-tender, non-distended, with normoactive bowel sounds. No organomegaly or masses felt. Ext: Warm, no deformities Skin: No rashes, lesions or ulcers on visualized skin Neuro: Alert and oriented. No focal neurological deficits. Psych: Judgement and insight appear normal. Mood & affect appropriate.   Data Reviewed: I have personally reviewed following labs and imaging studies  CBC: Recent Labs  Lab 03/18/19 1648 03/18/19 2235 03/19/19 0830  WBC 4.6 4.6 2.8*  NEUTROABS  --   --  1.8  HGB 15.9 16.1 14.7  HCT 44.9 44.4 40.0  MCV 83.3 82.2 81.8  PLT 215 212 [id number]   Basic Metabolic  Panel: Recent Labs  Lab 03/18/19 1648 03/18/19 2235 03/19/19 0830  NA 134*  --  136  K 3.8  --  4.2  CL 99  --  105  CO2 24  --  21*  GLUCOSE 141*  --  176*  BUN 16  --  12  CREATININE 1.08 0.98 0.73  CALCIUM 8.9  --  8.4*  MG  --   --  1.9  PHOS  --   --  2.1*   GFR: Estimated Creatinine Clearance: 124.3 mL/min (by C-G formula based on SCr of 0.73 mg/dL). Liver Function  Tests: Recent Labs  Lab 03/18/19 1648 03/19/19 0830  AST 38 35  ALT 24 22  ALKPHOS 56 46  BILITOT 0.8 1.0  PROT 7.9 7.0  ALBUMIN 3.6 3.0*   No results for input(s): LIPASE, AMYLASE in the last 168 hours. No results for input(s): AMMONIA in the last 168 hours. Coagulation Profile: No results for input(s): INR, PROTIME in the last 168 hours. Cardiac Enzymes: No results for input(s): CKTOTAL, CKMB, CKMBINDEX, TROPONINI in the last 168 hours. BNP (last 3 results) No results for input(s): PROBNP in the last 8760 hours. HbA1C: Recent Labs    03/18/19 2235  HGBA1C 6.6*   CBG: Recent Labs  Lab 03/18/19 1645 03/18/19 2234 03/19/19 0817 03/19/19 1136 03/19/19 1652  GLUCAP 125* 146* 149* 193* 169*   Lipid Profile: Recent Labs    03/18/19 1934  TRIG 115   Thyroid Function Tests: No results for input(s): TSH, T4TOTAL, FREET4, T3FREE, THYROIDAB in the last 72 hours. Anemia Panel: Recent Labs    03/18/19 1934 03/19/19 0830  FERRITIN 311 307   Urine analysis:    Component Value Date/Time   COLORURINE YELLOW 10/14/2017 1537   APPEARANCEUR CLEAR 10/14/2017 1537   LABSPEC 1.011 10/14/2017 1537   PHURINE 7.0 10/14/2017 1537   GLUCOSEU NEGATIVE 10/14/2017 1537   HGBUR NEGATIVE 10/14/2017 1537   BILIRUBINUR NEGATIVE 10/14/2017 1537   KETONESUR NEGATIVE 10/14/2017 1537   PROTEINUR NEGATIVE 10/14/2017 1537   UROBILINOGEN 1.0 07/22/2014 2336   NITRITE NEGATIVE 10/14/2017 1537   LEUKOCYTESUR NEGATIVE 10/14/2017 1537   Recent Results (from the past 240 hour(s))  Blood Culture (routine x 2)     Status: None (Preliminary result)   Collection Time: 03/18/19  7:13 PM   Specimen: BLOOD  Result Value Ref Range Status   Specimen Description BLOOD LEFT ANTECUBITAL  Final   Special Requests   Final    BOTTLES DRAWN AEROBIC AND ANAEROBIC Blood Culture adequate volume   Culture   Final    NO GROWTH < 24 HOURS Performed at [facility], [place] [address], [place], [place]  [address]    Report Status PENDING  Incomplete  Blood Culture (routine x 2)     Status: None (Preliminary result)   Collection Time: 03/18/19  7:35 PM   Specimen: BLOOD  Result Value Ref Range Status   Specimen Description BLOOD RIGHT ANTECUBITAL  Final   Special Requests   Final    BOTTLES DRAWN AEROBIC AND ANAEROBIC Blood Culture adequate volume   Culture   Final    NO GROWTH < 24 HOURS Performed at [facility], [place] [address], [place], [place] [address]    Report Status PENDING  Incomplete      Radiology Studies: Dg Chest Port 1 View  Result Date: 03/18/2019 CLINICAL DATA:  COVID-19 diagnosed on 03/10/2019, shortness of breath and cough for 2-3 days, hypoxia, diabetes mellitus,  hypertension EXAM: PORTABLE CHEST 1 VIEW COMPARISON:  Portable exam 1649 hours compared to 03/10/2019 FINDINGS: Upper normal heart size. Mediastinal contours and pulmonary vascularity normal. Patchy airspace infiltrates in the mid to lower lungs bilaterally consistent with multifocal pneumonia and history of COVID-19. Subsegmental atelectasis RIGHT base. No pleural effusion or pneumothorax. IMPRESSION: BILATERAL pulmonary infiltrates consistent with COVID-19 pneumonia. Electronically Signed   By: Lavonia Dana M.D.   On: 03/18/2019 17:05    Scheduled Meds: . albuterol  2 puff Inhalation Q6H  . amLODipine  5 mg Oral Daily  . clobetasol ointment   Topical BID  . dexamethasone (DECADRON) injection  6 mg Intravenous Q24H  . diclofenac Sodium  4 g Topical QID  . enoxaparin (LOVENOX) injection  40 mg Subcutaneous Q24H  . fluticasone  2 spray Each Nare Daily  . insulin aspart  0-20 Units Subcutaneous TID WC  . insulin aspart  0-5 Units Subcutaneous QHS  . lisinopril  40 mg Oral Daily  . vitamin C  500 mg Oral Daily  . zinc sulfate  220 mg Oral Daily   Continuous Infusions: . remdesivir 100 mg in NS 100 mL 100 mg (03/19/19 0903)     LOS: 1 day   Time spent: 25 minutes.  Patrecia Pour, MD [facility] [email or web address] 03/19/2019, 6:27 PM

## 2019-03-19 NOTE — Progress Notes (Signed)
Offered to call family to give update, pt replied he has been in contact with them updating them. Denied need to call

## 2019-03-19 NOTE — Progress Notes (Signed)
   03/19/19 1400  Family/Significant Other Communication  Family/Significant Other Update Called (Called wife, No answer)

## 2019-03-20 DIAGNOSIS — R040 Epistaxis: Secondary | ICD-10-CM

## 2019-03-20 LAB — COMPREHENSIVE METABOLIC PANEL
ALT: 22 U/L (ref 0–44)
AST: 37 U/L (ref 15–41)
Albumin: 3.1 g/dL — ABNORMAL LOW (ref 3.5–5.0)
Alkaline Phosphatase: 49 U/L (ref 38–126)
Anion gap: 11 (ref 5–15)
BUN: 15 mg/dL (ref 6–20)
CO2: 21 mmol/L — ABNORMAL LOW (ref 22–32)
Calcium: 9 mg/dL (ref 8.9–10.3)
Chloride: 105 mmol/L (ref 98–111)
Creatinine, Ser: 0.7 mg/dL (ref 0.61–1.24)
GFR calc Af Amer: >60 mL/min (ref >60)
GFR calc non Af Amer: >60 mL/min (ref >60)
Glucose, Bld: 198 mg/dL — ABNORMAL HIGH (ref 70–99)
Potassium: 4.1 mmol/L (ref 3.5–5.1)
Sodium: 137 mmol/L (ref 135–145)
Total Bilirubin: 0.9 mg/dL (ref 0.3–1.2)
Total Protein: 7.4 g/dL (ref 6.5–8.1)

## 2019-03-20 LAB — GLUCOSE, CAPILLARY
Glucose-Capillary: 222 mg/dL — ABNORMAL HIGH (ref 70–99)
Glucose-Capillary: 220 mg/dL — ABNORMAL HIGH (ref 70–99)
Glucose-Capillary: 164 mg/dL — ABNORMAL HIGH (ref 70–99)
Glucose-Capillary: 201 mg/dL — ABNORMAL HIGH (ref 70–99)

## 2019-03-20 LAB — CBC WITH DIFFERENTIAL/PLATELET
Abs Immature Granulocytes: 0.02 10*3/uL (ref 0.00–0.07)
Basophils Absolute: 0 10*3/uL (ref 0.0–0.1)
Basophils Relative: 0 %
Eosinophils Absolute: 0 10*3/uL (ref 0.0–0.5)
Eosinophils Relative: 0 %
HCT: 42.9 % (ref 39.0–52.0)
Hemoglobin: 15.4 g/dL (ref 13.0–17.0)
Immature Granulocytes: 0 %
Lymphocytes Relative: 15 %
Lymphs Abs: 0.9 10*3/uL (ref 0.7–4.0)
MCH: 29.3 pg (ref 26.0–34.0)
MCHC: 35.9 g/dL (ref 30.0–36.0)
MCV: 81.6 fL (ref 80.0–100.0)
Monocytes Absolute: 0.4 10*3/uL (ref 0.1–1.0)
Monocytes Relative: 7 %
Neutro Abs: 4.5 10*3/uL (ref 1.7–7.7)
Neutrophils Relative %: 78 %
Platelets: 282 10*3/uL (ref 150–400)
RBC: 5.26 MIL/uL (ref 4.22–5.81)
RDW: 12.6 % (ref 11.5–15.5)
WBC: 5.8 10*3/uL (ref 4.0–10.5)
nRBC: 0 % (ref 0.0–0.2)

## 2019-03-20 LAB — FERRITIN: Ferritin: 291 ng/mL (ref 24–336)

## 2019-03-20 LAB — D-DIMER, QUANTITATIVE: D-Dimer, Quant: 1.06 ug/mL-FEU — ABNORMAL HIGH (ref 0.00–0.50)

## 2019-03-20 LAB — PHOSPHORUS: Phosphorus: 1.8 mg/dL — ABNORMAL LOW (ref 2.5–4.6)

## 2019-03-20 LAB — MAGNESIUM: Magnesium: 2 mg/dL (ref 1.7–2.4)

## 2019-03-20 LAB — C-REACTIVE PROTEIN: CRP: 7.3 mg/dL — ABNORMAL HIGH (ref <1.0)

## 2019-03-20 MED ORDER — SODIUM PHOSPHATES 45 MMOLE/15ML IV SOLN
30.0000 mmol | Freq: Once | INTRAVENOUS | Status: DC
  Filled 2019-03-20: qty 10

## 2019-03-20 MED ORDER — SODIUM PHOSPHATES 45 MMOLE/15ML IV SOLN
30.0000 mmol | Freq: Once | INTRAVENOUS | Status: AC
  Administered 2019-03-20: 30 mmol via INTRAVENOUS
  Filled 2019-03-20: qty 10

## 2019-03-20 MED ORDER — HYDRALAZINE HCL 25 MG PO TABS: 25.0000 mg | ORAL_TABLET | Freq: Four times a day (QID) | ORAL | Status: DC | PRN

## 2019-03-20 MED ORDER — AMLODIPINE BESYLATE 5 MG PO TABS: 5.0000 mg | ORAL_TABLET | Freq: Once | ORAL | Status: DC

## 2019-03-20 MED ORDER — AMLODIPINE BESYLATE 10 MG PO TABS: 10.0000 mg | ORAL_TABLET | Freq: Every day | ORAL | Status: DC

## 2019-03-20 MED ORDER — LINAGLIPTIN 5 MG PO TABS
5.0000 mg | ORAL_TABLET | Freq: Every day | ORAL | Status: DC
  Administered 2019-03-20 – 2019-03-22 (×3): 5 mg via ORAL
  Filled 2019-03-20 (×3): qty 1

## 2019-03-20 MED ORDER — AMLODIPINE BESYLATE 10 MG PO TABS
10.0000 mg | ORAL_TABLET | Freq: Every day | ORAL | Status: DC
  Administered 2019-03-21 – 2019-03-22 (×2): 10 mg via ORAL
  Filled 2019-03-20 (×2): qty 1

## 2019-03-20 MED ORDER — SALINE SPRAY 0.65 % NA SOLN
1.0000 | NASAL | Status: DC | PRN
  Filled 2019-03-20: qty 44

## 2019-03-20 NOTE — Plan of Care (Signed)
Pt sitting on side of bed. Pt's O2 decreased to 1.5L from 13L HF. Tolerating well, O2 sats between 94-98%, does get SOB with exertion and desats to 88-89%. Encouraged to call for assistance before getting up. Safety precautions in place. Report to be given to day shift nurse Problem: Education: Goal: Knowledge of risk factors and measures for prevention of condition will improve Outcome: Progressing   Problem: Coping: Goal: Psychosocial and spiritual needs will be supported Outcome: Progressing   Problem: Respiratory: Goal: Will maintain a patent airway Outcome: Progressing Goal: Complications related to the disease process, condition or treatment will be avoided or minimized Outcome: Progressing

## 2019-03-20 NOTE — Progress Notes (Signed)
No answer Wife

## 2019-03-20 NOTE — Progress Notes (Signed)
PROGRESS NOTE  Cameron Diaz  [id number] DOB: 12/12/1960 DOA: 03/18/2019 PCP: [name], DO   Brief Narrative: Grandson's mother exposed him, felt myalgias diffusely 11/29, Dx covid without hypoxia 11/30, isolating but worsened SOB so presented to UC where he was hypoxic, transferred to ED where CXR showed bilateral pulmonary infiltrates and he was tachypneic, hypoxic requiring NRB initially, weaned to nasal cannula. CRP 9, PCT negative. Remdesivir and steroids started, admitted to [facility] 12/8.   Assessment & Plan: Principal Problem:   Acute respiratory failure with hypoxia (HCC) Active Problems:   Essential hypertension   Psoriasis   Chronic pain   H/O alcohol abuse   H/O drug abuse ([place])   Hyperlipidemia   Pneumonia due to COVID-19 virus  Acute hypoxic respiratory failure due to covid-19 pneumonia: Now on 13L O2 but with no increased respiratory effort, SpO2 is improving, seems to be prone-responsive. - Continue remdesivir 12/8 - 12/12 - Continue steroids. CRP improved. - No indication of need for other therapies. - Continue airborne, contact precautions. PPE including surgical gown, gloves, cap, shoe covers, and CAPR used during this encounter in a negative pressure room.  - Check daily labs: CBC w/diff, CMP, d-dimer, ferritin, CRP - Enoxaparin prophylactic dose. - Maintain euvolemia/net negative. Stop IVF, he's eating/drinking. - Avoid NSAIDs - Recommend proning and aggressive use of incentive spirometry. RN to encourage patient.  HTN: Uncontrolled - Increase norvasc dose, continue lisinopril. Add hydralazine prn.   T2DM with steroid-induced hyperglycemia: Well-controlled with HbA1c 6.6%. - SSI while admitted. Will add linagliptin as CBGs are slightly above goal  Plaque psoriasis:  - Clobetasol topically, continue.  Obesity: BMI 32. Noted  Epistaxis: Self-limited. Hgb stable/normal.  - Continue flonase, add ocean nasal spray. Anticipate improvement with  decreased oxygen flow rate. Feel risk of DVT outweighs risk of uncontrolled bleeding at this time, continue lovenox.  DVT prophylaxis: Lovenox Code Status: Full Family Communication: None at bedside, declined offer to call Disposition Plan: Uncertain, pending clinical trajectory, hopeful for DC after 5th dose of remdesivir if hypoxia continues to improve.  Consultants:   None  Procedures:   None  Antimicrobials:  Remdesivir   Subjective: Feels shortness of breath at rest is much better, remains severe with exertion, however. Some cough, no current chest pain. Epistaxis last night, abated spontaneously, only small amount per pt. No other bleeding/bruising.   Objective: Vitals:   03/20/19 0000 03/20/19 0431 03/20/19 0556 03/20/19 0800  BP: 121/77 129/85  (!) 142/99  Pulse: 85 81  99  Resp: (!) 25 (!) 26 19 (!) 22  Temp: 97.8 F (36.6 C) 98.1 F (36.7 C)  98.4 F (36.9 C)  TempSrc: Oral Other (Comment)  Oral  SpO2: 100% 100% 97% 94%  Weight:      Height:        Intake/Output Summary (Last 24 hours) at 03/20/2019 1145 Last data filed at 03/20/2019 0800 Gross per 24 hour  Intake 1430 ml  Output 1050 ml  Net 380 ml   Filed Weights   03/18/19 1638 03/19/19 0340  Weight: 108.9 kg 105.2 kg   Gen: 58 y.o. male in no distress, prone  HEENT: No active bleeding from nares Pulm: Nonlabored breathing 1.5L O2 at rest. Diminished, no crackles today. CV: Regular rate and rhythm. No murmur, rub, or gallop. No JVD, no dependent edema. GI: Abdomen soft, non-tender, non-distended, with normoactive bowel sounds.  Ext: Warm, no deformities Skin: No new rashes, lesions or ulcers on visualized skin. Left forearm plaque is  stable, ointment in place. Neuro: Alert and oriented. No focal neurological deficits. Psych: Judgement and insight appear fair. Mood euthymic & affect congruent. Behavior is appropriate.    Data Reviewed: I have personally reviewed following labs and imaging studies   CBC: Recent Labs  Lab 03/18/19 1648 03/18/19 2235 03/19/19 0830 03/20/19 0120  WBC 4.6 4.6 2.8* 5.8  NEUTROABS  --   --  1.8 4.5  HGB 15.9 16.1 14.7 15.4  HCT 44.9 44.4 40.0 42.9  MCV 83.3 82.2 81.8 81.6  PLT 215 212 217 [id number]   Basic Metabolic Panel: Recent Labs  Lab 03/18/19 1648 03/18/19 2235 03/19/19 0830 03/20/19 0120  NA 134*  --  136 137  K 3.8  --  4.2 4.1  CL 99  --  105 105  CO2 24  --  21* 21*  GLUCOSE 141*  --  176* 198*  BUN 16  --  12 15  CREATININE 1.08 0.98 0.73 0.70  CALCIUM 8.9  --  8.4* 9.0  MG  --   --  1.9 2.0  PHOS  --   --  2.1* 1.8*   GFR: Estimated Creatinine Clearance: 124.3 mL/min (by C-G formula based on SCr of 0.7 mg/dL). Liver Function Tests: Recent Labs  Lab 03/18/19 1648 03/19/19 0830 03/20/19 0120  AST 38 35 37  ALT 24 22 22   ALKPHOS 56 46 49  BILITOT 0.8 1.0 0.9  PROT 7.9 7.0 7.4  ALBUMIN 3.6 3.0* 3.1*   No results for input(s): LIPASE, AMYLASE in the last 168 hours. No results for input(s): AMMONIA in the last 168 hours. Coagulation Profile: No results for input(s): INR, PROTIME in the last 168 hours. Cardiac Enzymes: No results for input(s): CKTOTAL, CKMB, CKMBINDEX, TROPONINI in the last 168 hours. BNP (last 3 results) No results for input(s): PROBNP in the last 8760 hours. HbA1C: Recent Labs    03/18/19 2235  HGBA1C 6.6*   CBG: Recent Labs  Lab 03/19/19 0817 03/19/19 1136 03/19/19 1652 03/19/19 2104 03/20/19 0846  GLUCAP 149* 193* 169* 147* 222*   Lipid Profile: Recent Labs    03/18/19 1934  TRIG 115   Thyroid Function Tests: No results for input(s): TSH, T4TOTAL, FREET4, T3FREE, THYROIDAB in the last 72 hours. Anemia Panel: Recent Labs    03/19/19 0830 03/20/19 0120  FERRITIN 307 291   Urine analysis:    Component Value Date/Time   COLORURINE YELLOW 10/14/2017 1537   APPEARANCEUR CLEAR 10/14/2017 1537   LABSPEC 1.011 10/14/2017 1537   PHURINE 7.0 10/14/2017 1537   GLUCOSEU NEGATIVE  10/14/2017 1537   HGBUR NEGATIVE 10/14/2017 1537   BILIRUBINUR NEGATIVE 10/14/2017 1537   KETONESUR NEGATIVE 10/14/2017 1537   PROTEINUR NEGATIVE 10/14/2017 1537   UROBILINOGEN 1.0 07/22/2014 2336   NITRITE NEGATIVE 10/14/2017 1537   LEUKOCYTESUR NEGATIVE 10/14/2017 1537   Recent Results (from the past 240 hour(s))  Blood Culture (routine x 2)     Status: None (Preliminary result)   Collection Time: 03/18/19  7:13 PM   Specimen: BLOOD  Result Value Ref Range Status   Specimen Description BLOOD LEFT ANTECUBITAL  Final   Special Requests   Final    BOTTLES DRAWN AEROBIC AND ANAEROBIC Blood Culture adequate volume   Culture   Final    NO GROWTH 2 DAYS Performed at [facility], [place] [address], [place], [place] [address]    Report Status PENDING  Incomplete  Blood Culture (routine x 2)     Status: None (  Preliminary result)   Collection Time: 03/18/19  7:35 PM   Specimen: BLOOD  Result Value Ref Range Status   Specimen Description BLOOD RIGHT ANTECUBITAL  Final   Special Requests   Final    BOTTLES DRAWN AEROBIC AND ANAEROBIC Blood Culture adequate volume   Culture   Final    NO GROWTH 2 DAYS Performed at [facility], [address] [place], [place] [address]    Report Status PENDING  Incomplete      Radiology Studies: DG Chest Port 1 View  Result Date: 03/18/2019 CLINICAL DATA:  COVID-19 diagnosed on 03/10/2019, shortness of breath and cough for 2-3 days, hypoxia, diabetes mellitus, hypertension EXAM: PORTABLE CHEST 1 VIEW COMPARISON:  Portable exam 1649 hours compared to 03/10/2019 FINDINGS: Upper normal heart size. Mediastinal contours and pulmonary vascularity normal. Patchy airspace infiltrates in the mid to lower lungs bilaterally consistent with multifocal pneumonia and history of COVID-19. Subsegmental atelectasis RIGHT base. No pleural effusion or pneumothorax. IMPRESSION: BILATERAL pulmonary infiltrates consistent with COVID-19 pneumonia. Electronically  Signed   By: Lavonia Dana M.D.   On: 03/18/2019 17:05    Scheduled Meds: . albuterol  2 puff Inhalation Q6H  . amLODipine  5 mg Oral Daily  . clobetasol ointment   Topical BID  . dexamethasone (DECADRON) injection  6 mg Intravenous Q24H  . diclofenac Sodium  4 g Topical QID  . enoxaparin (LOVENOX) injection  40 mg Subcutaneous Q24H  . fluticasone  2 spray Each Nare Daily  . insulin aspart  0-20 Units Subcutaneous TID WC  . insulin aspart  0-5 Units Subcutaneous QHS  . lisinopril  40 mg Oral Daily  . vitamin C  500 mg Oral Daily  . zinc sulfate  220 mg Oral Daily   Continuous Infusions: . remdesivir 100 mg in NS 100 mL 100 mg (03/20/19 0917)     LOS: 2 days   Time spent: 35 minutes.  Patrecia Pour, MD [facility] [email or web address] 03/20/2019, 11:45 AM

## 2019-03-21 ENCOUNTER — Encounter (HOSPITAL_COMMUNITY): Payer: Self-pay | Admitting: Internal Medicine

## 2019-03-21 LAB — COMPREHENSIVE METABOLIC PANEL
ALT: 24 U/L (ref 0–44)
AST: 35 U/L (ref 15–41)
Albumin: 3.5 g/dL (ref 3.5–5.0)
Alkaline Phosphatase: 62 U/L (ref 38–126)
Anion gap: 15 (ref 5–15)
BUN: 19 mg/dL (ref 6–20)
CO2: 20 mmol/L — ABNORMAL LOW (ref 22–32)
Calcium: 9.1 mg/dL (ref 8.9–10.3)
Chloride: 102 mmol/L (ref 98–111)
Creatinine, Ser: 0.78 mg/dL (ref 0.61–1.24)
GFR calc Af Amer: >60 mL/min (ref >60)
GFR calc non Af Amer: >60 mL/min (ref >60)
Glucose, Bld: 170 mg/dL — ABNORMAL HIGH (ref 70–99)
Potassium: 4.1 mmol/L (ref 3.5–5.1)
Sodium: 137 mmol/L (ref 135–145)
Total Bilirubin: 1.1 mg/dL (ref 0.3–1.2)
Total Protein: 7.8 g/dL (ref 6.5–8.1)

## 2019-03-21 LAB — MAGNESIUM: Magnesium: 2.1 mg/dL (ref 1.7–2.4)

## 2019-03-21 LAB — D-DIMER, QUANTITATIVE: D-Dimer, Quant: 1.74 ug/mL-FEU — ABNORMAL HIGH (ref 0.00–0.50)

## 2019-03-21 LAB — CBC WITH DIFFERENTIAL/PLATELET
Abs Immature Granulocytes: 0.04 10*3/uL (ref 0.00–0.07)
Basophils Absolute: 0 10*3/uL (ref 0.0–0.1)
Basophils Relative: 0 %
Eosinophils Absolute: 0 10*3/uL (ref 0.0–0.5)
Eosinophils Relative: 0 %
HCT: 45.1 % (ref 39.0–52.0)
Hemoglobin: 16.1 g/dL (ref 13.0–17.0)
Immature Granulocytes: 0 %
Lymphocytes Relative: 7 %
Lymphs Abs: 1 10*3/uL (ref 0.7–4.0)
MCH: 29.3 pg (ref 26.0–34.0)
MCHC: 35.7 g/dL (ref 30.0–36.0)
MCV: 82.1 fL (ref 80.0–100.0)
Monocytes Absolute: 0.7 10*3/uL (ref 0.1–1.0)
Monocytes Relative: 5 %
Neutro Abs: 12.4 10*3/uL — ABNORMAL HIGH (ref 1.7–7.7)
Neutrophils Relative %: 88 %
Platelets: 323 10*3/uL (ref 150–400)
RBC: 5.49 MIL/uL (ref 4.22–5.81)
RDW: 12.8 % (ref 11.5–15.5)
WBC: 14.2 10*3/uL — ABNORMAL HIGH (ref 4.0–10.5)
nRBC: 0 % (ref 0.0–0.2)

## 2019-03-21 LAB — GLUCOSE, CAPILLARY
Glucose-Capillary: 217 mg/dL — ABNORMAL HIGH (ref 70–99)
Glucose-Capillary: 172 mg/dL — ABNORMAL HIGH (ref 70–99)
Glucose-Capillary: 206 mg/dL — ABNORMAL HIGH (ref 70–99)

## 2019-03-21 LAB — FERRITIN: Ferritin: 350 ng/mL — ABNORMAL HIGH (ref 24–336)

## 2019-03-21 LAB — PHOSPHORUS: Phosphorus: 2.3 mg/dL — ABNORMAL LOW (ref 2.5–4.6)

## 2019-03-21 LAB — C-REACTIVE PROTEIN: CRP: 3.2 mg/dL — ABNORMAL HIGH (ref <1.0)

## 2019-03-21 MED ORDER — METOCLOPRAMIDE HCL 5 MG/ML IJ SOLN
5.0000 mg | Freq: Four times a day (QID) | INTRAMUSCULAR | Status: DC | PRN
  Administered 2019-03-21 (×2): 5 mg via INTRAVENOUS
  Filled 2019-03-21 (×2): qty 2
  Filled 2019-03-21: qty 1

## 2019-03-21 MED ORDER — K PHOS MONO-SOD PHOS DI & MONO 155-852-130 MG PO TABS
500.0000 mg | ORAL_TABLET | Freq: Two times a day (BID) | ORAL | Status: AC
  Administered 2019-03-21 (×2): 500 mg via ORAL
  Filled 2019-03-21 (×2): qty 2

## 2019-03-21 NOTE — Progress Notes (Signed)
OT Evaluation:  This 58 y/o male presents with the above. PTA pt reports independence with ADL, iADL and functional mobility. Pt performing functional mobility without AD today (x2 laps in room) with overall minguard assist, intermittently with mild unsteadiness but no overt LOB noted. He currently is mod independent with seated UB ADL, requiring minguard-minA for LB ADL. Pt initially on 2L O2 start of session with SpO2 97%, trialled on RA during activity, SpO2 briefly down to 86-87%, but overall maintaining at >/=89% with mobility, pt left on RA end of session with SpO2 back to 97% and RN made aware. Pt reports plans to return home with spouse and mother-in-law. He will benefit from continued acute OT services to progress him towards his PLOF. Will follow.     03/21/19 0900  OT Visit Information  Last OT Received On 03/21/19  Assistance Needed +1  History of Present Illness Pt is a 58 y.o. male admitted 03/18/19 with progressive myalgias and SOB, found to be hypoxic; (+) COVID-19. PMH includes chronic pain, polysubstance abuse, HTN.  Precautions  Precaution Comments monitor O2 sats  Restrictions  Weight Bearing Restrictions No  Home Living  Family/patient expects to be discharged to: Private residence  Living Arrangements Spouse/significant other (and mother-in-law)  Available Help at Discharge Family  Type of [place] to enter  Entrance Stairs-Number of Steps 8  [place] Two level;Able to live on main level with bedroom/bathroom  Bathroom Shower/Tub Tub only;Walk-in shower;Tub/shower unit  Tax adviser - 2 wheels;Wheelchair - manual (both belong to mother-in-law)  Prior Function  Level of Independence Independent  Comments driving, 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  Engineer, petroleum No difficulties  Pain Assessment  Pain Assessment Faces  Faces Pain Scale 4  Pain  Location lower back, R shoulder  Pain Descriptors / Indicators Discomfort;Guarding;Sore  Pain Intervention(s) Limited activity within patient's tolerance;Monitored during session;Repositioned  Cognition  Arousal/Alertness Awake/alert  Behavior During Therapy WFL for tasks assessed/performed  Overall Cognitive Status Within Functional Limits for tasks assessed  Upper Extremity Assessment  Upper Extremity Assessment RUE deficits/detail  RUE Deficits / Details painful R shoulder with ROM, reports has been sleeping on it for too long, able perform necessary functional tasks but with increased effort   Lower Extremity Assessment  Lower Extremity Assessment Defer to PT evaluation  ADL  Overall ADL's  Needs assistance/impaired  Eating/Feeding Independent;Sitting  Grooming Min guard;Standing  Upper Body Bathing Set up;Min guard;Sitting  Lower Body Bathing Minimal assistance;Sit to/from stand  Upper Body Dressing  Set up;Sitting  Lower Body Dressing Minimal assistance;Sit to/from stand  Lower Body Dressing Details (indicate cue type and reason) minguard standing balance  Toilet Transfer Min guard;Ambulation  Toilet Transfer Details (indicate cue type and reason) simulated via transfer to recliner, room level mobility  Toileting- Water quality scientist and Hygiene Min guard;Sit to/from stand  Functional mobility during ADLs Min guard;Minimal assistance (intermittent light minA)  General ADL Comments pt with decreased endurance, initiated education of energy conservation techniques during ADL tasks and activity progression after return home  Bed Mobility  General bed mobility comments pt seated EOB upon arrival  Transfers  Overall transfer level Needs assistance  Equipment used None  Transfers Sit to/from Stand  Sit to Stand Min guard  General transfer comment close minguard for safety as pt requiring increased time and effort to rise to standing, no physical assist required  Balance  Overall  balance assessment Mild deficits observed, not formally tested  Exercises  Exercises Other exercises  Other Exercises  Other Exercises use of flutter valve x10  Other Exercises use of IS x10, pt pulling 750-1010ml   OT - End of Session  Activity Tolerance Patient tolerated treatment well  Patient left in chair;with call bell/phone within reach  Nurse Communication Mobility status;Other (comment) (pt left on RA)  OT Assessment  OT Recommendation/Assessment Patient needs continued OT Services  OT Visit Diagnosis Muscle weakness (generalized) (M62.81);Other (comment) (decreased activity tolerance)  OT Problem List Decreased strength;Decreased activity tolerance;Impaired balance (sitting and/or standing);Decreased knowledge of use of DME or AE;Cardiopulmonary status limiting activity;Pain  OT Plan  OT Frequency (ACUTE ONLY) Min 3X/week  OT Treatment/Interventions (ACUTE ONLY) Self-care/ADL training;Therapeutic exercise;Energy conservation;DME and/or AE instruction;Therapeutic activities;Patient/family education;Balance training  AM-PAC OT "6 Clicks" Daily Activity Outcome Measure (Version 2)  Help from another person eating meals? 4  Help from another person taking care of personal grooming? 4  Help from another person toileting, which includes using toliet, bedpan, or urinal? 4  Help from another person bathing (including washing, rinsing, drying)? 3  Help from another person to put on and taking off regular upper body clothing? 4  Help from another person to put on and taking off regular lower body clothing? 3  6 Click Score 22  OT Recommendation  Follow Up Recommendations No OT follow up;Supervision/Assistance - 24 hour (24hr initially)  OT Equipment None recommended by OT (pt declines needing seat for shower)  Individuals Consulted  Consulted and Agree with Results and Recommendations Patient  Acute Rehab OT Goals  Patient Stated Goal home when able  OT Goal Formulation With  patient  Time For Goal Achievement 04/04/19  Potential to Achieve Goals Good  OT Time Calculation  OT Start Time (ACUTE ONLY) 0942  OT Stop Time (ACUTE ONLY) 1012  OT Time Calculation (min) 30 min  OT General Charges  $OT Visit 1 Visit  OT Evaluation  $OT Eval Moderate Complexity 1 Mod  OT Treatments  $Self Care/Home Management  8-22 mins  Written Expression  Dominant Hand Right    Lou Cal, OT [facility] Pager [phone] Office [phone]

## 2019-03-21 NOTE — Progress Notes (Signed)
Wife called, no answer at this time.

## 2019-03-21 NOTE — Progress Notes (Signed)
PROGRESS NOTE  ADEAN BLOMME  [id number] DOB: [birth year] DOA: 03/18/2019 PCP: [name], DO   Brief Narrative: Grandson's mother exposed him, felt myalgias diffusely 11/29, Dx covid without hypoxia 11/30, isolating but worsened SOB so presented to UC where he was hypoxic, transferred to ED where CXR showed bilateral pulmonary infiltrates and he was tachypneic, hypoxic requiring NRB initially, weaned to nasal cannula. CRP 9, PCT negative. Remdesivir and steroids started, admitted to [facility] 12/8.   Assessment & Plan: Principal Problem:   Acute respiratory failure with hypoxia (HCC) Active Problems:   Essential hypertension   Psoriasis   Chronic pain   H/O alcohol abuse   H/O drug abuse ([place])   Hyperlipidemia   Pneumonia due to COVID-19 virus  Acute hypoxic respiratory failure due to covid-19 pneumonia: Now on 13L O2 but with no increased respiratory effort, SpO2 is improving, seems to be prone-responsive. - Continue remdesivir 12/8 - 12/12 - Continue steroids. CRP improved. Hypoxia resolved at rest.  - No indication of need for other therapies. - Continue airborne, contact precautions.   HTN: Uncontrolled - Increased norvasc dose, continue lisinopril. Add hydralazine prn. Now at goal.   T2DM with steroid-induced hyperglycemia: Well-controlled with HbA1c 6.6%. - SSI while admitted. Added linagliptin. CBG elevated but not severely so.   Plaque psoriasis:  - Clobetasol topically, continue.  Obesity: BMI 32. Noted  Epistaxis: Self-limited. Hgb stable/normal.  - Continue flonase, add ocean nasal spray. Anticipate improvement with decreased oxygen flow rate. Feel risk of DVT outweighs risk of uncontrolled bleeding at this time, continue lovenox.  DVT prophylaxis: Lovenox Code Status: Full Family Communication: None at bedside, declined offer to call Disposition Plan: DC home 12/12  Consultants:   None  Procedures:   None  Antimicrobials:  Remdesivir    Subjective: No further bleeding reported. Does have some discomfort across chest only with deep breaths. Shortness of breath has resolved.  Objective: Vitals:   03/21/19 0359 03/21/19 0735 03/21/19 1054 03/21/19 1521  BP: 130/75 113/67  117/72  Pulse: 89 85  88  Resp: (!) 25 16  (!) 22  Temp: 97.7 F (36.5 C) 97.8 F (36.6 C)  98 F (36.7 C)  TempSrc: Oral Oral  Oral  SpO2:  99% 97% 99%  Weight:      Height:        Intake/Output Summary (Last 24 hours) at 03/21/2019 1552 Last data filed at 03/21/2019 1300 Gross per 24 hour  Intake 1400 ml  Output 700 ml  Net 700 ml   Filed Weights   03/18/19 1638 03/19/19 0340  Weight: 108.9 kg 105.2 kg   Gen: 58 y.o. male in no distress Pulm: Nonlabored breathing room air. Clear. CV: Regular rate and rhythm. No murmur, rub, or gallop. No JVD, no dependent edema. GI: Abdomen soft, non-tender, non-distended, with normoactive bowel sounds.  Ext: Warm, no deformities Skin: No rashes, lesions or ulcers on visualized skin. Neuro: Alert and oriented. No focal neurological deficits. Psych: Judgement and insight appear fair. Mood euthymic & affect congruent. Behavior is appropriate.    Data Reviewed: I have personally reviewed following labs and imaging studies  CBC: Recent Labs  Lab 03/18/19 1648 03/18/19 2235 03/19/19 0830 03/20/19 0120 03/21/19 0100  WBC 4.6 4.6 2.8* 5.8 14.2*  NEUTROABS  --   --  1.8 4.5 12.4*  HGB 15.9 16.1 14.7 15.4 16.1  HCT 44.9 44.4 40.0 42.9 45.1  MCV 83.3 82.2 81.8 81.6 82.1  PLT 215 212 217 282 323  Basic Metabolic Panel: Recent Labs  Lab 03/18/19 1648 03/18/19 2235 03/19/19 0830 03/20/19 0120 03/21/19 0100  NA 134*  --  136 137 137  K 3.8  --  4.2 4.1 4.1  CL 99  --  105 105 102  CO2 24  --  21* 21* 20*  GLUCOSE 141*  --  176* 198* 170*  BUN 16  --  12 15 19   CREATININE 1.08 0.98 0.73 0.70 0.78  CALCIUM 8.9  --  8.4* 9.0 9.1  MG  --   --  1.9 2.0 2.1  PHOS  --   --  2.1* 1.8* 2.3*    GFR: Estimated Creatinine Clearance: 124.3 mL/min (by C-G formula based on SCr of 0.78 mg/dL). Liver Function Tests: Recent Labs  Lab 03/18/19 1648 03/19/19 0830 03/20/19 0120 03/21/19 0100  AST 38 35 37 35  ALT 24 22 22 24   ALKPHOS 56 46 49 62  BILITOT 0.8 1.0 0.9 1.1  PROT 7.9 7.0 7.4 7.8  ALBUMIN 3.6 3.0* 3.1* 3.5   No results for input(s): LIPASE, AMYLASE in the last 168 hours. No results for input(s): AMMONIA in the last 168 hours. Coagulation Profile: No results for input(s): INR, PROTIME in the last 168 hours. Cardiac Enzymes: No results for input(s): CKTOTAL, CKMB, CKMBINDEX, TROPONINI in the last 168 hours. BNP (last 3 results) No results for input(s): PROBNP in the last 8760 hours. HbA1C: Recent Labs    03/18/19 2235  HGBA1C 6.6*   CBG: Recent Labs  Lab 03/20/19 1145 03/20/19 1618 03/20/19 2051 03/21/19 0723 03/21/19 1210  GLUCAP 220* 164* 201* 217* 172*   Lipid Profile: Recent Labs    03/18/19 1934  TRIG 115   Thyroid Function Tests: No results for input(s): TSH, T4TOTAL, FREET4, T3FREE, THYROIDAB in the last 72 hours. Anemia Panel: Recent Labs    03/20/19 0120 03/21/19 0100  FERRITIN 291 350*   Urine analysis:    Component Value Date/Time   COLORURINE YELLOW 10/14/2017 1537   APPEARANCEUR CLEAR 10/14/2017 1537   LABSPEC 1.011 10/14/2017 1537   PHURINE 7.0 10/14/2017 1537   GLUCOSEU NEGATIVE 10/14/2017 1537   HGBUR NEGATIVE 10/14/2017 1537   BILIRUBINUR NEGATIVE 10/14/2017 1537   KETONESUR NEGATIVE 10/14/2017 1537   PROTEINUR NEGATIVE 10/14/2017 1537   UROBILINOGEN 1.0 07/22/2014 2336   NITRITE NEGATIVE 10/14/2017 1537   LEUKOCYTESUR NEGATIVE 10/14/2017 1537   Recent Results (from the past 240 hour(s))  Blood Culture (routine x 2)     Status: None (Preliminary result)   Collection Time: 03/18/19  7:13 PM   Specimen: BLOOD  Result Value Ref Range Status   Specimen Description BLOOD LEFT ANTECUBITAL  Final   Special Requests    Final    BOTTLES DRAWN AEROBIC AND ANAEROBIC Blood Culture adequate volume   Culture   Final    NO GROWTH 3 DAYS Performed at [facility], [place] [address] [place], [place] [address]    Report Status PENDING  Incomplete  Blood Culture (routine x 2)     Status: None (Preliminary result)   Collection Time: 03/18/19  7:35 PM   Specimen: BLOOD  Result Value Ref Range Status   Specimen Description BLOOD RIGHT ANTECUBITAL  Final   Special Requests   Final    BOTTLES DRAWN AEROBIC AND ANAEROBIC Blood Culture adequate volume   Culture   Final    NO GROWTH 3 DAYS Performed at [facility], [place] [address], [place],  [address]  Report Status PENDING  Incomplete      Radiology Studies: No results found.  Scheduled Meds: . albuterol  2 puff Inhalation Q6H  . amLODipine  10 mg Oral Daily  . amLODipine  5 mg Oral Once  . clobetasol ointment   Topical BID  . dexamethasone (DECADRON) injection  6 mg Intravenous Q24H  . diclofenac Sodium  4 g Topical QID  . enoxaparin (LOVENOX) injection  40 mg Subcutaneous Q24H  . fluticasone  2 spray Each Nare Daily  . insulin aspart  0-20 Units Subcutaneous TID WC  . insulin aspart  0-5 Units Subcutaneous QHS  . linagliptin  5 mg Oral Daily  . lisinopril  40 mg Oral Daily  . phosphorus  500 mg Oral BID  . vitamin C  500 mg Oral Daily  . zinc sulfate  220 mg Oral Daily   Continuous Infusions: . remdesivir 100 mg in NS 100 mL 100 mg (03/21/19 0745)     LOS: 3 days   Time spent: 25 minutes.  Patrecia Pour, MD [facility] [email or web address] 03/21/2019, 3:52 PM

## 2019-03-21 NOTE — Plan of Care (Signed)
Pt tolerating 1.5-2L of O2, but still with shortness of breath with exertion. Other than hiccups, no changes noted during the night.  Problem: Education: Goal: Knowledge of risk factors and measures for prevention of condition will improve Outcome: Progressing   Problem: Respiratory: Goal: Will maintain a patent airway Outcome: Progressing Goal: Complications related to the disease process, condition or treatment will be avoided or minimized Outcome: Progressing

## 2019-03-22 LAB — CBC WITH DIFFERENTIAL/PLATELET
Abs Immature Granulocytes: 0.06 10*3/uL (ref 0.00–0.07)
Basophils Absolute: 0 10*3/uL (ref 0.0–0.1)
Basophils Relative: 0 %
Eosinophils Absolute: 0 10*3/uL (ref 0.0–0.5)
Eosinophils Relative: 0 %
HCT: 40.6 % (ref 39.0–52.0)
Hemoglobin: 14.5 g/dL (ref 13.0–17.0)
Immature Granulocytes: 1 %
Lymphocytes Relative: 7 %
Lymphs Abs: 0.8 10*3/uL (ref 0.7–4.0)
MCH: 28.9 pg (ref 26.0–34.0)
MCHC: 35.7 g/dL (ref 30.0–36.0)
MCV: 81 fL (ref 80.0–100.0)
Monocytes Absolute: 0.7 10*3/uL (ref 0.1–1.0)
Monocytes Relative: 6 %
Neutro Abs: 9.8 10*3/uL — ABNORMAL HIGH (ref 1.7–7.7)
Neutrophils Relative %: 86 %
Platelets: 371 10*3/uL (ref 150–400)
RBC: 5.01 MIL/uL (ref 4.22–5.81)
RDW: 12.5 % (ref 11.5–15.5)
WBC: 11.4 10*3/uL — ABNORMAL HIGH (ref 4.0–10.5)
nRBC: 0 % (ref 0.0–0.2)

## 2019-03-22 LAB — FERRITIN: Ferritin: 260 ng/mL (ref 24–336)

## 2019-03-22 LAB — C-REACTIVE PROTEIN: CRP: 1.2 mg/dL — ABNORMAL HIGH (ref <1.0)

## 2019-03-22 LAB — GLUCOSE, CAPILLARY
Glucose-Capillary: 210 mg/dL — ABNORMAL HIGH (ref 70–99)
Glucose-Capillary: 183 mg/dL — ABNORMAL HIGH (ref 70–99)
Glucose-Capillary: 195 mg/dL — ABNORMAL HIGH (ref 70–99)

## 2019-03-22 LAB — COMPREHENSIVE METABOLIC PANEL
ALT: 23 U/L (ref 0–44)
AST: 28 U/L (ref 15–41)
Albumin: 3.1 g/dL — ABNORMAL LOW (ref 3.5–5.0)
Alkaline Phosphatase: 65 U/L (ref 38–126)
Anion gap: 11 (ref 5–15)
BUN: 20 mg/dL (ref 6–20)
CO2: 22 mmol/L (ref 22–32)
Calcium: 8.5 mg/dL — ABNORMAL LOW (ref 8.9–10.3)
Chloride: 103 mmol/L (ref 98–111)
Creatinine, Ser: 0.76 mg/dL (ref 0.61–1.24)
GFR calc Af Amer: >60 mL/min (ref >60)
GFR calc non Af Amer: >60 mL/min (ref >60)
Glucose, Bld: 203 mg/dL — ABNORMAL HIGH (ref 70–99)
Potassium: 4 mmol/L (ref 3.5–5.1)
Sodium: 136 mmol/L (ref 135–145)
Total Bilirubin: 1 mg/dL (ref 0.3–1.2)
Total Protein: 6.8 g/dL (ref 6.5–8.1)

## 2019-03-22 LAB — MAGNESIUM: Magnesium: 2.1 mg/dL (ref 1.7–2.4)

## 2019-03-22 LAB — PHOSPHORUS: Phosphorus: 2.3 mg/dL — ABNORMAL LOW (ref 2.5–4.6)

## 2019-03-22 LAB — D-DIMER, QUANTITATIVE: D-Dimer, Quant: 1.28 ug/mL-FEU — ABNORMAL HIGH (ref 0.00–0.50)

## 2019-03-22 MED ORDER — HYDROCOD POLST-CPM POLST ER 10-8 MG/5ML PO SUER: 5.0000 mL | Freq: Two times a day (BID) | ORAL | 0 refills | Status: DC | PRN

## 2019-03-22 MED ORDER — DEXAMETHASONE 6 MG PO TABS: 6.0000 mg | ORAL_TABLET | Freq: Every day | ORAL | 0 refills | Status: DC

## 2019-03-22 NOTE — Discharge Summary (Signed)
Physician Discharge Summary  Cameron Diaz GBE:[id number] DOB: 1961/03/19 DOA: 03/18/2019  PCP: [name], DO  Admit date: 03/18/2019 Discharge date: 03/22/2019  Admitted From: Home Disposition: Home   Recommendations for Outpatient Follow-up:  1. Follow up with PCP in 1-2 weeks 2. Please obtain CMP/CBC in one week  Home Health: None Equipment/Devices: 2L O2 with exertion Discharge Condition: Stable CODE STATUS: Full Diet recommendation: Heart healthy  Brief/Interim Summary: Cameron Diaz is a 58 y.o. male with a remote history of alcohol and drug abuse per EMR, none currently, as well as HTN, psoriasis, and HLD who was admitted for covid-19 pneumonia, treated with remdesivir and steroids with improvement, and discharged on hospital day #5. He reports initially he felt myalgias diffusely 11/29, Dx covid without hypoxia 11/30, isolating but worsened SOB so presented to UC where he was hypoxic, transferred to ED where CXR showed bilateral pulmonary infiltrates and he was tachypneic, hypoxic requiring NRB initially, weaned to nasal cannula. CRP 9, PCT negative. Remdesivir and steroids started, admitted to [facility] 12/8. Has shown significant improvement and is stable for discharge 12/12.  Discharge Diagnoses:  Principal Problem:   Acute respiratory failure with hypoxia (HCC) Active Problems:   Essential hypertension   Psoriasis   Chronic pain   H/O alcohol abuse   H/O drug abuse ([place])   Hyperlipidemia   Pneumonia due to COVID-19 virus  Acute hypoxic respiratory failure due to covid-19 pneumonia: Now on 13L O2 but with no increased respiratory effort, SpO2 is improving, seems to be prone-responsive. - Continued remdesivir 12/8 - 12/12 - Continue steroids. CRP improved. Hypoxia resolved at rest.  - No indication of need for other therapies. - Continue isolated x21 days from first positive test (which was 11/30). - Antitussive ordered, sent to pharmacy after PDMP review. Pt has  had single controlled prescription in July 2019.  HTN: Uncontrolled - Increased norvasc dose, continued lisinopril. Now at goal. Will not make changes to chronic regimen based on inpatient trends alone, recommend PCP follow up.  T2DM with steroid-induced hyperglycemia: Well-controlled with HbA1c 6.6%. - SSI while admitted. Continue metformin. PCP follow up.  Plaque psoriasis:  - Clobetasol topically, recommend continued management based on clinical response.  Obesity: BMI 32. Noted  Epistaxis: Self-limited. Hgb stable/normal.  - Continue flonase, add ocean nasal spray. Anticipate improvement with decreased oxygen flow rate. Feel risk of DVT outweighs risk of uncontrolled bleeding at this time, continue lovenox.  Discharge Instructions Discharge Instructions    Diet Carb Modified   Complete by: As directed    Discharge instructions   Complete by: As directed    You are being discharged from the hospital after treatment for covid-19 infection. You are felt to be stable enough to no longer require inpatient monitoring, testing, and treatment, though you will need to follow the recommendations below: - Continue taking decadron for 5 more days (sent to pharmacy) - Remain in self-isolation for 21 days from your first positive test (which was 11/30).  - Do not take NSAID medications (including, but not limited to, ibuprofen, advil, motrin, naproxen, aleve, goody's powder, etc.) - Follow up with your doctor in the next week via telehealth or seek medical attention right away if your symptoms get WORSE.  - Consider donating plasma after you have recovered (either 14 days after a negative test or 28 days after symptoms have completely resolved) because your antibodies to this virus may be helpful to give to others with life-threatening infections. Please go to the website  www.oneblood.org if you would like to consider volunteering for plasma donation.    Directions for you at home:  Wear a  facemask You should wear a facemask that covers your nose and mouth when you are in the same room with other people and when you visit a healthcare provider. People who live with or visit you should also wear a facemask while they are in the same room with you.  Separate yourself from other people in your home As much as possible, you should stay in a different room from other people in your home. Also, you should use a separate bathroom, if available.  Avoid sharing household items You should not share dishes, drinking glasses, cups, eating utensils, towels, bedding, or other items with other people in your home. After using these items, you should wash them thoroughly with soap and water.  Cover your coughs and sneezes Cover your mouth and nose with a tissue when you cough or sneeze, or you can cough or sneeze into your sleeve. Throw used tissues in a lined trash can, and immediately wash your hands with soap and water for at least 20 seconds or use an alcohol-based hand rub.  Wash your [facility] your hands often and thoroughly with soap and water for at least 20 seconds. You can use an alcohol-based hand sanitizer if soap and water are not available and if your hands are not visibly dirty. Avoid touching your eyes, nose, and mouth with unwashed hands.  Directions for those who live with, or provide care at home for you:  Limit the number of people who have contact with the patient If possible, have only one caregiver for the patient. Other household members should stay in another home or place of residence. If this is not possible, they should stay in another room, or be separated from the patient as much as possible. Use a separate bathroom, if available. Restrict visitors who do not have an essential need to be in the home.  Ensure good ventilation Make sure that shared spaces in the home have good air flow, such as from an air conditioner or an opened window, weather  permitting.  Wash your hands often Wash your hands often and thoroughly with soap and water for at least 20 seconds. You can use an alcohol based hand sanitizer if soap and water are not available and if your hands are not visibly dirty. Avoid touching your eyes, nose, and mouth with unwashed hands. Use disposable paper towels to dry your hands. If not available, use dedicated cloth towels and replace them when they become wet.  Wear a facemask and gloves Wear a disposable facemask at all times in the room and gloves when you touch or have contact with the patient's blood, body fluids, and/or secretions or excretions, such as sweat, saliva, sputum, nasal mucus, vomit, urine, or feces.  Ensure the mask fits over your nose and mouth tightly, and do not touch it during use. Throw out disposable facemasks and gloves after using them. Do not reuse. Wash your hands immediately after removing your facemask and gloves. If your personal clothing becomes contaminated, carefully remove clothing and launder. Wash your hands after handling contaminated clothing. Place all used disposable facemasks, gloves, and other waste in a lined container before disposing them with other household waste. Remove gloves and wash your hands immediately after handling these items.  Do not share dishes, glasses, or other household items with the patient Avoid sharing household items. You should not  share dishes, drinking glasses, cups, eating utensils, towels, bedding, or other items with a patient who is confirmed to have, or being evaluated for, COVID-19 infection. After the person uses these items, you should wash them thoroughly with soap and water.  Wash laundry thoroughly Immediately remove and wash clothes or bedding that have blood, body fluids, and/or secretions or excretions, such as sweat, saliva, sputum, nasal mucus, vomit, urine, or feces, on them. Wear gloves when handling laundry from the patient. Read and  follow directions on labels of laundry or clothing items and detergent. In general, wash and dry with the warmest temperatures recommended on the label.  Clean all areas the individual has used often Clean all touchable surfaces, such as counters, tabletops, doorknobs, bathroom fixtures, toilets, phones, keyboards, tablets, and bedside tables, every day. Also, clean any surfaces that may have blood, body fluids, and/or secretions or excretions on them. Wear gloves when cleaning surfaces the patient has come in contact with. Use a diluted bleach solution (e.g., dilute bleach with 1 part bleach and 10 parts water) or a household disinfectant with a label that says EPA-registered for coronaviruses. To make a bleach solution at home, add 1 tablespoon of bleach to 1 quart (4 cups) of water. For a larger supply, add  cup of bleach to 1 gallon (16 cups) of water. Read labels of cleaning products and follow recommendations provided on product labels. Labels contain instructions for safe and effective use of the cleaning product including precautions you should take when applying the product, such as wearing gloves or eye protection and making sure you have good ventilation during use of the product. Remove gloves and wash hands immediately after cleaning.  Monitor yourself for signs and symptoms of illness Caregivers and household members are considered close contacts, should monitor their health, and will be asked to limit movement outside of the home to the extent possible. Follow the monitoring steps for close contacts listed on the symptom monitoring form.  If you have additional questions, contact your local health department or call the epidemiologist on call at [phone] (available 24/7). This guidance is subject to change. For the most up-to-date guidance from [facility], please refer to their website: YouBlogs.pl   Increase activity slowly    Complete by: As directed    MyChart COVID-19 home monitoring program   Complete by: Mar 22, 2019    Is the patient willing to use the [place] for home monitoring?: Yes     Allergies as of 03/22/2019   No Known Allergies     Medication List    TAKE these medications   amLODipine 5 MG tablet Commonly known as: NORVASC Take 1 tablet by mouth once daily   benzonatate 100 MG capsule Commonly known as: TESSALON Take 1 capsule (100 mg total) by mouth every 8 (eight) hours.   chlorpheniramine-HYDROcodone 10-8 MG/5ML Suer Commonly known as: TUSSIONEX Take 5 mLs by mouth every 12 (twelve) hours as needed for cough.   dexamethasone 6 MG tablet Commonly known as: Decadron Take 1 tablet (6 mg total) by mouth daily for 10 days.   fluorometholone 0.1 % ophthalmic suspension Commonly known as: FML Place 1 drop into the left eye 4 (four) times daily.   fluticasone 50 MCG/ACT nasal spray Commonly known as: FLONASE Place 2 sprays into both nostrils daily.   lisinopril 40 MG tablet Commonly known as: ZESTRIL Take 1 tablet by mouth once daily   metFORMIN 500 MG 24 hr tablet Commonly known as: GLUCOPHAGE-XR  Take 1 tablet (500 mg total) by mouth at bedtime. What changed: when to take this   ONE TOUCH ULTRA 2 w/Device Kit 1 glucometer.  Patient to test fasting CBG once daily.   ONE TOUCH ULTRA TEST test strip Generic drug: glucose blood USE AS INSTRUCTED TO TEST CBG FASTING ONCE DAILY. What changed: See the new instructions.   Voltaren 1 % Gel Generic drug: diclofenac Sodium Apply 4 g topically See admin instructions. Apply 4 grams to entire lower back ("hip to hip") two times a day            Durable Medical Equipment  (From admission, onward)         Start     Ordered   03/22/19 1046  For home use only DME oxygen  Once    Question Answer Comment  Length of Need 6 Months   Mode or (Route) Nasal cannula   Liters per Minute 2   Frequency Continuous  (stationary and portable oxygen unit needed)   Oxygen delivery system Gas      03/22/19 1045         Follow-up Information    [name], DO. Schedule an appointment as soon as possible for a visit in 1 week(s).   Specialty: Family Medicine Contact information: [address] [place] [address] [place] Follow up.   Why: A representative from [facility] will contact you to deliver concentrator and tanks to your home. Should you have any problems please call [employer] at the number listed. Contact information: [place] [address] [phone]          No Known Allergies  Consultations:  None  Procedures/Studies: DG Chest Port 1 View  Result Date: 03/18/2019 CLINICAL DATA:  COVID-19 diagnosed on 03/10/2019, shortness of breath and cough for 2-3 days, hypoxia, diabetes mellitus, hypertension EXAM: PORTABLE CHEST 1 VIEW COMPARISON:  Portable exam 1649 hours compared to 03/10/2019 FINDINGS: Upper normal heart size. Mediastinal contours and pulmonary vascularity normal. Patchy airspace infiltrates in the mid to lower lungs bilaterally consistent with multifocal pneumonia and history of COVID-19. Subsegmental atelectasis RIGHT base. No pleural effusion or pneumothorax. IMPRESSION: BILATERAL pulmonary infiltrates consistent with COVID-19 pneumonia. Electronically Signed   By: Lavonia Dana M.D.   On: 03/18/2019 17:05   DG Chest Portable 1 View  Result Date: 03/10/2019 CLINICAL DATA:  Cough EXAM: PORTABLE CHEST 1 VIEW COMPARISON:  Sep 01, 2010 FINDINGS: The heart size and mediastinal contours are within normal limits. Both lungs are clear. The visualized skeletal structures are unremarkable. IMPRESSION: No active disease. Electronically Signed   By: Constance Holster M.D.   On: 03/10/2019 16:00    Subjective: Feels well, cough medication at night helps him sleep, still coughing fairly frequently. No chest pain. Ready  to go home.  Discharge Exam: Vitals:   03/21/19 1931 03/22/19 0438  BP: 134/70 128/76  Pulse: 96 91  Resp: 19   Temp: 97.9 F (36.6 C) 98.5 F (36.9 C)  SpO2: 90% (!) 85%   General: Pt is alert, awake, not in acute distress Cardiovascular: RRR, S1/S2 +, no rubs, no gallops Respiratory: CTA bilaterally, no wheezing, no rhonchi Abdominal: Soft, NT, ND, bowel sounds + Extremities: No edema, no cyanosis  Labs: BNP (last 3 results) No results for input(s): BNP in the last 8760 hours. Basic Metabolic Panel: Recent Labs  Lab 03/18/19 1648 03/18/19 2235 03/19/19 0830 03/20/19 0120 03/21/19 0100 03/22/19  0307  NA 134*  --  136 137 137 136  K 3.8  --  4.2 4.1 4.1 4.0  CL 99  --  105 105 102 103  CO2 24  --  21* 21* 20* 22  GLUCOSE 141*  --  176* 198* 170* 203*  BUN 16  --  _0 CREATININE 1.08 0.98 0.73 0.70 0.78 0.76  CALCIUM 8.9  --  8.4* 9.0 9.1 8.5*  MG  --   --  1.9 2.0 2.1 2.1  PHOS  --   --  2.1* 1.8* 2.3* 2.3*   Liver Function Tests: Recent Labs  Lab 03/18/19 1648 03/19/19 0830 03/20/19 0120 03/21/19 0100 03/22/19 0307  AST 38 35 37 35 28  ALT _1 ALKPHOS 56 46 49 62 65  BILITOT 0.8 1.0 0.9 1.1 1.0  PROT 7.9 7.0 7.4 7.8 6.8  ALBUMIN 3.6 3.0* 3.1* 3.5 3.1*   No results for input(s): LIPASE, AMYLASE in the last 168 hours. No results for input(s): AMMONIA in the last 168 hours. CBC: Recent Labs  Lab 03/18/19 2235 03/19/19 0830 03/20/19 0120 03/21/19 0100 03/22/19 0307  WBC 4.6 2.8* 5.8 14.2* 11.4*  NEUTROABS  --  1.8 4.5 12.4* 9.8*  HGB 16.1 14.7 15.4 16.1 14.5  HCT 44.4 40.0 42.9 45.1 40.6  MCV 82.2 81.8 81.6 82.1 81.0  PLT 212 217 282 323 371   Cardiac Enzymes: No results for input(s): CKTOTAL, CKMB, CKMBINDEX, TROPONINI in the last 168 hours. BNP: Invalid input(s): POCBNP CBG: Recent Labs  Lab 03/21/19 0723 03/21/19 1210 03/21/19 1934 03/22/19 0755 03/22/19 1134  GLUCAP 217* 172* 206* 210* 183*   D-Dimer Recent  Labs    03/21/19 0100 03/22/19 0307  DDIMER 1.74* 1.28*   Hgb A1c No results for input(s): HGBA1C in the last 72 hours. Lipid Profile No results for input(s): CHOL, HDL, LDLCALC, TRIG, CHOLHDL, LDLDIRECT in the last 72 hours. Thyroid function studies No results for input(s): TSH, T4TOTAL, T3FREE, THYROIDAB in the last 72 hours.  Invalid input(s): FREET3 Anemia work up Recent Labs    03/21/19 0100 03/22/19 0307  FERRITIN 350* 260   Urinalysis    Component Value Date/Time   COLORURINE YELLOW 10/14/2017 1537   APPEARANCEUR CLEAR 10/14/2017 1537   LABSPEC 1.011 10/14/2017 1537   PHURINE 7.0 10/14/2017 1537   GLUCOSEU NEGATIVE 10/14/2017 1537   HGBUR NEGATIVE 10/14/2017 1537   BILIRUBINUR NEGATIVE 10/14/2017 1537   KETONESUR NEGATIVE 10/14/2017 1537   PROTEINUR NEGATIVE 10/14/2017 1537   UROBILINOGEN 1.0 07/22/2014 2336   NITRITE NEGATIVE 10/14/2017 1537   LEUKOCYTESUR NEGATIVE 10/14/2017 1537    Microbiology Recent Results (from the past 240 hour(s))  Blood Culture (routine x 2)     Status: None (Preliminary result)   Collection Time: 03/18/19  7:13 PM   Specimen: BLOOD  Result Value Ref Range Status   Specimen Description BLOOD LEFT ANTECUBITAL  Final   Special Requests   Final    BOTTLES DRAWN AEROBIC AND ANAEROBIC Blood Culture adequate volume   Culture   Final    NO GROWTH 4 DAYS Performed at [facility], [place] [address] [place], [place] [address]    Report Status PENDING  Incomplete  Blood Culture (routine x 2)     Status: None (Preliminary result)   Collection Time: 03/18/19  7:35 PM   Specimen: BLOOD  Result Value Ref Range Status   Specimen Description BLOOD RIGHT ANTECUBITAL  Final  Special Requests   Final    BOTTLES DRAWN AEROBIC AND ANAEROBIC Blood Culture adequate volume   Culture   Final    NO GROWTH 4 DAYS Performed at [facility], [place] [address], [place], [place] [address]    Report Status PENDING  Incomplete    Time  coordinating discharge: Approximately 40 minutes  Patrecia Pour, MD  [facility] 03/22/2019, 1:35 PM

## 2019-03-22 NOTE — TOC Progression Note (Signed)
Transition of Care [facility]) - Progression Note    Patient Details  Name: THUY ISAIAH MRN: [id number] Date of Birth: 08-Feb-1961  Transition of [facility]) CM/SW Contact  Loletha Grayer Beverely Pace, RN Phone Number:  [phone] (working remotely) 03/22/2019, 12:25 PM  Clinical Narrative:  Case manager spoke with patient's wife via telephone concerning patient's need for oxygen at home. Case manager explained that she would need to be home for delivery of concentrator and tanks. Referral was called to Learta Codding with [employer]. Case manager requested that Brianne, [employer] take a portable tank to patient's room for discharge. Patient will be able to leave when its confirmed tanks are at the home.   Expected Discharge Plan: Home/Self Care Barriers to Discharge: No Barriers Identified  Expected Discharge Plan and Services Expected Discharge Plan: Home/Self Care   Discharge Planning Services: CM Consult Post Acute Care Choice: Durable Medical Equipment Living arrangements for the past 2 months: Single Family Home Expected Discharge Date: 03/22/19               DME Arranged: Oxygen DME Agency: [place] Date DME Agency Contacted: 03/22/19 Time DME Agency Contacted: 1215 Representative spoke with at DME Agency: Learta Codding HH Arranged: NA [place] Agency: NA         Social Determinants of Health ([place]) Interventions    Readmission Risk Interventions No flowsheet data found.

## 2019-03-22 NOTE — Evaluation (Signed)
Physical Therapy Evaluation Patient Details Name: Cameron Diaz MRN: [id number] DOB: 1960-09-10 Today's Date: 03/22/2019   History of Present Illness  Pt is a 58 y.o. male admitted 03/18/19 with progressive myalgias and SOB, found to be hypoxic; (+) COVID-19. PMH includes chronic pain, polysubstance abuse, HTN.  Clinical Impression   Pt admitted with above diagnosis. PTA was independent and living at home with family. Pt currently with functional limitations due to the deficits listed below (see PT Problem List). This am pt is quite independent, at therapist arrival has just completed bathing himself in bathroom and dressing in anticipation for d/c. Pt agreeable to assessment at this time. Pt at mod I with bed mob and tranfers and ambulation in room, in hall ambulated approx 315ft with SBA-mod I, pt on room air. At end of ambulation 02 sats checked and pt sats at 80%, with cues and pursed lip breathing able to recover to 86/87% on room air. While sitting in recliner giving hx etc sats on room air 89/90%. Pt desaturates on room air with mobility and requires supplemental oxygen to maintain a safe saturation level.     Follow Up Recommendations No PT follow up    Equipment Recommendations  Other (comment)(02 )    Recommendations for Other Services       Precautions / Restrictions Precautions Precaution Comments: monitor 02 Restrictions Weight Bearing Restrictions: No      Mobility  Bed Mobility               General bed mobility comments: pt found in bathroom states has just completed washing up and dressing in preparation for d/c home  Transfers Overall transfer level: Modified independent Equipment used: None Transfers: Sit to/from Stand Sit to Stand: Modified independent (Device/Increase time)            Ambulation/Gait Ambulation/Gait assistance: Modified independent (Device/Increase time);Supervision Gait Distance (Feet): 300 Feet Assistive device: None Gait  Pattern/deviations: Step-through pattern     General Gait Details: ambulated approx 328ft with SBA-mod I and room air at end noted had desat to 80% cues for pursed lip breathing given and pt able to recover to high 86/87 with 1-1mins  Stairs            Wheelchair Mobility    Modified Rankin (Stroke Patients Only)       Balance Overall balance assessment: No apparent balance deficits (not formally assessed)                                           Pertinent Vitals/Pain Pain Assessment: No/denies pain    Home Living Family/patient expects to be discharged to:: Private residence Living Arrangements: Spouse/significant other Available Help at Discharge: Family Type of Home: House Home Access: Stairs to enter Entrance Stairs-Rails: Psychiatric nurse of Steps: 8 Home Layout: Two level;Able to live on main level with bedroom/bathroom Home Equipment: Gilford Rile - 2 wheels;Wheelchair - manual      Prior Function Level of Independence: Independent               Hand Dominance   Dominant Hand: Right    Extremity/Trunk Assessment   Upper Extremity Assessment Upper Extremity Assessment: Defer to OT evaluation    Lower Extremity Assessment Lower Extremity Assessment: Overall WFL for tasks assessed    Cervical / Trunk Assessment Cervical / Trunk Assessment: Normal  Communication   Communication: No  difficulties  Cognition Arousal/Alertness: Awake/alert Behavior During Therapy: WFL for tasks assessed/performed Overall Cognitive Status: Within Functional Limits for tasks assessed                                        General Comments      Exercises Other Exercises Other Exercises: reinforced use of flutter valve to increase 02 sats   Assessment/Plan    PT Assessment Patent does not need any further PT services  PT Problem List         PT Treatment Interventions      PT Goals (Current goals can be  found in the Care Plan section)  Acute Rehab PT Goals Patient Stated Goal: to go home    Frequency     Barriers to discharge        Co-evaluation               AM-PAC PT "6 Clicks" Mobility  Outcome Measure Help needed turning from your back to your side while in a flat bed without using bedrails?: None Help needed moving from lying on your back to sitting on the side of a flat bed without using bedrails?: None Help needed moving to and from a bed to a chair (including a wheelchair)?: None Help needed standing up from a chair using your arms (e.g., wheelchair or bedside chair)?: None Help needed to walk in hospital room?: None Help needed climbing 3-5 steps with a railing? : A Little 6 Click Score: 23    End of Session   Activity Tolerance: Patient tolerated treatment well Patient left: in chair;with call bell/phone within reach;with nursing/sitter in room Nurse Communication: Other (comment)(02 needs) PT Visit Diagnosis: Other abnormalities of gait and mobility (R26.89)    Time: 0953-1010 PT Time Calculation (min) (ACUTE ONLY): 17 min   Charges:   PT Evaluation $PT Eval Low Complexity: 1 Low          Horald Chestnut, PT   Delford Field 03/22/2019, 1:36 PM

## 2019-03-22 NOTE — Progress Notes (Addendum)
SATURATION QUALIFICATIONS: (This note is used to comply with regulatory documentation for home oxygen)  Patient Saturations on Room Air at Rest =90%  Patient Saturations on Room Air while Ambulating = 80% Patient Saturations on 2 Liters of oxygen while Ambulating = 92%  Please briefly explain why patient needs home oxygen: Pt unable to maintain oxygen saturations above 90% without O2.

## 2019-03-23 LAB — CULTURE, BLOOD (ROUTINE X 2)
Culture: NO GROWTH
Culture: NO GROWTH
Special Requests: ADEQUATE
Special Requests: ADEQUATE

## 2019-03-24 ENCOUNTER — Emergency Department (HOSPITAL_COMMUNITY): Payer: PPO

## 2019-03-24 ENCOUNTER — Other Ambulatory Visit: Payer: Self-pay

## 2019-03-24 ENCOUNTER — Encounter (HOSPITAL_COMMUNITY): Payer: Self-pay | Admitting: Emergency Medicine

## 2019-03-24 ENCOUNTER — Inpatient Hospital Stay (HOSPITAL_COMMUNITY)
Admission: EM | Admit: 2019-03-24 | Discharge: 2019-04-09 | DRG: 177 | Disposition: A | Discharge status: Home / Self Care | Payer: PPO | Attending: Internal Medicine | Admitting: Internal Medicine

## 2019-03-24 ENCOUNTER — Telehealth (INDEPENDENT_AMBULATORY_CARE_PROVIDER_SITE_OTHER): Payer: PPO | Admitting: Family Medicine

## 2019-03-24 DIAGNOSIS — J1282 Pneumonia due to coronavirus disease 2019: Secondary | ICD-10-CM | POA: Diagnosis present

## 2019-03-24 DIAGNOSIS — Z8719 Personal history of other diseases of the digestive system: Secondary | ICD-10-CM

## 2019-03-24 DIAGNOSIS — Z9981 Dependence on supplemental oxygen: Secondary | ICD-10-CM

## 2019-03-24 DIAGNOSIS — Z7901 Long term (current) use of anticoagulants: Secondary | ICD-10-CM | POA: Diagnosis not present

## 2019-03-24 DIAGNOSIS — I82461 Acute embolism and thrombosis of right calf muscular vein: Secondary | ICD-10-CM | POA: Diagnosis not present

## 2019-03-24 DIAGNOSIS — R040 Epistaxis: Secondary | ICD-10-CM | POA: Diagnosis not present

## 2019-03-24 DIAGNOSIS — F329 Major depressive disorder, single episode, unspecified: Secondary | ICD-10-CM | POA: Diagnosis present

## 2019-03-24 DIAGNOSIS — J690 Pneumonitis due to inhalation of food and vomit: Secondary | ICD-10-CM | POA: Diagnosis present

## 2019-03-24 DIAGNOSIS — T502X5A Adverse effect of carbonic-anhydrase inhibitors, benzothiadiazides and other diuretics, initial encounter: Secondary | ICD-10-CM | POA: Diagnosis not present

## 2019-03-24 DIAGNOSIS — Z87891 Personal history of nicotine dependence: Secondary | ICD-10-CM

## 2019-03-24 DIAGNOSIS — E871 Hypo-osmolality and hyponatremia: Secondary | ICD-10-CM | POA: Diagnosis present

## 2019-03-24 DIAGNOSIS — R0689 Other abnormalities of breathing: Secondary | ICD-10-CM | POA: Diagnosis not present

## 2019-03-24 DIAGNOSIS — J9600 Acute respiratory failure, unspecified whether with hypoxia or hypercapnia: Secondary | ICD-10-CM

## 2019-03-24 DIAGNOSIS — J1289 Other viral pneumonia: Secondary | ICD-10-CM | POA: Diagnosis not present

## 2019-03-24 DIAGNOSIS — E669 Obesity, unspecified: Secondary | ICD-10-CM | POA: Diagnosis present

## 2019-03-24 DIAGNOSIS — E119 Type 2 diabetes mellitus without complications: Secondary | ICD-10-CM

## 2019-03-24 DIAGNOSIS — R7989 Other specified abnormal findings of blood chemistry: Secondary | ICD-10-CM | POA: Diagnosis not present

## 2019-03-24 DIAGNOSIS — F1011 Alcohol abuse, in remission: Secondary | ICD-10-CM | POA: Diagnosis not present

## 2019-03-24 DIAGNOSIS — F419 Anxiety disorder, unspecified: Secondary | ICD-10-CM | POA: Diagnosis not present

## 2019-03-24 DIAGNOSIS — T380X5A Adverse effect of glucocorticoids and synthetic analogues, initial encounter: Secondary | ICD-10-CM | POA: Diagnosis not present

## 2019-03-24 DIAGNOSIS — U071 COVID-19: Principal | ICD-10-CM | POA: Diagnosis present

## 2019-03-24 DIAGNOSIS — Z8042 Family history of malignant neoplasm of prostate: Secondary | ICD-10-CM | POA: Diagnosis not present

## 2019-03-24 DIAGNOSIS — I82401 Acute embolism and thrombosis of unspecified deep veins of right lower extremity: Secondary | ICD-10-CM | POA: Diagnosis not present

## 2019-03-24 DIAGNOSIS — I10 Essential (primary) hypertension: Secondary | ICD-10-CM | POA: Diagnosis present

## 2019-03-24 DIAGNOSIS — Z6832 Body mass index (BMI) 32.0-32.9, adult: Secondary | ICD-10-CM | POA: Diagnosis not present

## 2019-03-24 DIAGNOSIS — E1165 Type 2 diabetes mellitus with hyperglycemia: Secondary | ICD-10-CM | POA: Diagnosis not present

## 2019-03-24 DIAGNOSIS — Z8249 Family history of ischemic heart disease and other diseases of the circulatory system: Secondary | ICD-10-CM

## 2019-03-24 DIAGNOSIS — Z8349 Family history of other endocrine, nutritional and metabolic diseases: Secondary | ICD-10-CM

## 2019-03-24 DIAGNOSIS — R0609 Other forms of dyspnea: Secondary | ICD-10-CM | POA: Diagnosis not present

## 2019-03-24 DIAGNOSIS — Z832 Family history of diseases of the blood and blood-forming organs and certain disorders involving the immune mechanism: Secondary | ICD-10-CM

## 2019-03-24 DIAGNOSIS — I110 Hypertensive heart disease with heart failure: Secondary | ICD-10-CM | POA: Diagnosis present

## 2019-03-24 DIAGNOSIS — Z79899 Other long term (current) drug therapy: Secondary | ICD-10-CM | POA: Diagnosis not present

## 2019-03-24 DIAGNOSIS — J9601 Acute respiratory failure with hypoxia: Secondary | ICD-10-CM | POA: Diagnosis not present

## 2019-03-24 DIAGNOSIS — I152 Hypertension secondary to endocrine disorders: Secondary | ICD-10-CM | POA: Diagnosis present

## 2019-03-24 DIAGNOSIS — E1169 Type 2 diabetes mellitus with other specified complication: Secondary | ICD-10-CM | POA: Diagnosis present

## 2019-03-24 DIAGNOSIS — Z800 Family history of malignant neoplasm of digestive organs: Secondary | ICD-10-CM

## 2019-03-24 DIAGNOSIS — R0902 Hypoxemia: Secondary | ICD-10-CM | POA: Diagnosis not present

## 2019-03-24 DIAGNOSIS — I824Y3 Acute embolism and thrombosis of unspecified deep veins of proximal lower extremity, bilateral: Secondary | ICD-10-CM | POA: Diagnosis not present

## 2019-03-24 DIAGNOSIS — I5032 Chronic diastolic (congestive) heart failure: Secondary | ICD-10-CM | POA: Diagnosis present

## 2019-03-24 DIAGNOSIS — E785 Hyperlipidemia, unspecified: Secondary | ICD-10-CM | POA: Diagnosis present

## 2019-03-24 DIAGNOSIS — J189 Pneumonia, unspecified organism: Secondary | ICD-10-CM | POA: Diagnosis not present

## 2019-03-24 DIAGNOSIS — Z7952 Long term (current) use of systemic steroids: Secondary | ICD-10-CM | POA: Diagnosis not present

## 2019-03-24 DIAGNOSIS — L400 Psoriasis vulgaris: Secondary | ICD-10-CM | POA: Diagnosis present

## 2019-03-24 DIAGNOSIS — I82442 Acute embolism and thrombosis of left tibial vein: Secondary | ICD-10-CM | POA: Diagnosis not present

## 2019-03-24 DIAGNOSIS — I82402 Acute embolism and thrombosis of unspecified deep veins of left lower extremity: Secondary | ICD-10-CM | POA: Diagnosis not present

## 2019-03-24 DIAGNOSIS — R0602 Shortness of breath: Secondary | ICD-10-CM | POA: Diagnosis not present

## 2019-03-24 LAB — CBC WITH DIFFERENTIAL/PLATELET
Abs Immature Granulocytes: 0 10*3/uL (ref 0.00–0.07)
Basophils Absolute: 0 10*3/uL (ref 0.0–0.1)
Basophils Relative: 0 %
Eosinophils Absolute: 0 10*3/uL (ref 0.0–0.5)
Eosinophils Relative: 0 %
HCT: 44.5 % (ref 39.0–52.0)
Hemoglobin: 16.1 g/dL (ref 13.0–17.0)
Lymphocytes Relative: 6 %
Lymphs Abs: 0.4 10*3/uL — ABNORMAL LOW (ref 0.7–4.0)
MCH: 29.2 pg (ref 26.0–34.0)
MCHC: 36.2 g/dL — ABNORMAL HIGH (ref 30.0–36.0)
MCV: 80.6 fL (ref 80.0–100.0)
Monocytes Absolute: 0.1 10*3/uL (ref 0.1–1.0)
Monocytes Relative: 1 %
Neutro Abs: 6.8 10*3/uL (ref 1.7–7.7)
Neutrophils Relative %: 93 %
Platelets: 172 10*3/uL (ref 150–400)
RBC: 5.52 MIL/uL (ref 4.22–5.81)
RDW: 12.5 % (ref 11.5–15.5)
WBC: 7.3 10*3/uL (ref 4.0–10.5)
nRBC: 0.5 % — ABNORMAL HIGH (ref 0.0–0.2)
nRBC: 2 /100 WBC — ABNORMAL HIGH

## 2019-03-24 LAB — COMPREHENSIVE METABOLIC PANEL
ALT: 25 U/L (ref 0–44)
AST: 52 U/L — ABNORMAL HIGH (ref 15–41)
Albumin: 2.9 g/dL — ABNORMAL LOW (ref 3.5–5.0)
Alkaline Phosphatase: 83 U/L (ref 38–126)
Anion gap: 12 (ref 5–15)
BUN: 12 mg/dL (ref 6–20)
CO2: 21 mmol/L — ABNORMAL LOW (ref 22–32)
Calcium: 8.5 mg/dL — ABNORMAL LOW (ref 8.9–10.3)
Chloride: 97 mmol/L — ABNORMAL LOW (ref 98–111)
Creatinine, Ser: 0.94 mg/dL (ref 0.61–1.24)
GFR calc Af Amer: >60 mL/min (ref >60)
GFR calc non Af Amer: >60 mL/min (ref >60)
Glucose, Bld: 243 mg/dL — ABNORMAL HIGH (ref 70–99)
Potassium: 4.1 mmol/L (ref 3.5–5.1)
Sodium: 130 mmol/L — ABNORMAL LOW (ref 135–145)
Total Bilirubin: 1.6 mg/dL — ABNORMAL HIGH (ref 0.3–1.2)
Total Protein: 6.8 g/dL (ref 6.5–8.1)

## 2019-03-24 LAB — CBG MONITORING, ED: Glucose-Capillary: 275 mg/dL — ABNORMAL HIGH (ref 70–99)

## 2019-03-24 LAB — D-DIMER, QUANTITATIVE: D-Dimer, Quant: >20 ug{FEU}/mL — ABNORMAL HIGH (ref 0.00–0.50)

## 2019-03-24 LAB — BRAIN NATRIURETIC PEPTIDE: B Natriuretic Peptide: 37.2 pg/mL (ref 0.0–100.0)

## 2019-03-24 MED ORDER — INSULIN ASPART 100 UNIT/ML ~~LOC~~ SOLN
0.0000 [IU] | SUBCUTANEOUS | Status: DC
  Administered 2019-03-24 – 2019-03-25 (×2): 5 [IU] via SUBCUTANEOUS
  Administered 2019-03-25: 3 [IU] via SUBCUTANEOUS

## 2019-03-24 MED ORDER — IOHEXOL 350 MG/ML SOLN
75.0000 mL | Freq: Once | INTRAVENOUS | Status: AC | PRN
  Administered 2019-03-24: 75 mL via INTRAVENOUS

## 2019-03-24 MED ORDER — ALBUTEROL (5 MG/ML) CONTINUOUS INHALATION SOLN: 5.0000 mg/h | INHALATION_SOLUTION | RESPIRATORY_TRACT | Status: DC

## 2019-03-24 MED ORDER — DEXAMETHASONE SODIUM PHOSPHATE 10 MG/ML IJ SOLN: 6.0000 mg | INTRAMUSCULAR | Status: DC

## 2019-03-24 MED ORDER — DEXAMETHASONE SODIUM PHOSPHATE 10 MG/ML IJ SOLN
8.0000 mg | Freq: Once | INTRAMUSCULAR | Status: AC
  Administered 2019-03-24: 8 mg via INTRAVENOUS
  Filled 2019-03-24: qty 1

## 2019-03-24 MED ORDER — ALBUTEROL SULFATE HFA 108 (90 BASE) MCG/ACT IN AERS
4.0000 | INHALATION_SPRAY | Freq: Once | RESPIRATORY_TRACT | Status: AC
  Administered 2019-03-24: 4 via RESPIRATORY_TRACT
  Filled 2019-03-24: qty 6.7

## 2019-03-24 NOTE — ED Notes (Signed)
Attempted to contact patient, voicemail box is full.

## 2019-03-24 NOTE — ED Triage Notes (Signed)
Pt arrives to triage in respiratory distress breathing about 25-30 breaths per minute labored respirations room air sats 80%: pt is covid + discharged from [facility] on Saturday.

## 2019-03-24 NOTE — ED Notes (Signed)
Patient wife calling asking for a call back she is worried for patient Cameron Diaz [phone]

## 2019-03-24 NOTE — ED Notes (Signed)
Pt's sat's 83% on 4 LPM [place].  Pt placed on NRB   Dr. Kathrynn Humble in to see pt
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
## 2019-03-24 NOTE — ED Notes (Signed)
Pt returned from ct, saturations remained between 85-89% on NRB, pt placed in prone position. Comfort measures

## 2019-03-24 NOTE — Progress Notes (Signed)
[facility] Telemedicine Visit  Patient consented to have virtual visit. Method of visit: Telephone  Encounter participants: Patient: Cameron Diaz - located at home Provider: Cleophas Dunker - located at [facility] Others (if applicable): wife, Lanelle Bal  Chief Complaint: Hiccups and gasping for air  HPI: [name] is a 58 yo male with PMH HTN, HLD, recent admission for COVID-19, who presents to discuss the following:  Discharged from [facility] on 12/12, patient has not been resting, his feet are swollen.  Patient feels like something is stuck in his throat and "blocking his airway,"  Every once in a while.  Per the wife, it looks like panic attacks.   If she gets him to calm down, he is able to breathe.  At the hospital had the hiccups, still having them daily.  Really bothered by this.    Does not turn blue when this happens.  Patient will scream out and gasp for air.  Does short, quick breaths. Lasts about 10 seconds and then can breathe normal again.  He is doing this all night.  Has some pain in his throat when this occurs.  Wife states, "He looks worse than when he went to the hospital this last time."  ROS: per HPI  Pertinent PMHx: Recent COVID-19 infection, Depression, HTN, HLD  Exam:  Respiratory: Patient had episode of gasping for air while on the phone, lasted about 10 seconds, ended with the sound of hiccup and then was able to breath comfortably again.  Was unable to speak during the episode, but before and afterwards, was speaking in complete sentences without evidence of respiratory distress.  Assessment/Plan:  Difficulty breathing First diagnosed with COVID-19 on 11/30, admitted on 12/8 for acute hypoxic respiratory failure.  Having gasping episodes multiple times throughout the day, one witness while over the phone.  Differential includes viral induced epiglottitis.  Could also consider anxiety, but would want to rule out something  respiratory related before treatment for this.  Discussed with preceptors Dr. McDiarmid and Dr. Kathrynn Speed.  Will send patient to ED for further evaluation, including possible lateral XR to assess for epiglottitis or other obstruction.  Advised patient and his wife of this.  Wife agrees to take patient to ED immediately.    Time spent during visit with patient: 14 minutes

## 2019-03-24 NOTE — Assessment & Plan Note (Signed)
First diagnosed with COVID-19 on 11/30, admitted on 12/8 for acute hypoxic respiratory failure.  Having gasping episodes multiple times throughout the day, one witness while over the phone.  Differential includes viral induced epiglottitis.  Could also consider anxiety, but would want to rule out something respiratory related before treatment for this.  Discussed with preceptors Dr. McDiarmid and Dr. Kathrynn Speed.  Will send patient to ED for further evaluation, including possible lateral XR to assess for epiglottitis or other obstruction.  Advised patient and his wife of this.  Wife agrees to take patient to ED immediately.

## 2019-03-25 ENCOUNTER — Encounter (HOSPITAL_COMMUNITY): Payer: Self-pay | Admitting: Internal Medicine

## 2019-03-25 ENCOUNTER — Other Ambulatory Visit: Payer: Self-pay

## 2019-03-25 DIAGNOSIS — Z79899 Other long term (current) drug therapy: Secondary | ICD-10-CM

## 2019-03-25 DIAGNOSIS — Z8042 Family history of malignant neoplasm of prostate: Secondary | ICD-10-CM | POA: Diagnosis not present

## 2019-03-25 DIAGNOSIS — E785 Hyperlipidemia, unspecified: Secondary | ICD-10-CM | POA: Diagnosis not present

## 2019-03-25 DIAGNOSIS — I82442 Acute embolism and thrombosis of left tibial vein: Secondary | ICD-10-CM | POA: Diagnosis not present

## 2019-03-25 DIAGNOSIS — I82461 Acute embolism and thrombosis of right calf muscular vein: Secondary | ICD-10-CM | POA: Diagnosis not present

## 2019-03-25 DIAGNOSIS — U071 COVID-19: Secondary | ICD-10-CM | POA: Diagnosis not present

## 2019-03-25 DIAGNOSIS — J9601 Acute respiratory failure with hypoxia: Secondary | ICD-10-CM

## 2019-03-25 DIAGNOSIS — J1289 Other viral pneumonia: Secondary | ICD-10-CM | POA: Diagnosis not present

## 2019-03-25 DIAGNOSIS — I10 Essential (primary) hypertension: Secondary | ICD-10-CM

## 2019-03-25 DIAGNOSIS — F1011 Alcohol abuse, in remission: Secondary | ICD-10-CM | POA: Diagnosis not present

## 2019-03-25 DIAGNOSIS — I110 Hypertensive heart disease with heart failure: Secondary | ICD-10-CM | POA: Diagnosis not present

## 2019-03-25 DIAGNOSIS — Z87891 Personal history of nicotine dependence: Secondary | ICD-10-CM | POA: Diagnosis not present

## 2019-03-25 DIAGNOSIS — T380X5A Adverse effect of glucocorticoids and synthetic analogues, initial encounter: Secondary | ICD-10-CM | POA: Diagnosis not present

## 2019-03-25 DIAGNOSIS — Z9981 Dependence on supplemental oxygen: Secondary | ICD-10-CM | POA: Diagnosis not present

## 2019-03-25 DIAGNOSIS — E1165 Type 2 diabetes mellitus with hyperglycemia: Secondary | ICD-10-CM | POA: Diagnosis not present

## 2019-03-25 DIAGNOSIS — E871 Hypo-osmolality and hyponatremia: Secondary | ICD-10-CM | POA: Diagnosis not present

## 2019-03-25 DIAGNOSIS — L400 Psoriasis vulgaris: Secondary | ICD-10-CM | POA: Diagnosis not present

## 2019-03-25 DIAGNOSIS — Z6832 Body mass index (BMI) 32.0-32.9, adult: Secondary | ICD-10-CM | POA: Diagnosis not present

## 2019-03-25 DIAGNOSIS — F419 Anxiety disorder, unspecified: Secondary | ICD-10-CM | POA: Diagnosis not present

## 2019-03-25 DIAGNOSIS — T502X5A Adverse effect of carbonic-anhydrase inhibitors, benzothiadiazides and other diuretics, initial encounter: Secondary | ICD-10-CM | POA: Diagnosis not present

## 2019-03-25 DIAGNOSIS — J690 Pneumonitis due to inhalation of food and vomit: Secondary | ICD-10-CM | POA: Diagnosis not present

## 2019-03-25 DIAGNOSIS — R040 Epistaxis: Secondary | ICD-10-CM | POA: Diagnosis not present

## 2019-03-25 DIAGNOSIS — E119 Type 2 diabetes mellitus without complications: Secondary | ICD-10-CM

## 2019-03-25 DIAGNOSIS — Z7901 Long term (current) use of anticoagulants: Secondary | ICD-10-CM

## 2019-03-25 DIAGNOSIS — E669 Obesity, unspecified: Secondary | ICD-10-CM | POA: Diagnosis not present

## 2019-03-25 DIAGNOSIS — F329 Major depressive disorder, single episode, unspecified: Secondary | ICD-10-CM | POA: Diagnosis not present

## 2019-03-25 DIAGNOSIS — I5032 Chronic diastolic (congestive) heart failure: Secondary | ICD-10-CM | POA: Diagnosis not present

## 2019-03-25 LAB — GLUCOSE, CAPILLARY
Glucose-Capillary: 166 mg/dL — ABNORMAL HIGH (ref 70–99)
Glucose-Capillary: 336 mg/dL — ABNORMAL HIGH (ref 70–99)

## 2019-03-25 LAB — COMPREHENSIVE METABOLIC PANEL
ALT: 28 U/L (ref 0–44)
AST: 35 U/L (ref 15–41)
Albumin: 3 g/dL — ABNORMAL LOW (ref 3.5–5.0)
Alkaline Phosphatase: 78 U/L (ref 38–126)
Anion gap: 11 (ref 5–15)
BUN: 20 mg/dL (ref 6–20)
CO2: 21 mmol/L — ABNORMAL LOW (ref 22–32)
Calcium: 8.8 mg/dL — ABNORMAL LOW (ref 8.9–10.3)
Chloride: 100 mmol/L (ref 98–111)
Creatinine, Ser: 0.82 mg/dL (ref 0.61–1.24)
GFR calc Af Amer: >60 mL/min (ref >60)
GFR calc non Af Amer: >60 mL/min (ref >60)
Glucose, Bld: 274 mg/dL — ABNORMAL HIGH (ref 70–99)
Potassium: 4.3 mmol/L (ref 3.5–5.1)
Sodium: 132 mmol/L — ABNORMAL LOW (ref 135–145)
Total Bilirubin: 1.3 mg/dL — ABNORMAL HIGH (ref 0.3–1.2)
Total Protein: 7.3 g/dL (ref 6.5–8.1)

## 2019-03-25 LAB — CBG MONITORING, ED
Glucose-Capillary: 270 mg/dL — ABNORMAL HIGH (ref 70–99)
Glucose-Capillary: 249 mg/dL — ABNORMAL HIGH (ref 70–99)
Glucose-Capillary: 235 mg/dL — ABNORMAL HIGH (ref 70–99)

## 2019-03-25 LAB — BRAIN NATRIURETIC PEPTIDE: B Natriuretic Peptide: 36 pg/mL (ref 0.0–100.0)

## 2019-03-25 LAB — PROCALCITONIN
Procalcitonin: 1.35 ng/mL
Procalcitonin: 0.79 ng/mL

## 2019-03-25 LAB — LACTATE DEHYDROGENASE: LDH: 613 U/L — ABNORMAL HIGH (ref 98–192)

## 2019-03-25 LAB — TROPONIN I (HIGH SENSITIVITY)
Troponin I (High Sensitivity): 8 ng/L (ref <18)
Troponin I (High Sensitivity): 8 ng/L (ref <18)

## 2019-03-25 LAB — C-REACTIVE PROTEIN
CRP: 23.2 mg/dL — ABNORMAL HIGH (ref <1.0)
CRP: 21.6 mg/dL — ABNORMAL HIGH (ref <1.0)

## 2019-03-25 LAB — HEMOGLOBIN A1C
Hgb A1c MFr Bld: 7.3 % — ABNORMAL HIGH (ref 4.8–5.6)
Mean Plasma Glucose: 162.81 mg/dL

## 2019-03-25 LAB — D-DIMER, QUANTITATIVE: D-Dimer, Quant: >20 ug/mL-FEU — ABNORMAL HIGH (ref 0.00–0.50)

## 2019-03-25 LAB — CBC
HCT: 46 % (ref 39.0–52.0)
Hemoglobin: 16.9 g/dL (ref 13.0–17.0)
MCH: 29.5 pg (ref 26.0–34.0)
MCHC: 36.7 g/dL — ABNORMAL HIGH (ref 30.0–36.0)
MCV: 80.4 fL (ref 80.0–100.0)
Platelets: 186 10*3/uL (ref 150–400)
RBC: 5.72 MIL/uL (ref 4.22–5.81)
RDW: 12.7 % (ref 11.5–15.5)
WBC: 9.1 10*3/uL (ref 4.0–10.5)
nRBC: 0.2 % (ref 0.0–0.2)

## 2019-03-25 LAB — EXPECTORATED SPUTUM ASSESSMENT W GRAM STAIN, RFLX TO RESP C: Special Requests: NORMAL

## 2019-03-25 LAB — SAMPLE TO BLOOD BANK

## 2019-03-25 LAB — FERRITIN
Ferritin: 539 ng/mL — ABNORMAL HIGH (ref 24–336)
Ferritin: 514 ng/mL — ABNORMAL HIGH (ref 24–336)

## 2019-03-25 LAB — FIBRINOGEN: Fibrinogen: 375 mg/dL (ref 210–475)

## 2019-03-25 MED ORDER — ONDANSETRON HCL 4 MG PO TABS
4.0000 mg | ORAL_TABLET | Freq: Four times a day (QID) | ORAL | Status: DC | PRN
  Administered 2019-04-02 – 2019-04-04 (×2): 4 mg via ORAL
  Filled 2019-03-25 (×2): qty 1

## 2019-03-25 MED ORDER — METHYLPREDNISOLONE SODIUM SUCC 125 MG IJ SOLR
0.5000 mg/kg | Freq: Two times a day (BID) | INTRAMUSCULAR | Status: DC
  Administered 2019-03-25: 53.125 mg via INTRAVENOUS
  Filled 2019-03-25: qty 2

## 2019-03-25 MED ORDER — PANTOPRAZOLE SODIUM 40 MG PO TBEC
40.0000 mg | DELAYED_RELEASE_TABLET | Freq: Every day | ORAL | Status: DC
  Administered 2019-03-25 – 2019-04-06 (×13): 40 mg via ORAL
  Filled 2019-03-25 (×13): qty 1

## 2019-03-25 MED ORDER — SODIUM CHLORIDE 0.9 % IV SOLN: 2.0000 g | INTRAVENOUS | Status: DC

## 2019-03-25 MED ORDER — SODIUM CHLORIDE 0.9% FLUSH
3.0000 mL | Freq: Two times a day (BID) | INTRAVENOUS | Status: DC
  Administered 2019-03-25 – 2019-04-08 (×25): 3 mL via INTRAVENOUS

## 2019-03-25 MED ORDER — SODIUM CHLORIDE 0.9% FLUSH
3.0000 mL | INTRAVENOUS | Status: DC | PRN
  Administered 2019-03-25: 3 mL via INTRAVENOUS

## 2019-03-25 MED ORDER — FUROSEMIDE 10 MG/ML IJ SOLN
40.0000 mg | Freq: Once | INTRAMUSCULAR | Status: AC
  Administered 2019-03-25: 40 mg via INTRAVENOUS
  Filled 2019-03-25: qty 4

## 2019-03-25 MED ORDER — HYDROCODONE-ACETAMINOPHEN 5-325 MG PO TABS
1.0000 | ORAL_TABLET | ORAL | Status: DC | PRN
  Administered 2019-03-25: 1 via ORAL
  Filled 2019-03-25: qty 1

## 2019-03-25 MED ORDER — ENOXAPARIN SODIUM 120 MG/0.8ML ~~LOC~~ SOLN
1.0000 mg/kg | Freq: Two times a day (BID) | SUBCUTANEOUS | Status: DC
  Administered 2019-03-25 – 2019-03-30 (×10): 105 mg via SUBCUTANEOUS
  Filled 2019-03-25 (×10): qty 0.8

## 2019-03-25 MED ORDER — HYDROCOD POLST-CPM POLST ER 10-8 MG/5ML PO SUER
5.0000 mL | Freq: Two times a day (BID) | ORAL | Status: DC | PRN
  Administered 2019-03-25 – 2019-04-08 (×14): 5 mL via ORAL
  Filled 2019-03-25 (×14): qty 5

## 2019-03-25 MED ORDER — SODIUM CHLORIDE 0.9% FLUSH
3.0000 mL | Freq: Two times a day (BID) | INTRAVENOUS | Status: DC
  Administered 2019-03-25 – 2019-04-03 (×14): 3 mL via INTRAVENOUS

## 2019-03-25 MED ORDER — CHLORPROMAZINE HCL 25 MG PO TABS
25.0000 mg | ORAL_TABLET | Freq: Four times a day (QID) | ORAL | Status: DC
  Administered 2019-03-25 – 2019-03-26 (×5): 25 mg via ORAL
  Filled 2019-03-25 (×10): qty 1

## 2019-03-25 MED ORDER — INSULIN ASPART 100 UNIT/ML ~~LOC~~ SOLN
0.0000 [IU] | Freq: Three times a day (TID) | SUBCUTANEOUS | Status: DC
  Administered 2019-03-25: 7 [IU] via SUBCUTANEOUS
  Administered 2019-03-26: 9 [IU] via SUBCUTANEOUS

## 2019-03-25 MED ORDER — AMLODIPINE BESYLATE 5 MG PO TABS
5.0000 mg | ORAL_TABLET | Freq: Every day | ORAL | Status: DC
  Administered 2019-03-25 – 2019-03-26 (×2): 5 mg via ORAL
  Filled 2019-03-25 (×2): qty 1

## 2019-03-25 MED ORDER — SODIUM CHLORIDE 0.9 % IV SOLN: 250.0000 mL | INTRAVENOUS | Status: DC | PRN

## 2019-03-25 MED ORDER — SODIUM CHLORIDE 0.9 % IV SOLN: 500.0000 mg | INTRAVENOUS | Status: DC

## 2019-03-25 MED ORDER — LISINOPRIL 20 MG PO TABS
40.0000 mg | ORAL_TABLET | Freq: Every day | ORAL | Status: DC
  Administered 2019-03-25 – 2019-03-31 (×7): 40 mg via ORAL
  Filled 2019-03-25 (×7): qty 2

## 2019-03-25 MED ORDER — ENOXAPARIN SODIUM 60 MG/0.6ML ~~LOC~~ SOLN
0.5000 mg/kg | Freq: Two times a day (BID) | SUBCUTANEOUS | Status: DC
  Administered 2019-03-25: 55 mg via SUBCUTANEOUS
  Filled 2019-03-25 (×2): qty 0.55

## 2019-03-25 MED ORDER — ONDANSETRON HCL 4 MG/2ML IJ SOLN
4.0000 mg | Freq: Four times a day (QID) | INTRAMUSCULAR | Status: DC | PRN
  Filled 2019-03-25: qty 2

## 2019-03-25 MED ORDER — ALBUTEROL SULFATE HFA 108 (90 BASE) MCG/ACT IN AERS
1.0000 | INHALATION_SPRAY | Freq: Four times a day (QID) | RESPIRATORY_TRACT | Status: DC | PRN
  Administered 2019-03-25 – 2019-04-03 (×7): 1 via RESPIRATORY_TRACT
  Filled 2019-03-25: qty 6.7

## 2019-03-25 MED ORDER — CHLORPROMAZINE HCL 25 MG PO TABS: 25.0000 mg | ORAL_TABLET | Freq: Four times a day (QID) | ORAL | Status: DC

## 2019-03-25 MED ORDER — SODIUM CHLORIDE 0.9 % IV SOLN
500.0000 mg | INTRAVENOUS | Status: AC
  Administered 2019-03-25 – 2019-03-26 (×2): 500 mg via INTRAVENOUS
  Filled 2019-03-25 (×2): qty 500

## 2019-03-25 MED ORDER — SODIUM CHLORIDE 0.9 % IV SOLN
2.0000 g | INTRAVENOUS | Status: AC
  Administered 2019-03-25 – 2019-03-28 (×4): 2 g via INTRAVENOUS
  Filled 2019-03-25 (×4): qty 20

## 2019-03-25 MED ORDER — INSULIN GLARGINE 100 UNIT/ML ~~LOC~~ SOLN
5.0000 [IU] | Freq: Every day | SUBCUTANEOUS | Status: DC
  Filled 2019-03-25: qty 0.05

## 2019-03-25 MED ORDER — METHYLPREDNISOLONE SODIUM SUCC 125 MG IJ SOLR
60.0000 mg | Freq: Two times a day (BID) | INTRAMUSCULAR | Status: DC
  Administered 2019-03-25 – 2019-03-31 (×12): 60 mg via INTRAVENOUS
  Filled 2019-03-25 (×12): qty 2

## 2019-03-25 MED ORDER — ENOXAPARIN SODIUM 120 MG/0.8ML ~~LOC~~ SOLN
1.0000 mg/kg | Freq: Two times a day (BID) | SUBCUTANEOUS | Status: DC
  Filled 2019-03-25 (×2): qty 0.8

## 2019-03-25 MED ORDER — ENOXAPARIN SODIUM 120 MG/0.8ML ~~LOC~~ SOLN
1.0000 mg/kg | Freq: Two times a day (BID) | SUBCUTANEOUS | Status: DC
  Filled 2019-03-25: qty 0.8

## 2019-03-25 MED ORDER — ALPRAZOLAM 0.5 MG PO TABS
0.2500 mg | ORAL_TABLET | Freq: Three times a day (TID) | ORAL | Status: DC | PRN
  Administered 2019-03-26: 14:00:00 0.25 mg via ORAL
  Filled 2019-03-25: qty 1

## 2019-03-25 MED ORDER — ACETAMINOPHEN 325 MG PO TABS
650.0000 mg | ORAL_TABLET | Freq: Four times a day (QID) | ORAL | Status: DC | PRN
  Administered 2019-03-28 – 2019-04-08 (×7): 650 mg via ORAL
  Filled 2019-03-25 (×7): qty 2

## 2019-03-25 NOTE — Progress Notes (Signed)
RT responded to Rapid Response as pt stated he felt "something was blocking my windpipe". Pt diminished throughout with crackles in bases. VS within normal limits. Pt with slight increased WOB. Albuterol MDI given to help.

## 2019-03-25 NOTE — ED Notes (Signed)
Additional # to reach pt's wife Cameron Diaz) [phone]

## 2019-03-25 NOTE — ED Notes (Signed)
Attempted to notify pt wife at number listed, voicemail is full.

## 2019-03-25 NOTE — Progress Notes (Signed)
[name]  PROGRESS NOTE    Cameron Diaz  [id number] DOB: Jan 11, 1961 DOA: 03/24/2019 PCP: [name], DO   Brief Narrative:   Presented with   intermittent episode of hypoxia and sensation of something stuck in his throat he has been having intermittent episodes of difficulty breathing and hiccups.  He has been having this episodes daily sometimes at night it really bothers him.  Per wife she does not turn blue during his episodes and they last about 10 seconds or so. Patient was evaluated by family medicine resident and telemetry visit today and was told to present to emergency department  03/25/19: He states he is not improved. Has [place] orders.  Assessment & Plan:   Active Problems:   Essential hypertension   DM (diabetes mellitus), type 2 ([place])   H/O alcohol abuse   Hyperlipidemia   Acute respiratory failure with hypoxia ([place])   Pneumonia due to COVID-19 virus  COVID 19 PNA Acute hypoxic respiratory failure     - recently seen at [facility] for COVID 19; completed remdes course from 12/8 - 12/12; discharged 12/12     - continue steroids for now     - trend inflammatory marker inflammatory markers     - has admission to [place] orders     - PRN antitussives, add PRN albuterol  DM2     - A1c 7.3     - Lantus 5 units qHS, SSI     - follow glucose  HTN     - continue lisinopril, amlodipine     - BP ok  Anxiety?     - per family medicine note, respiratory status significanlt worsens with hiccups and presumed anxiety     - adding protonix; consider adding xanax  DVT prophylaxis: lovenox Code Status: FULL   Disposition Plan: TBD  ROS:  Reports dyspnea. Denies CP, N, V. Remainder 10-pt ROS is negative for all not previously mentioned.  Subjective: "Do I have to do that again?"  Objective: Vitals:   03/25/19 0939 03/25/19 1000 03/25/19 1100 03/25/19 1115  BP: (!) 131/98 (!) 142/95 (!) 138/95   Pulse:  91 100 95  Resp:  (!) 28 (!) 30 (!) 29  Temp:      TempSrc:        SpO2:  95% 92% 93%  Weight:      Height:        Intake/Output Summary (Last 24 hours) at 03/25/2019 1206 Last data filed at 03/24/2019 1935 Gross per 24 hour  Intake --  Output 300 ml  Net -300 ml   Filed Weights   03/24/19 1934  Weight: 105.7 kg    Examination:  General: 58 y.o. male resting in bed in NAD Cardiovascular: tachy, +S1, S2, no m/g/r Respiratory: decreased at bases b/l; left side rhonchi, on NRB GI: BS+, NDNT, no masses noted MSK: No e/c/c Neuro: alert to name, follows commands Psyc: Appropriate interaction and affect, calm/cooperative   Data Reviewed: I have personally reviewed following labs and imaging studies.  CBC: Recent Labs  Lab 03/19/19 0830 03/20/19 0120 03/21/19 0100 03/22/19 0307 03/24/19 1741  WBC 2.8* 5.8 14.2* 11.4* 7.3  NEUTROABS 1.8 4.5 12.4* 9.8* 6.8  HGB 14.7 15.4 16.1 14.5 16.1  HCT 40.0 42.9 45.1 40.6 44.5  MCV 81.8 81.6 82.1 81.0 80.6  PLT 217 282 323 371 [id number]   Basic Metabolic Panel: Recent Labs  Lab 03/19/19 0830 03/20/19 0120 03/21/19 0100 03/22/19 0307 03/24/19 1741  NA 136 137  137 136 130*  K 4.2 4.1 4.1 4.0 4.1  CL 105 105 102 103 97*  CO2 21* 21* 20* 22 21*  GLUCOSE 176* 198* 170* 203* 243*  BUN 12 15 19 20 12   CREATININE 0.73 0.70 0.78 0.76 0.94  CALCIUM 8.4* 9.0 9.1 8.5* 8.5*  MG 1.9 2.0 2.1 2.1  --   PHOS 2.1* 1.8* 2.3* 2.3*  --    GFR: Estimated Creatinine Clearance: 106 mL/min (by C-G formula based on SCr of 0.94 mg/dL). Liver Function Tests: Recent Labs  Lab 03/19/19 0830 03/20/19 0120 03/21/19 0100 03/22/19 0307 03/24/19 1741  AST 35 37 35 28 52*  ALT 22 22 24 23 25   ALKPHOS 46 49 62 65 83  BILITOT 1.0 0.9 1.1 1.0 1.6*  PROT 7.0 7.4 7.8 6.8 6.8  ALBUMIN 3.0* 3.1* 3.5 3.1* 2.9*   No results for input(s): LIPASE, AMYLASE in the last 168 hours. No results for input(s): AMMONIA in the last 168 hours. Coagulation Profile: No results for input(s): INR, PROTIME in the last 168  hours. Cardiac Enzymes: No results for input(s): CKTOTAL, CKMB, CKMBINDEX, TROPONINI in the last 168 hours. BNP (last 3 results) No results for input(s): PROBNP in the last 8760 hours. HbA1C: Recent Labs    03/25/19 0601  HGBA1C 7.3*   CBG: Recent Labs  Lab 03/22/19 1134 03/22/19 1608 03/24/19 2335 03/25/19 0555 03/25/19 0902  GLUCAP 183* 195* 275* 270* 249*   Lipid Profile: No results for input(s): CHOL, HDL, LDLCALC, TRIG, CHOLHDL, LDLDIRECT in the last 72 hours. Thyroid Function Tests: No results for input(s): TSH, T4TOTAL, FREET4, T3FREE, THYROIDAB in the last 72 hours. Anemia Panel: Recent Labs    03/24/19 2329  FERRITIN 539*   Sepsis Labs: Recent Labs  Lab 03/18/19 1934 03/24/19 2329  PROCALCITON <0.10 1.35  LATICACIDVEN 1.5  --     Recent Results (from the past 240 hour(s))  Blood Culture (routine x 2)     Status: None   Collection Time: 03/18/19  7:13 PM   Specimen: BLOOD  Result Value Ref Range Status   Specimen Description BLOOD LEFT ANTECUBITAL  Final   Special Requests   Final    BOTTLES DRAWN AEROBIC AND ANAEROBIC Blood Culture adequate volume   Culture   Final    NO GROWTH 5 DAYS Performed at [facility], [employer] [address], [place], [place] [address]    Report Status 03/23/2019 FINAL  Final  Blood Culture (routine x 2)     Status: None   Collection Time: 03/18/19  7:35 PM   Specimen: BLOOD  Result Value Ref Range Status   Specimen Description BLOOD RIGHT ANTECUBITAL  Final   Special Requests   Final    BOTTLES DRAWN AEROBIC AND ANAEROBIC Blood Culture adequate volume   Culture   Final    NO GROWTH 5 DAYS Performed at [facility], [place] [address], [place], [place] [address]    Report Status 03/23/2019 FINAL  Final      Radiology Studies: CT Angio Chest PE W and/or Wo Contrast  Result Date: 03/24/2019 CLINICAL DATA:  COVID-19 positive.  Shortness of breath. EXAM: CT ANGIOGRAPHY CHEST WITH CONTRAST TECHNIQUE: Multidetector  CT imaging of the chest was performed using the standard protocol during bolus administration of intravenous contrast. Multiplanar CT image reconstructions and MIPs were obtained to evaluate the vascular anatomy. CONTRAST:  12mL OMNIPAQUE IOHEXOL 350 MG/ML SOLN COMPARISON:  Multiple to prior chest radiographs, including earlier today. FINDINGS: Cardiovascular:  The quality of this exam for evaluation of pulmonary embolism is poor, primarily secondary to poor bolus timing. No saddle/central pulmonary embolism identified. Smaller emboli, especially within the medial right lower lobe (example 81/5) cannot be excluded. Normal aortic caliber. Mild cardiomegaly, without pericardial effusion. Pulmonary artery enlargement, outflow tract 3.1 cm. Lad coronary artery calcification. Mediastinum/Nodes: No mediastinal or hilar adenopathy. Tiny hiatal hernia. Lungs/Pleura: No pleural fluid. Peripheral and slightly basilar predominant ground-glass and airspace opacities. Mild sparing of the apices. Upper Abdomen: Cirrhosis. Normal imaged portions of the spleen, pancreas, gallbladder, adrenal glands, kidneys. Motion degradation involving the upper abdomen. Musculoskeletal: Lower thoracic spondylosis. Review of the MIP images confirms the above findings. IMPRESSION: 1. Poor evaluation for pulmonary embolism, primarily secondary to suboptimal bolus timing. No central pulmonary embolism identified. Segmental to subsegmental emboli, especially in the right lower lobe cannot be excluded. If ongoing clinical concern, recommend repeat CTA when patient is clinically stable. 2. Moderate to severe ground-glass and airspace disease, consistent with COVID-19 pneumonia. 3. Coronary artery atherosclerosis. 4. Cirrhosis. Electronically Signed   By: Abigail Miyamoto M.D.   On: 03/24/2019 20:46   DG Chest Port 1 View  Result Date: 03/24/2019 CLINICAL DATA:  Shortness of breath. Diagnosed with COVID-19 on 03/10/2019. EXAM: PORTABLE CHEST 1 VIEW  COMPARISON:  03/18/2019. FINDINGS: The cardiac silhouette remains borderline enlarged. Progressive diffuse interstitial prominence in the left mid and lower lung zones. No significant change in milder interstitial prominence elsewhere in both lungs. Mild increase in patchy density at the left lung base. Mild right basilar atelectasis. Thoracic spine degenerative changes. IMPRESSION: 1. Mild worsening of interstitial pneumonitis in the left mid and lower lung zones with mildly increased airspace opacity the left lung base, compatible with the clinical diagnosis of COVID-19. 2. No significant change in mild interstitial pneumonitis/chronic interstitial lung disease elsewhere in both lungs. 3. Mild right basilar atelectasis. Electronically Signed   By: Claudie Revering M.D.   On: 03/24/2019 18:44     Scheduled Meds: . amLODipine  5 mg Oral Daily  . enoxaparin (LOVENOX) injection  0.5 mg/kg Subcutaneous Q12H  . insulin aspart  0-9 Units Subcutaneous Q4H  . lisinopril  40 mg Oral Daily  . methylPREDNISolone (SOLU-MEDROL) injection  0.5 mg/kg Intravenous Q12H  . sodium chloride flush  3 mL Intravenous Q12H  . sodium chloride flush  3 mL Intravenous Q12H   Continuous Infusions: . sodium chloride       LOS: 1 day    Time spent: 25 minutes spent in the coordination of care today.    Jonnie Finner, DO [facility] Pager [phone]  If 7PM-7AM, please contact night-coverage [email or web address] Password [email or web address] 03/25/2019, 12:06 PM

## 2019-03-25 NOTE — ED Notes (Signed)
Updated pt's wife on status and plan of care

## 2019-03-25 NOTE — ED Notes (Signed)
carelink at bedside; [facility] notified that pt is on the way

## 2019-03-25 NOTE — ED Notes (Signed)
Armanda Heritage, RN, attempted to return pt's wife's phone call - no answer
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
## 2019-03-25 NOTE — ED Notes (Signed)
Called report to [place], Therapist, sports at [facility]

## 2019-03-26 ENCOUNTER — Inpatient Hospital Stay (HOSPITAL_COMMUNITY): Payer: PPO

## 2019-03-26 DIAGNOSIS — F1011 Alcohol abuse, in remission: Secondary | ICD-10-CM

## 2019-03-26 DIAGNOSIS — J9601 Acute respiratory failure with hypoxia: Secondary | ICD-10-CM

## 2019-03-26 DIAGNOSIS — R7989 Other specified abnormal findings of blood chemistry: Secondary | ICD-10-CM

## 2019-03-26 DIAGNOSIS — U071 COVID-19: Secondary | ICD-10-CM

## 2019-03-26 DIAGNOSIS — E785 Hyperlipidemia, unspecified: Secondary | ICD-10-CM

## 2019-03-26 DIAGNOSIS — Z7952 Long term (current) use of systemic steroids: Secondary | ICD-10-CM

## 2019-03-26 LAB — COMPREHENSIVE METABOLIC PANEL
ALT: 25 U/L (ref 0–44)
AST: 24 U/L (ref 15–41)
Albumin: 2.6 g/dL — ABNORMAL LOW (ref 3.5–5.0)
Alkaline Phosphatase: 66 U/L (ref 38–126)
Anion gap: 10 (ref 5–15)
BUN: 24 mg/dL — ABNORMAL HIGH (ref 6–20)
CO2: 24 mmol/L (ref 22–32)
Calcium: 8.5 mg/dL — ABNORMAL LOW (ref 8.9–10.3)
Chloride: 100 mmol/L (ref 98–111)
Creatinine, Ser: 0.85 mg/dL (ref 0.61–1.24)
GFR calc Af Amer: >60 mL/min (ref >60)
GFR calc non Af Amer: >60 mL/min (ref >60)
Glucose, Bld: 355 mg/dL — ABNORMAL HIGH (ref 70–99)
Potassium: 4.3 mmol/L (ref 3.5–5.1)
Sodium: 134 mmol/L — ABNORMAL LOW (ref 135–145)
Total Bilirubin: 1 mg/dL (ref 0.3–1.2)
Total Protein: 6.5 g/dL (ref 6.5–8.1)

## 2019-03-26 LAB — CBC
HCT: 39.7 % (ref 39.0–52.0)
Hemoglobin: 14.5 g/dL (ref 13.0–17.0)
MCH: 29.1 pg (ref 26.0–34.0)
MCHC: 36.5 g/dL — ABNORMAL HIGH (ref 30.0–36.0)
MCV: 79.7 fL — ABNORMAL LOW (ref 80.0–100.0)
Platelets: 168 10*3/uL (ref 150–400)
RBC: 4.98 MIL/uL (ref 4.22–5.81)
RDW: 12.5 % (ref 11.5–15.5)
WBC: 9.3 10*3/uL (ref 4.0–10.5)
nRBC: 0 % (ref 0.0–0.2)

## 2019-03-26 LAB — GLUCOSE, CAPILLARY
Glucose-Capillary: 361 mg/dL — ABNORMAL HIGH (ref 70–99)
Glucose-Capillary: 352 mg/dL — ABNORMAL HIGH (ref 70–99)
Glucose-Capillary: 361 mg/dL — ABNORMAL HIGH (ref 70–99)
Glucose-Capillary: 380 mg/dL — ABNORMAL HIGH (ref 70–99)
Glucose-Capillary: 367 mg/dL — ABNORMAL HIGH (ref 70–99)

## 2019-03-26 LAB — C-REACTIVE PROTEIN: CRP: 13.8 mg/dL — ABNORMAL HIGH (ref <1.0)

## 2019-03-26 LAB — D-DIMER, QUANTITATIVE: D-Dimer, Quant: >20 ug/mL-FEU — ABNORMAL HIGH (ref 0.00–0.50)

## 2019-03-26 MED ORDER — INSULIN ASPART 100 UNIT/ML ~~LOC~~ SOLN
0.0000 [IU] | Freq: Three times a day (TID) | SUBCUTANEOUS | Status: DC
  Administered 2019-03-26 – 2019-03-27 (×2): 20 [IU] via SUBCUTANEOUS
  Administered 2019-03-27: 15 [IU] via SUBCUTANEOUS
  Administered 2019-03-27: 7 [IU] via SUBCUTANEOUS
  Administered 2019-03-28: 4 [IU] via SUBCUTANEOUS
  Administered 2019-03-28: 15 [IU] via SUBCUTANEOUS
  Administered 2019-03-28: 20 [IU] via SUBCUTANEOUS
  Administered 2019-03-29: 7 [IU] via SUBCUTANEOUS
  Administered 2019-03-29: 15 [IU] via SUBCUTANEOUS
  Administered 2019-03-30: 11 [IU] via SUBCUTANEOUS
  Administered 2019-03-30 – 2019-03-31 (×4): 4 [IU] via SUBCUTANEOUS
  Administered 2019-03-31: 11 [IU] via SUBCUTANEOUS
  Administered 2019-04-01: 3 [IU] via SUBCUTANEOUS
  Administered 2019-04-01 – 2019-04-02 (×2): 15 [IU] via SUBCUTANEOUS
  Administered 2019-04-02: 7 [IU] via SUBCUTANEOUS
  Administered 2019-04-03: 11 [IU] via SUBCUTANEOUS
  Administered 2019-04-03: 15 [IU] via SUBCUTANEOUS
  Administered 2019-04-04: 20 [IU] via SUBCUTANEOUS
  Administered 2019-04-04: 11 [IU] via SUBCUTANEOUS
  Administered 2019-04-05: 4 [IU] via SUBCUTANEOUS
  Administered 2019-04-05: 11 [IU] via SUBCUTANEOUS
  Administered 2019-04-05: 7 [IU] via SUBCUTANEOUS
  Administered 2019-04-06: 15 [IU] via SUBCUTANEOUS
  Administered 2019-04-07: 11 [IU] via SUBCUTANEOUS
  Administered 2019-04-07: 4 [IU] via SUBCUTANEOUS
  Administered 2019-04-07: 3 [IU] via SUBCUTANEOUS
  Administered 2019-04-08 (×2): 11 [IU] via SUBCUTANEOUS
  Administered 2019-04-09: 4 [IU] via SUBCUTANEOUS

## 2019-03-26 MED ORDER — INSULIN DETEMIR 100 UNIT/ML ~~LOC~~ SOLN
10.0000 [IU] | Freq: Every day | SUBCUTANEOUS | Status: DC
  Administered 2019-03-26: 10 [IU] via SUBCUTANEOUS
  Filled 2019-03-26: qty 0.1

## 2019-03-26 MED ORDER — INSULIN ASPART 100 UNIT/ML ~~LOC~~ SOLN
0.0000 [IU] | Freq: Three times a day (TID) | SUBCUTANEOUS | Status: DC
  Administered 2019-03-26: 15 [IU] via SUBCUTANEOUS

## 2019-03-26 MED ORDER — INSULIN ASPART 100 UNIT/ML ~~LOC~~ SOLN
0.0000 [IU] | Freq: Every day | SUBCUTANEOUS | Status: DC
  Administered 2019-03-26: 5 [IU] via SUBCUTANEOUS
  Administered 2019-03-27 – 2019-03-28 (×2): 3 [IU] via SUBCUTANEOUS
  Administered 2019-03-29: 5 [IU] via SUBCUTANEOUS
  Administered 2019-03-30 – 2019-04-01 (×2): 2 [IU] via SUBCUTANEOUS
  Administered 2019-04-03: 4 [IU] via SUBCUTANEOUS
  Administered 2019-04-05 – 2019-04-06 (×2): 3 [IU] via SUBCUTANEOUS
  Administered 2019-04-07: 4 [IU] via SUBCUTANEOUS

## 2019-03-26 MED ORDER — INSULIN DETEMIR 100 UNIT/ML ~~LOC~~ SOLN
10.0000 [IU] | Freq: Two times a day (BID) | SUBCUTANEOUS | Status: DC
  Administered 2019-03-26: 23:00:00 10 [IU] via SUBCUTANEOUS
  Filled 2019-03-26 (×2): qty 0.1

## 2019-03-26 MED ORDER — INSULIN ASPART 100 UNIT/ML ~~LOC~~ SOLN: 0.0000 [IU] | Freq: Every day | SUBCUTANEOUS | Status: DC

## 2019-03-26 MED ORDER — INSULIN ASPART 100 UNIT/ML ~~LOC~~ SOLN
5.0000 [IU] | Freq: Three times a day (TID) | SUBCUTANEOUS | Status: DC
  Administered 2019-03-26: 5 [IU] via SUBCUTANEOUS

## 2019-03-26 MED ORDER — AMLODIPINE BESYLATE 10 MG PO TABS
10.0000 mg | ORAL_TABLET | Freq: Every day | ORAL | Status: DC
  Administered 2019-03-27 – 2019-04-03 (×8): 10 mg via ORAL
  Filled 2019-03-26 (×8): qty 1

## 2019-03-26 NOTE — Progress Notes (Addendum)
Met with patient on PM rounds. Currently remains short of breath mostly described as frustrated, still hiccups, but eating. Took of NRB to eat, just on 15L HFNC and SpO2 88%, stable, tachypneic but no more so than before. Denies chest pain.   I informed him of acute bilateral DVT and need for ongoing anticoagulation. Hoping to continue treatment in PCU with maximal oxygen support given his stable clinical appearance. Will continue with the typical permissive hypoxia for covid patients, but recommend low threshold for transfer to ICU for HHF if hypoxia worsens or respiratory distress develops.   Vance Gather, MD 03/26/2019 6:33 PM
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
## 2019-03-26 NOTE — Progress Notes (Signed)
Bilateral lower extremity venous duplex has been completed. Preliminary results can be found in CV Proc through chart review.  Results were given to the patient's nurse, Rosemarie Ax.  03/26/19 9:50 AM Carlos Levering RVT

## 2019-03-26 NOTE — Plan of Care (Signed)
  Problem: Education: Goal: Knowledge of risk factors and measures for prevention of condition will improve Outcome: Progressing   Problem: Coping: Goal: Psychosocial and spiritual needs will be supported Outcome: Progressing   Problem: Respiratory: Goal: Will maintain a patent airway Outcome: Progressing Goal: Complications related to the disease process, condition or treatment will be avoided or minimized Outcome: Progressing   
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
## 2019-03-26 NOTE — Progress Notes (Signed)
Inpatient Diabetes Program Recommendations  AACE/ADA: New Consensus Statement on Inpatient Glycemic Control (2015)  Target Ranges:  Prepandial:   less than 140 mg/dL      Peak postprandial:   less than 180 mg/dL (1-2 hours)      Critically ill patients:  140 - 180 mg/dL   Lab Results  Component Value Date   GLUCAP 361 (H) 03/26/2019   HGBA1C 7.3 (H) 03/25/2019    Review of Glycemic Control Results for ANDREUS, MEIGHEN (MRN [id number]) as of 03/26/2019 11:20  Ref. Range 03/25/2019 05:55 03/25/2019 09:02 03/25/2019 12:17 03/25/2019 15:59 03/26/2019 07:35  Glucose-Capillary Latest Ref Range: 70 - 99 mg/dL 270 (H) 249 (H) 235 (H) 336 (H) 361 (H)  Diabetes history: DM2 Outpatient Diabetes medications: Metformin 500 mg QD Current orders for Inpatient glycemic control: Novolog moderate tid w/ meals and HS, Levemir 10 units daily Solumedrol 60 mg Iv q 12 hours  Inpatient Diabetes Program Recommendations:   Please consider increasing Levemir to 10 units bid.  Also please consider adding Novolog meal coverage 5 units tid with meals (while on steroids).   Thanks  Adah Perl, RN, BC-ADM Inpatient Diabetes Coordinator Pager [phone] (8a-5p)

## 2019-03-27 DIAGNOSIS — I82401 Acute embolism and thrombosis of unspecified deep veins of right lower extremity: Secondary | ICD-10-CM

## 2019-03-27 DIAGNOSIS — I82402 Acute embolism and thrombosis of unspecified deep veins of left lower extremity: Secondary | ICD-10-CM

## 2019-03-27 LAB — COMPREHENSIVE METABOLIC PANEL
ALT: 25 U/L (ref 0–44)
AST: 21 U/L (ref 15–41)
Albumin: 2.5 g/dL — ABNORMAL LOW (ref 3.5–5.0)
Alkaline Phosphatase: 76 U/L (ref 38–126)
Anion gap: 13 (ref 5–15)
BUN: 28 mg/dL — ABNORMAL HIGH (ref 6–20)
CO2: 23 mmol/L (ref 22–32)
Calcium: 8.5 mg/dL — ABNORMAL LOW (ref 8.9–10.3)
Chloride: 99 mmol/L (ref 98–111)
Creatinine, Ser: 0.88 mg/dL (ref 0.61–1.24)
GFR calc Af Amer: >60 mL/min (ref >60)
GFR calc non Af Amer: >60 mL/min (ref >60)
Glucose, Bld: 277 mg/dL — ABNORMAL HIGH (ref 70–99)
Potassium: 4.2 mmol/L (ref 3.5–5.1)
Sodium: 135 mmol/L (ref 135–145)
Total Bilirubin: 0.9 mg/dL (ref 0.3–1.2)
Total Protein: 6.3 g/dL — ABNORMAL LOW (ref 6.5–8.1)

## 2019-03-27 LAB — CBC
HCT: 37.8 % — ABNORMAL LOW (ref 39.0–52.0)
Hemoglobin: 13.7 g/dL (ref 13.0–17.0)
MCH: 29 pg (ref 26.0–34.0)
MCHC: 36.2 g/dL — ABNORMAL HIGH (ref 30.0–36.0)
MCV: 80.1 fL (ref 80.0–100.0)
Platelets: 164 10*3/uL (ref 150–400)
RBC: 4.72 MIL/uL (ref 4.22–5.81)
RDW: 12.4 % (ref 11.5–15.5)
WBC: 11 10*3/uL — ABNORMAL HIGH (ref 4.0–10.5)
nRBC: 0 % (ref 0.0–0.2)

## 2019-03-27 LAB — GLUCOSE, CAPILLARY
Glucose-Capillary: 233 mg/dL — ABNORMAL HIGH (ref 70–99)
Glucose-Capillary: 258 mg/dL — ABNORMAL HIGH (ref 70–99)
Glucose-Capillary: 288 mg/dL — ABNORMAL HIGH (ref 70–99)
Glucose-Capillary: 324 mg/dL — ABNORMAL HIGH (ref 70–99)
Glucose-Capillary: 361 mg/dL — ABNORMAL HIGH (ref 70–99)
Glucose-Capillary: 372 mg/dL — ABNORMAL HIGH (ref 70–99)
Glucose-Capillary: 387 mg/dL — ABNORMAL HIGH (ref 70–99)

## 2019-03-27 LAB — CULTURE, RESPIRATORY W GRAM STAIN
Culture: NORMAL
Special Requests: NORMAL

## 2019-03-27 LAB — C-REACTIVE PROTEIN: CRP: 6.5 mg/dL — ABNORMAL HIGH (ref <1.0)

## 2019-03-27 LAB — D-DIMER, QUANTITATIVE: D-Dimer, Quant: 5.29 ug/mL-FEU — ABNORMAL HIGH (ref 0.00–0.50)

## 2019-03-27 MED ORDER — INSULIN ASPART 100 UNIT/ML ~~LOC~~ SOLN
6.0000 [IU] | Freq: Three times a day (TID) | SUBCUTANEOUS | Status: DC
  Administered 2019-03-27 – 2019-04-02 (×19): 6 [IU] via SUBCUTANEOUS

## 2019-03-27 MED ORDER — CHLORPROMAZINE HCL 25 MG PO TABS
50.0000 mg | ORAL_TABLET | Freq: Four times a day (QID) | ORAL | Status: DC
  Filled 2019-03-27 (×2): qty 1

## 2019-03-27 MED ORDER — PHENOL 1.4 % MT LIQD
1.0000 | OROMUCOSAL | Status: DC | PRN
  Administered 2019-03-27 – 2019-04-03 (×2): 1 via OROMUCOSAL
  Filled 2019-03-27: qty 177

## 2019-03-27 MED ORDER — ALPRAZOLAM 0.25 MG PO TABS
0.2500 mg | ORAL_TABLET | Freq: Two times a day (BID) | ORAL | Status: DC
  Administered 2019-03-27 – 2019-04-09 (×27): 0.25 mg via ORAL
  Filled 2019-03-27 (×27): qty 1

## 2019-03-27 MED ORDER — INSULIN DETEMIR 100 UNIT/ML ~~LOC~~ SOLN
15.0000 [IU] | Freq: Two times a day (BID) | SUBCUTANEOUS | Status: DC
  Administered 2019-03-27 – 2019-04-01 (×11): 15 [IU] via SUBCUTANEOUS
  Filled 2019-03-27 (×12): qty 0.15

## 2019-03-27 MED ORDER — CHLORPROMAZINE HCL 25 MG PO TABS
25.0000 mg | ORAL_TABLET | Freq: Four times a day (QID) | ORAL | Status: AC
  Administered 2019-03-27 – 2019-03-29 (×10): 25 mg via ORAL
  Filled 2019-03-27 (×12): qty 1

## 2019-03-27 NOTE — Plan of Care (Signed)
Pt remained on HFNC and NRB all shift. Complaints of dyspnea on exertion and chest pain when coughing and hiccuping. Pt originally refused 0600 Lovenox, but eventually agreed to receive it with 0800 medications. Wife was updated on the phone and all questions were answered.   Problem: Education: Goal: Knowledge of risk factors and measures for prevention of condition will improve Outcome: Progressing   Problem: Coping: Goal: Psychosocial and spiritual needs will be supported Outcome: Progressing   Problem: Respiratory: Goal: Will maintain a patent airway Outcome: Progressing Goal: Complications related to the disease process, condition or treatment will be avoided or minimized Outcome: Progressing
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
## 2019-03-28 LAB — GLUCOSE, CAPILLARY
Glucose-Capillary: 151 mg/dL — ABNORMAL HIGH (ref 70–99)
Glucose-Capillary: 304 mg/dL — ABNORMAL HIGH (ref 70–99)
Glucose-Capillary: 357 mg/dL — ABNORMAL HIGH (ref 70–99)
Glucose-Capillary: 268 mg/dL — ABNORMAL HIGH (ref 70–99)

## 2019-03-28 LAB — COMPREHENSIVE METABOLIC PANEL
ALT: 28 U/L (ref 0–44)
AST: 21 U/L (ref 15–41)
Albumin: 2.7 g/dL — ABNORMAL LOW (ref 3.5–5.0)
Alkaline Phosphatase: 83 U/L (ref 38–126)
Anion gap: 9 (ref 5–15)
BUN: 23 mg/dL — ABNORMAL HIGH (ref 6–20)
CO2: 24 mmol/L (ref 22–32)
Calcium: 8.8 mg/dL — ABNORMAL LOW (ref 8.9–10.3)
Chloride: 101 mmol/L (ref 98–111)
Creatinine, Ser: 0.77 mg/dL (ref 0.61–1.24)
GFR calc Af Amer: >60 mL/min (ref >60)
GFR calc non Af Amer: >60 mL/min (ref >60)
Glucose, Bld: 150 mg/dL — ABNORMAL HIGH (ref 70–99)
Potassium: 4.3 mmol/L (ref 3.5–5.1)
Sodium: 134 mmol/L — ABNORMAL LOW (ref 135–145)
Total Bilirubin: 1 mg/dL (ref 0.3–1.2)
Total Protein: 6.6 g/dL (ref 6.5–8.1)

## 2019-03-28 LAB — CBC
HCT: 39.1 % (ref 39.0–52.0)
Hemoglobin: 14.1 g/dL (ref 13.0–17.0)
MCH: 28.8 pg (ref 26.0–34.0)
MCHC: 36.1 g/dL — ABNORMAL HIGH (ref 30.0–36.0)
MCV: 80 fL (ref 80.0–100.0)
Platelets: 190 10*3/uL (ref 150–400)
RBC: 4.89 MIL/uL (ref 4.22–5.81)
RDW: 12.5 % (ref 11.5–15.5)
WBC: 14.1 10*3/uL — ABNORMAL HIGH (ref 4.0–10.5)
nRBC: 0 % (ref 0.0–0.2)

## 2019-03-28 LAB — C-REACTIVE PROTEIN: CRP: 3.6 mg/dL — ABNORMAL HIGH (ref <1.0)

## 2019-03-28 LAB — D-DIMER, QUANTITATIVE: D-Dimer, Quant: 2.98 ug/mL-FEU — ABNORMAL HIGH (ref 0.00–0.50)
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
## 2019-03-28 NOTE — TOC Initial Note (Signed)
Transition of Care [facility]) - Initial/Assessment Note    Patient Details  Name: Cameron Diaz MRN: [id number] Date of Birth: 1960-04-13  Transition of Care [facility]) CM/SW Contact:    Ninfa Meeker, RN Phone Number: 03/28/2019, 3:17 PM  Clinical Narrative:   Patient is a 58 yr old gentleman that was recently patient at [facility], 12/8-12/12/20. Was discharged home on oxygen, 2L [place]. Presented to ER with Respiratory distress, placed on oxygen. Patient found to have bilateral DVT's . Admitted and transferred to [facility] for further treatment of acute respiratory failure due to COVID 19 pneumonia. Patient is on Gay, IV antibiotics and steroids.          Patient Goals and CMS Choice        Expected Discharge Plan and Services                                                Prior Living Arrangements/Services                       Activities of Daily Living Home Assistive Devices/Equipment: None ADL Screening (condition at time of admission) Patient's cognitive ability adequate to safely complete daily activities?: Yes Is the patient deaf or have difficulty hearing?: No Does the patient have difficulty seeing, even when wearing glasses/contacts?: No Does the patient have difficulty concentrating, remembering, or making decisions?: No Patient able to express need for assistance with ADLs?: No Does the patient have difficulty dressing or bathing?: No Independently performs ADLs?: Yes (appropriate for developmental age) Communication: Independent Dressing (OT): Independent Grooming: Independent Feeding: Independent Bathing: Independent Toileting: Independent In/Out Bed: Independent Walks in Home: Independent Does the patient have difficulty walking or climbing stairs?: No Weakness of Legs: None Weakness of Arms/Hands: None  Permission Sought/Granted                  Emotional Assessment              Admission diagnosis:  Hypoxemia [R09.02] Acute  respiratory failure due to COVID-19 ([place]) [U07.1, J96.00] Pneumonia due to COVID-19 virus [U07.1, J12.89] Patient Active Problem List   Diagnosis Date Noted  . Difficulty breathing 03/24/2019  . Acute respiratory failure with hypoxia ([place]) 03/18/2019  . Pneumonia due to COVID-19 virus 03/18/2019  . Foot pain, right 11/21/2018  . Healthcare maintenance 11/21/2018  . 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 Myalgia 11/08/2017  . Atypical chest pain 10/30/2017  . Trochanteric bursitis of right hip 10/30/2017  . Umbilical hernia without obstruction and without gangrene 10/30/2017  . Knee pain, right 10/30/2017  . Family history of colon cancer in father 09/21/2017  . Family history of pancreatic cancer - brother and grandfather 09/21/2017  . Bilateral leg edema 02/28/2017  . Abdominal pain 05/08/2016  . Erectile dysfunction 02/09/2016  . Neck pain 12/21/2015  . Depression 02/20/2015  . Foot callus 09/08/2014  . Hyperlipidemia 08/26/2014  . Essential hypertension 08/24/2014  . DM (diabetes mellitus), type 2 ([place]) 08/24/2014  . Psoriasis 08/24/2014  . Constipation 08/24/2014  . Insomnia 08/24/2014  . Chronic pain 08/24/2014  . H/O alcohol abuse 08/24/2014  . H/O drug abuse ([place]) 08/24/2014  . History of colonic polyps numerous and large hyperplastics 09/11/2011   PCP:  [name], DO Pharmacy:   [facility] ([place]), [place] - [address] [place]) [place] [address] Phone: [phone] Fax: [phone]  [id number] - [place], [place] [id number] [place] [id number] Phone: [phone] Fax: [phone]  [id number] - [place], [place] - [address] [place] [address] Phone: [phone] Fax: [phone]     Social Determinants of Health (SDOH) Interventions     Readmission Risk Interventions No flowsheet data found.

## 2019-03-28 NOTE — Progress Notes (Addendum)
Inpatient Diabetes Program Recommendations  AACE/ADA: New Consensus Statement on Inpatient Glycemic Control (2015)  Target Ranges:  Prepandial:   less than 140 mg/dL      Peak postprandial:   less than 180 mg/dL (1-2 hours)      Critically ill patients:  140 - 180 mg/dL   Lab Results  Component Value Date   GLUCAP 304 (H) 03/28/2019   HGBA1C 7.3 (H) 03/25/2019    Review of Glycemic Control Results for Cameron Diaz, Cameron Diaz (MRN [id number]) as of 03/28/2019 14:01  Ref. Range 03/27/2019 16:23 03/27/2019 20:34 03/28/2019 07:48 03/28/2019 11:52  Glucose-Capillary Latest Ref Range: 70 - 99 mg/dL 361 (H) 258 (H) 151 (H) 304 (H)   Diabetes history:DM2 Outpatient Diabetes medications:Metformin 500 mg QD Current orders for Inpatient glycemic control:Novolog resistant tid w/ meals and HS, Levemir 15 units BID, Novolog 6 units TID Solumedrol 60 mg Iv q 12 hours  Inpatient Diabetes Program Recommendations:   Consider increasing Novolog meal coverage 10 units tid with meals (while on steroids).   Thanks   Bronson Curb, MSN, RNC-OB Diabetes Coordinator [phone] (8a-5p)

## 2019-03-28 NOTE — Plan of Care (Signed)
[name]. VSS, SpO2 > 88% on 8LHFNC. No c/o pain throughout shift. Utilizing urinal at bedside independently.   Will continue to monitor & report to oncoming shift  [name]    Problem: Education: Goal: Knowledge of risk factors and measures for prevention of condition will improve Outcome: Progressing   Problem: Coping: Goal: Psychosocial and spiritual needs will be supported Outcome: Progressing   Problem: Respiratory: Goal: Will maintain a patent airway Outcome: Progressing Goal: Complications related to the disease process, condition or treatment will be avoided or minimized Outcome: Progressing

## 2019-03-28 NOTE — Progress Notes (Signed)
1900 shift change pt HR 155 RR >40 O2 76. Pt has taken off NRB and having anxiety attack. NRB replaced. Deep breathing exercises initiated. Pt HR 110. RR >20s O2 94.

## 2019-03-28 NOTE — Progress Notes (Signed)
RN spoke with wife on the phone. Updates given, no further questions

## 2019-03-29 ENCOUNTER — Other Ambulatory Visit: Payer: Self-pay

## 2019-03-29 ENCOUNTER — Inpatient Hospital Stay (HOSPITAL_COMMUNITY): Payer: PPO

## 2019-03-29 DIAGNOSIS — R0609 Other forms of dyspnea: Secondary | ICD-10-CM

## 2019-03-29 LAB — COMPREHENSIVE METABOLIC PANEL
ALT: 25 U/L (ref 0–44)
AST: 19 U/L (ref 15–41)
Albumin: 2.6 g/dL — ABNORMAL LOW (ref 3.5–5.0)
Alkaline Phosphatase: 74 U/L (ref 38–126)
Anion gap: 10 (ref 5–15)
BUN: 24 mg/dL — ABNORMAL HIGH (ref 6–20)
CO2: 25 mmol/L (ref 22–32)
Calcium: 8.7 mg/dL — ABNORMAL LOW (ref 8.9–10.3)
Chloride: 101 mmol/L (ref 98–111)
Creatinine, Ser: 0.73 mg/dL (ref 0.61–1.24)
GFR calc Af Amer: >60 mL/min (ref >60)
GFR calc non Af Amer: >60 mL/min (ref >60)
Glucose, Bld: 135 mg/dL — ABNORMAL HIGH (ref 70–99)
Potassium: 4.5 mmol/L (ref 3.5–5.1)
Sodium: 136 mmol/L (ref 135–145)
Total Bilirubin: 1 mg/dL (ref 0.3–1.2)
Total Protein: 6 g/dL — ABNORMAL LOW (ref 6.5–8.1)

## 2019-03-29 LAB — CBC
HCT: 37.7 % — ABNORMAL LOW (ref 39.0–52.0)
Hemoglobin: 13.6 g/dL (ref 13.0–17.0)
MCH: 28.9 pg (ref 26.0–34.0)
MCHC: 36.1 g/dL — ABNORMAL HIGH (ref 30.0–36.0)
MCV: 80 fL (ref 80.0–100.0)
Platelets: 223 10*3/uL (ref 150–400)
RBC: 4.71 MIL/uL (ref 4.22–5.81)
RDW: 12.3 % (ref 11.5–15.5)
WBC: 18.1 10*3/uL — ABNORMAL HIGH (ref 4.0–10.5)
nRBC: 0 % (ref 0.0–0.2)

## 2019-03-29 LAB — D-DIMER, QUANTITATIVE: D-Dimer, Quant: 1.89 ug/mL-FEU — ABNORMAL HIGH (ref 0.00–0.50)

## 2019-03-29 LAB — ECHOCARDIOGRAM COMPLETE
Height: 71 in
Weight: 3717.84 oz

## 2019-03-29 LAB — GLUCOSE, CAPILLARY
Glucose-Capillary: 210 mg/dL — ABNORMAL HIGH (ref 70–99)
Glucose-Capillary: 333 mg/dL — ABNORMAL HIGH (ref 70–99)

## 2019-03-29 LAB — C-REACTIVE PROTEIN: CRP: 1.7 mg/dL — ABNORMAL HIGH (ref <1.0)

## 2019-03-29 LAB — FERRITIN: Ferritin: 321 ng/mL (ref 24–336)

## 2019-03-29 MED ORDER — METOPROLOL TARTRATE 5 MG/5ML IV SOLN
5.0000 mg | Freq: Once | INTRAVENOUS | Status: AC
  Administered 2019-03-29: 5 mg via INTRAVENOUS
  Filled 2019-03-29: qty 5

## 2019-03-29 MED ORDER — FUROSEMIDE 10 MG/ML IJ SOLN
40.0000 mg | Freq: Once | INTRAMUSCULAR | Status: AC
  Administered 2019-03-29: 40 mg via INTRAVENOUS
  Filled 2019-03-29: qty 4

## 2019-03-29 MED ORDER — METOPROLOL TARTRATE 5 MG/5ML IV SOLN
5.0000 mg | INTRAVENOUS | Status: DC | PRN
  Administered 2019-03-30 – 2019-03-31 (×2): 5 mg via INTRAVENOUS
  Filled 2019-03-29 (×3): qty 5

## 2019-03-29 MED ORDER — SALINE SPRAY 0.65 % NA SOLN
1.0000 | NASAL | Status: DC | PRN
  Administered 2019-03-31: 1 via NASAL
  Filled 2019-03-29: qty 44

## 2019-03-29 NOTE — Progress Notes (Signed)
  Echocardiogram 2D Echocardiogram has been performed.  Cameron Diaz 03/29/2019, 12:21 PM

## 2019-03-29 NOTE — Plan of Care (Signed)
[name]. VSS, SpO2 >88% on 9L HFNC & 100%NRB. Dyspnea w/ exertion, able to recover independently. Utilizing bedside commode & urinal.  Spoke to pts wife on the phone, updated her w/ plan of care  Will continue to monitor & report to oncoming shift.   Cameron Diaz   Problem: Education: Goal: Knowledge of risk factors and measures for prevention of condition will improve Outcome: Progressing   Problem: Coping: Goal: Psychosocial and spiritual needs will be supported Outcome: Progressing   Problem: Respiratory: Goal: Will maintain a patent airway Outcome: Progressing Goal: Complications related to the disease process, condition or treatment will be avoided or minimized Outcome: Progressing
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
## 2019-03-29 NOTE — Progress Notes (Signed)
Pt.'s HR 130's, 15L HL and 15 L NR, sats low 90's. Md at bedside. Orders received. Pt. Can be very difficult and non complaint. He is constantly on the phone complaining to wife, he has to stop and try to catch his breath between sentences. Wife talked to RN and was screaming stating that the doctor has not contacted her all morning. I stated that it was only 10:00 am and doctor has multiple patients to see first before he can call all the families. Rn stated that her talking to him on the phone is only stressing the patient more. Pt is now on the phone with his mother. Gasping for air again. He doesn't listen to nurse and instructions to focus on his breathing and refused to prone.

## 2019-03-30 ENCOUNTER — Other Ambulatory Visit: Payer: Self-pay

## 2019-03-30 LAB — COMPREHENSIVE METABOLIC PANEL
ALT: 30 U/L (ref 0–44)
AST: 24 U/L (ref 15–41)
Albumin: 3 g/dL — ABNORMAL LOW (ref 3.5–5.0)
Alkaline Phosphatase: 98 U/L (ref 38–126)
Anion gap: 14 (ref 5–15)
BUN: 25 mg/dL — ABNORMAL HIGH (ref 6–20)
CO2: 21 mmol/L — ABNORMAL LOW (ref 22–32)
Calcium: 9.1 mg/dL (ref 8.9–10.3)
Chloride: 98 mmol/L (ref 98–111)
Creatinine, Ser: 0.96 mg/dL (ref 0.61–1.24)
GFR calc Af Amer: >60 mL/min (ref >60)
GFR calc non Af Amer: >60 mL/min (ref >60)
Glucose, Bld: 301 mg/dL — ABNORMAL HIGH (ref 70–99)
Potassium: 4.5 mmol/L (ref 3.5–5.1)
Sodium: 133 mmol/L — ABNORMAL LOW (ref 135–145)
Total Bilirubin: 0.6 mg/dL (ref 0.3–1.2)
Total Protein: 7 g/dL (ref 6.5–8.1)

## 2019-03-30 LAB — GLUCOSE, CAPILLARY
Glucose-Capillary: 157 mg/dL — ABNORMAL HIGH (ref 70–99)
Glucose-Capillary: 156 mg/dL — ABNORMAL HIGH (ref 70–99)
Glucose-Capillary: 205 mg/dL — ABNORMAL HIGH (ref 70–99)

## 2019-03-30 LAB — CBC
HCT: 42.3 % (ref 39.0–52.0)
Hemoglobin: 15.4 g/dL (ref 13.0–17.0)
MCH: 29.4 pg (ref 26.0–34.0)
MCHC: 36.4 g/dL — ABNORMAL HIGH (ref 30.0–36.0)
MCV: 80.7 fL (ref 80.0–100.0)
Platelets: 279 10*3/uL (ref 150–400)
RBC: 5.24 MIL/uL (ref 4.22–5.81)
RDW: 12.5 % (ref 11.5–15.5)
WBC: 23.2 10*3/uL — ABNORMAL HIGH (ref 4.0–10.5)
nRBC: 0 % (ref 0.0–0.2)

## 2019-03-30 LAB — C-REACTIVE PROTEIN: CRP: 1.4 mg/dL — ABNORMAL HIGH (ref <1.0)

## 2019-03-30 LAB — FERRITIN: Ferritin: 331 ng/mL (ref 24–336)

## 2019-03-30 LAB — D-DIMER, QUANTITATIVE: D-Dimer, Quant: 1.53 ug/mL-FEU — ABNORMAL HIGH (ref 0.00–0.50)

## 2019-03-30 MED ORDER — RIVAROXABAN 20 MG PO TABS: 20.0000 mg | ORAL_TABLET | Freq: Every day | ORAL | Status: DC

## 2019-03-30 MED ORDER — FUROSEMIDE 10 MG/ML IJ SOLN
40.0000 mg | Freq: Once | INTRAMUSCULAR | Status: AC
  Administered 2019-03-30: 40 mg via INTRAVENOUS
  Filled 2019-03-30: qty 4

## 2019-03-30 MED ORDER — RIVAROXABAN 15 MG PO TABS
15.0000 mg | ORAL_TABLET | Freq: Two times a day (BID) | ORAL | Status: DC
  Administered 2019-03-30 – 2019-04-04 (×10): 15 mg via ORAL
  Filled 2019-03-30 (×12): qty 1
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
## 2019-03-31 ENCOUNTER — Other Ambulatory Visit: Payer: Self-pay

## 2019-03-31 DIAGNOSIS — I824Y3 Acute embolism and thrombosis of unspecified deep veins of proximal lower extremity, bilateral: Secondary | ICD-10-CM

## 2019-03-31 LAB — C-REACTIVE PROTEIN: CRP: 0.8 mg/dL (ref <1.0)

## 2019-03-31 LAB — COMPREHENSIVE METABOLIC PANEL
ALT: 27 U/L (ref 0–44)
AST: 21 U/L (ref 15–41)
Albumin: 2.8 g/dL — ABNORMAL LOW (ref 3.5–5.0)
Alkaline Phosphatase: 68 U/L (ref 38–126)
Anion gap: 12 (ref 5–15)
BUN: 28 mg/dL — ABNORMAL HIGH (ref 6–20)
CO2: 25 mmol/L (ref 22–32)
Calcium: 8.6 mg/dL — ABNORMAL LOW (ref 8.9–10.3)
Chloride: 98 mmol/L (ref 98–111)
Creatinine, Ser: 0.91 mg/dL (ref 0.61–1.24)
GFR calc Af Amer: >60 mL/min (ref >60)
GFR calc non Af Amer: >60 mL/min (ref >60)
Glucose, Bld: 171 mg/dL — ABNORMAL HIGH (ref 70–99)
Potassium: 4.8 mmol/L (ref 3.5–5.1)
Sodium: 135 mmol/L (ref 135–145)
Total Bilirubin: 1.1 mg/dL (ref 0.3–1.2)
Total Protein: 6.4 g/dL — ABNORMAL LOW (ref 6.5–8.1)

## 2019-03-31 LAB — GLUCOSE, CAPILLARY
Glucose-Capillary: 138 mg/dL — ABNORMAL HIGH (ref 70–99)
Glucose-Capillary: 377 mg/dL — ABNORMAL HIGH (ref 70–99)
Glucose-Capillary: 297 mg/dL — ABNORMAL HIGH (ref 70–99)
Glucose-Capillary: 190 mg/dL — ABNORMAL HIGH (ref 70–99)
Glucose-Capillary: 257 mg/dL — ABNORMAL HIGH (ref 70–99)
Glucose-Capillary: 197 mg/dL — ABNORMAL HIGH (ref 70–99)
Glucose-Capillary: 136 mg/dL — ABNORMAL HIGH (ref 70–99)

## 2019-03-31 LAB — D-DIMER, QUANTITATIVE: D-Dimer, Quant: 1.14 ug/mL-FEU — ABNORMAL HIGH (ref 0.00–0.50)

## 2019-03-31 LAB — CBC
HCT: 40.2 % (ref 39.0–52.0)
Hemoglobin: 14.5 g/dL (ref 13.0–17.0)
MCH: 29.2 pg (ref 26.0–34.0)
MCHC: 36.1 g/dL — ABNORMAL HIGH (ref 30.0–36.0)
MCV: 81 fL (ref 80.0–100.0)
Platelets: 320 10*3/uL (ref 150–400)
RBC: 4.96 MIL/uL (ref 4.22–5.81)
RDW: 12.6 % (ref 11.5–15.5)
WBC: 19.3 10*3/uL — ABNORMAL HIGH (ref 4.0–10.5)
nRBC: 0 % (ref 0.0–0.2)

## 2019-03-31 LAB — FERRITIN: Ferritin: 302 ng/mL (ref 24–336)

## 2019-03-31 MED ORDER — LISINOPRIL 20 MG PO TABS
20.0000 mg | ORAL_TABLET | Freq: Every day | ORAL | Status: DC
  Administered 2019-04-01 – 2019-04-03 (×3): 20 mg via ORAL
  Filled 2019-03-31 (×3): qty 1

## 2019-03-31 MED ORDER — METOPROLOL TARTRATE 25 MG PO TABS
25.0000 mg | ORAL_TABLET | Freq: Two times a day (BID) | ORAL | Status: DC
  Administered 2019-03-31 – 2019-04-09 (×18): 25 mg via ORAL
  Filled 2019-03-31 (×19): qty 1

## 2019-03-31 MED ORDER — METHYLPREDNISOLONE SODIUM SUCC 125 MG IJ SOLR
60.0000 mg | INTRAMUSCULAR | Status: DC
  Administered 2019-04-01 – 2019-04-03 (×3): 60 mg via INTRAVENOUS
  Filled 2019-03-31 (×3): qty 2

## 2019-03-31 NOTE — Progress Notes (Signed)
RN called and spoke with with pt's wife. Updated given. No further concerns
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
## 2019-03-31 NOTE — Discharge Instructions (Addendum)
Deep Vein Thrombosis  Deep vein thrombosis (DVT) is a condition in which a blood clot forms in a deep vein, such as a lower leg, thigh, or arm vein. A clot is blood that has thickened into a gel or solid. This condition is dangerous. It can lead to serious and even life-threatening complications if the clot travels to the lungs and causes a blockage (pulmonary embolism). It can also damage veins in the leg. This can result in leg pain, swelling, discoloration, and sores (post-thrombotic syndrome). What are the causes? This condition may be caused by:  A slowdown of blood flow.  Damage to a vein.  A condition that causes blood to clot more easily, such as an inherited clotting disorder. What increases the risk? The following factors may make you more likely to develop this condition:  Being overweight.  Being older, especially over age 60.  Sitting or lying down for more than four hours.  Being in the hospital.  Lack of physical activity (sedentary lifestyle).  Pregnancy, being in childbirth, or having recently given birth.  Taking medicines that contain estrogen, such as medicines to prevent pregnancy.  Smoking.  A history of any of the following: ? Blood clots or a blood clotting disease. ? Peripheral vascular disease. ? Inflammatory bowel disease. ? Cancer. ? Heart disease. ? Genetic conditions that affect how your blood clots, such as Factor V Leiden mutation. ? Neurological diseases that affect your legs (leg paresis). ? A recent injury, such as a car accident. ? Major or lengthy surgery. ? A central line placed inside a large vein. What are the signs or symptoms? Symptoms of this condition include:  Swelling, pain, or tenderness in an arm or leg.  Warmth, redness, or discoloration in an arm or leg. If the clot is in your leg, symptoms may be more noticeable or worse when you stand or walk. Some people may not develop any symptoms. How is this diagnosed? This  condition is diagnosed with:  A medical history and physical exam.  Tests, such as: ? Blood tests. These are done to check how well your blood clots. ? Ultrasound. This is done to check for clots. ? Venogram. For this test, contrast dye is injected into a vein and X-rays are taken to check for any clots. How is this treated? Treatment for this condition depends on:  The cause of your DVT.  Your risk for bleeding or developing more clots.  Any other medical conditions that you have. Treatment may include:  Taking a blood thinner (anticoagulant). This type of medicine prevents clots from forming. It may be taken by mouth, injected under the skin, or injected through an IV (catheter).  Injecting clot-dissolving medicines into the affected vein (catheter-directed thrombolysis).  Having surgery. Surgery may be done to: ? Remove the clot. ? Place a filter in a large vein to catch blood clots before they reach the lungs. Some treatments may be continued for up to six months. Follow these instructions at home: If you are taking blood thinners:  Take the medicine exactly as told by your health care provider. Some blood thinners need to be taken at the same time every day. Do not skip a dose.  Talk with your health care provider before you take any medicines that contain aspirin or NSAIDs. These medicines increase your risk for dangerous bleeding.  Ask your health care provider about foods and drugs that could change the way the medicine works (may interact). Avoid those things if your   health care provider tells you to do so.  Blood thinners can cause easy bruising and may make it difficult to stop bleeding. Because of this: ? Be very careful when using knives, scissors, or other sharp objects. ? Use an electric razor instead of a blade. ? Avoid activities that could cause injury or bruising, and follow instructions about how to prevent falls.  Wear a medical alert bracelet or carry a  card that lists what medicines you take. General instructions  Take over-the-counter and prescription medicines only as told by your health care provider.  Return to your normal activities as told by your health care provider. Ask your health care provider what activities are safe for you.  Wear compression stockings if recommended by your health care provider.  Keep all follow-up visits as told by your health care provider. This is important. How is this prevented? To lower your risk of developing this condition again:  For 30 or more minutes every day, do an activity that: ? Involves moving your arms and legs. ? Increases your heart rate.  When traveling for longer than four hours: ? Exercise your arms and legs every hour. ? Drink plenty of water. ? Avoid drinking alcohol.  Avoid sitting or lying for a long time without moving your legs.  If you have surgery or you are hospitalized, ask about ways to prevent blood clots. These may include taking frequent walks or using anticoagulants.  Stay at a healthy weight.  If you are a woman who is older than age 19, avoid unnecessary use of medicines that contain estrogen, such as some birth control pills.  Do not use any products that contain nicotine or tobacco, such as cigarettes and e-cigarettes. This is especially important if you take estrogen medicines. If you need help quitting, ask your health care provider. Contact a health care provider if:  You miss a dose of your blood thinner.  Your menstrual period is heavier than usual.  You have unusual bruising. Get help right away if:  You have: ? New or increased pain, swelling, or redness in an arm or leg. ? Numbness or tingling in an arm or leg. ? Shortness of breath. ? Chest pain. ? A rapid or irregular heartbeat. ? A severe headache or confusion. ? A cut that will not stop bleeding.  There is blood in your vomit, stool, or urine.  You have a serious fall or accident,  or you hit your head.  You feel light-headed or dizzy.  You cough up blood. These symptoms may represent a serious problem that is an emergency. Do not wait to see if the symptoms will go away. Get medical help right away. Call your local emergency services (911 in the [place] Do not drive yourself to the hospital. Summary  Deep vein thrombosis (DVT) is a condition in which a blood clot forms in a deep vein, such as a lower leg, thigh, or arm vein.  Symptoms can include swelling, warmth, pain, and redness in your leg or arm.  This condition may be treated with a blood thinner (anticoagulant medicine), medicine that is injected to dissolve blood clots,compression stockings, or surgery.  If you are prescribed blood thinners, take them exactly as told. This information is not intended to replace advice given to you by your health care provider. Make sure you discuss any questions you have with your health care provider. Document Released: 03/27/2005 Document Revised: 03/09/2017 Document Reviewed: 08/25/2016 Elsevier Patient Education  2020 [employer].  COVID-19 COVID-19 is a respiratory infection that is caused by a virus called severe acute respiratory syndrome coronavirus 2 (SARS-CoV-2). The disease is also known as coronavirus disease or novel coronavirus. In some people, the virus may not cause any symptoms. In others, it may cause a serious infection. The infection can get worse quickly and can lead to complications, such as:  Pneumonia, or infection of the lungs.  Acute respiratory distress syndrome or ARDS. This is fluid build-up in the lungs.  Acute respiratory failure. This is a condition in which there is not enough oxygen passing from the lungs to the body.  Sepsis or septic shock. This is a serious bodily reaction to an infection.  Blood clotting problems.  Secondary infections due to bacteria or fungus. The virus that causes COVID-19 is contagious. This means that it  can spread from person to person through droplets from coughs and sneezes (respiratory secretions). What are the causes? This illness is caused by a virus. You may catch the virus by:  Breathing in droplets from an infected person's cough or sneeze.  Touching something, like a table or a doorknob, that was exposed to the virus (contaminated) and then touching your mouth, nose, or eyes. What increases the risk? Risk for infection You are more likely to be infected with this virus if you:  Live in or travel to an area with a COVID-19 outbreak.  Come in contact with a sick person who recently traveled to an area with a COVID-19 outbreak.  Provide care for or live with a person who is infected with COVID-19. Risk for serious illness You are more likely to become seriously ill from the virus if you:  Are 73 years of age or older.  Have a long-term disease that lowers your body's ability to fight infection (immunocompromised).  Live in a nursing home or long-term care facility.  Have a long-term (chronic) disease such as: ? Chronic lung disease, including chronic obstructive pulmonary disease or asthma ? Heart disease. ? Diabetes. ? Chronic kidney disease. ? Liver disease.  Are obese. What are the signs or symptoms? Symptoms of this condition can range from mild to severe. Symptoms may appear any time from 2 to 14 days after being exposed to the virus. They include:  A fever.  A cough.  Difficulty breathing.  Chills.  Muscle pains.  A sore throat.  Loss of taste or smell. Some people may also have stomach problems, such as nausea, vomiting, or diarrhea. Other people may not have any symptoms of COVID-19. How is this diagnosed? This condition may be diagnosed based on:  Your signs and symptoms, especially if: ? You live in an area with a COVID-19 outbreak. ? You recently traveled to or from an area where the virus is common. ? You provide care for or live with a  person who was diagnosed with COVID-19.  A physical exam.  Lab tests, which may include: ? A nasal swab to take a sample of fluid from your nose. ? A throat swab to take a sample of fluid from your throat. ? A sample of mucus from your lungs (sputum). ? Blood tests.  Imaging tests, which may include, X-rays, CT scan, or ultrasound. How is this treated? At present, there is no medicine to treat COVID-19. Medicines that treat other diseases are being used on a trial basis to see if they are effective against COVID-19. Your health care provider will talk with you about ways to treat your symptoms. For  most people, the infection is mild and can be managed at home with rest, fluids, and over-the-counter medicines. Treatment for a serious infection usually takes places in a hospital intensive care unit (ICU). It may include one or more of the following treatments. These treatments are given until your symptoms improve.  Receiving fluids and medicines through an IV.  Supplemental oxygen. Extra oxygen is given through a tube in the nose, a face mask, or a hood.  Positioning you to lie on your stomach (prone position). This makes it easier for oxygen to get into the lungs.  Continuous positive airway pressure (CPAP) or bi-level positive airway pressure (BPAP) machine. This treatment uses mild air pressure to keep the airways open. A tube that is connected to a motor delivers oxygen to the body.  Ventilator. This treatment moves air into and out of the lungs by using a tube that is placed in your windpipe.  Tracheostomy. This is a procedure to create a hole in the neck so that a breathing tube can be inserted.  Extracorporeal membrane oxygenation (ECMO). This procedure gives the lungs a chance to recover by taking over the functions of the heart and lungs. It supplies oxygen to the body and removes carbon dioxide. Follow these instructions at home: Lifestyle  If you are sick, stay home except  to get medical care. Your health care provider will tell you how long to stay home. Call your health care provider before you go for medical care.  Rest at home as told by your health care provider.  Do not use any products that contain nicotine or tobacco, such as cigarettes, e-cigarettes, and chewing tobacco. If you need help quitting, ask your health care provider.  Return to your normal activities as told by your health care provider. Ask your health care provider what activities are safe for you. General instructions  Take over-the-counter and prescription medicines only as told by your health care provider.  Drink enough fluid to keep your urine pale yellow.  Keep all follow-up visits as told by your health care provider. This is important. How is this prevented?  There is no vaccine to help prevent COVID-19 infection. However, there are steps you can take to protect yourself and others from this virus. To protect yourself:   Do not travel to areas where COVID-19 is a risk. The areas where COVID-19 is reported change often. To identify high-risk areas and travel restrictions, check the CDC travel website: FatFares.com.br  If you live in, or must travel to, an area where COVID-19 is a risk, take precautions to avoid infection. ? Stay away from people who are sick. ? Wash your hands often with soap and water for 20 seconds. If soap and water are not available, use an alcohol-based hand sanitizer. ? Avoid touching your mouth, face, eyes, or nose. ? Avoid going out in public, follow guidance from your state and local health authorities. ? If you must go out in public, wear a cloth face covering or face mask. ? Disinfect objects and surfaces that are frequently touched every day. This may include:  Counters and tables.  Doorknobs and light switches.  Sinks and faucets.  Electronics, such as phones, remote controls, keyboards, computers, and tablets. To protect  others: If you have symptoms of COVID-19, take steps to prevent the virus from spreading to others.  If you think you have a COVID-19 infection, contact your health care provider right away. Tell your health care team that you think you  may have a COVID-19 infection.  Stay home. Leave your house only to seek medical care. Do not use public transport.  Do not travel while you are sick.  Wash your hands often with soap and water for 20 seconds. If soap and water are not available, use alcohol-based hand sanitizer.  Stay away from other members of your household. Let healthy household members care for children and pets, if possible. If you have to care for children or pets, wash your hands often and wear a mask. If possible, stay in your own room, separate from others. Use a different bathroom.  Make sure that all people in your household wash their hands well and often.  Cough or sneeze into a tissue or your sleeve or elbow. Do not cough or sneeze into your hand or into the air.  Wear a cloth face covering or face mask. Where to find more information  Centers for Disease Control and Prevention: PurpleGadgets.be  World Health Organization: https://www.castaneda.info/ Contact a health care provider if:  You live in or have traveled to an area where COVID-19 is a risk and you have symptoms of the infection.  You have had contact with someone who has COVID-19 and you have symptoms of the infection. Get help right away if:  You have trouble breathing.  You have pain or pressure in your chest.  You have confusion.  You have bluish lips and fingernails.  You have difficulty waking from sleep.  You have symptoms that get worse. These symptoms may represent a serious problem that is an emergency. Do not wait to see if the symptoms will go away. Get medical help right away. Call your local emergency services (911 in the [place] Do not drive yourself  to the hospital. Let the emergency medical personnel know if you think you have COVID-19. Summary  COVID-19 is a respiratory infection that is caused by a virus. It is also known as coronavirus disease or novel coronavirus. It can cause serious infections, such as pneumonia, acute respiratory distress syndrome, acute respiratory failure, or sepsis.  The virus that causes COVID-19 is contagious. This means that it can spread from person to person through droplets from coughs and sneezes.  You are more likely to develop a serious illness if you are 47 years of age or older, have a weak immunity, live in a nursing home, or have chronic disease.  There is no medicine to treat COVID-19. Your health care provider will talk with you about ways to treat your symptoms.  Take steps to protect yourself and others from infection. Wash your hands often and disinfect objects and surfaces that are frequently touched every day. Stay away from people who are sick and wear a mask if you are sick. This information is not intended to replace advice given to you by your health care provider. Make sure you discuss any questions you have with your health care provider. Document Released: 05/02/2018 Document Revised: 08/22/2018 Document Reviewed: 05/02/2018 Elsevier Patient Education  2020 [employer] on my medicine - XARELTO (rivaroxaban)  This medication education was reviewed with me or my healthcare representative as part of my discharge preparation.    WHY WAS XARELTO PRESCRIBED FOR YOU? Xarelto was prescribed to treat blood clots that may have been found in the veins of your legs (deep vein thrombosis) or in your lungs (pulmonary embolism) and to reduce the risk of them occurring again.  What do you need to know about Xarelto? The starting  dose is one 15 mg tablet taken TWICE daily with food for the FIRST 21 DAYS then on (enter date)  04/20/19  the dose is changed to one 20 mg tablet taken  ONCE A DAY with your evening meal.  DO NOT stop taking Xarelto without talking to the health care provider who prescribed the medication.  Refill your prescription for 20 mg tablets before you run out.  After discharge, you should have regular check-up appointments with your healthcare provider that is prescribing your Xarelto.  In the future your dose may need to be changed if your kidney function changes by a significant amount.  What do you do if you miss a dose? If you are taking Xarelto TWICE DAILY and you miss a dose, take it as soon as you remember. You may take two 15 mg tablets (total 30 mg) at the same time then resume your regularly scheduled 15 mg twice daily the next day.  If you are taking Xarelto ONCE DAILY and you miss a dose, take it as soon as you remember on the same day then continue your regularly scheduled once daily regimen the next day. Do not take two doses of Xarelto at the same time.   Important Safety Information Xarelto is a blood thinner medicine that can cause bleeding. You should call your healthcare provider right away if you experience any of the following: ? Bleeding from an injury or your nose that does not stop. ? Unusual colored urine (red or dark brown) or unusual colored stools (red or black). ? Unusual bruising for unknown reasons. ? A serious fall or if you hit your head (even if there is no bleeding).  Some medicines may interact with Xarelto and might increase your risk of bleeding while on Xarelto. To help avoid this, consult your healthcare provider or pharmacist prior to using any new prescription or non-prescription medications, including herbals, vitamins, non-steroidal anti-inflammatory drugs (NSAIDs) and supplements.  This website has more information on Xarelto: https://guerra-benson.com/.

## 2019-03-31 NOTE — Plan of Care (Signed)
Pt slept well during the night. No complaints of pain verbalized. Alert and oriented. Vitals stable )2 weaned to 6L HFNC. Minimal dyspnea on exertion and productive cough noted.Tolerated prone positioning overnight. No other issues, will monitor.   Problem: Education: Goal: Knowledge of risk factors and measures for prevention of condition will improve Outcome: Progressing   Problem: Coping: Goal: Psychosocial and spiritual needs will be supported Outcome: Progressing   Problem: Respiratory: Goal: Will maintain a patent airway Outcome: Progressing Goal: Complications related to the disease process, condition or treatment will be avoided or minimized Outcome: Progressing

## 2019-03-31 NOTE — Progress Notes (Signed)
Pt.'s wife wants to talk to social services about setting up home care, oxygen and plan of care. RN notified SS. She also wants pt. To take eliquis instead of xarelto. MD notified also.

## 2019-04-01 LAB — COMPREHENSIVE METABOLIC PANEL
ALT: 47 U/L — ABNORMAL HIGH (ref 0–44)
AST: 40 U/L (ref 15–41)
Albumin: 3.3 g/dL — ABNORMAL LOW (ref 3.5–5.0)
Alkaline Phosphatase: 83 U/L (ref 38–126)
Anion gap: 13 (ref 5–15)
BUN: 25 mg/dL — ABNORMAL HIGH (ref 6–20)
CO2: 27 mmol/L (ref 22–32)
Calcium: 9 mg/dL (ref 8.9–10.3)
Chloride: 95 mmol/L — ABNORMAL LOW (ref 98–111)
Creatinine, Ser: 0.98 mg/dL (ref 0.61–1.24)
GFR calc Af Amer: >60 mL/min (ref >60)
GFR calc non Af Amer: >60 mL/min (ref >60)
Glucose, Bld: 46 mg/dL — ABNORMAL LOW (ref 70–99)
Potassium: 4.4 mmol/L (ref 3.5–5.1)
Sodium: 135 mmol/L (ref 135–145)
Total Bilirubin: 1.4 mg/dL — ABNORMAL HIGH (ref 0.3–1.2)
Total Protein: 7 g/dL (ref 6.5–8.1)

## 2019-04-01 LAB — D-DIMER, QUANTITATIVE: D-Dimer, Quant: 1.71 ug/mL-FEU — ABNORMAL HIGH (ref 0.00–0.50)

## 2019-04-01 LAB — GLUCOSE, CAPILLARY
Glucose-Capillary: 56 mg/dL — ABNORMAL LOW (ref 70–99)
Glucose-Capillary: 346 mg/dL — ABNORMAL HIGH (ref 70–99)
Glucose-Capillary: 138 mg/dL — ABNORMAL HIGH (ref 70–99)
Glucose-Capillary: 233 mg/dL — ABNORMAL HIGH (ref 70–99)

## 2019-04-01 LAB — C-REACTIVE PROTEIN: CRP: 0.6 mg/dL (ref <1.0)

## 2019-04-01 LAB — FERRITIN: Ferritin: 353 ng/mL — ABNORMAL HIGH (ref 24–336)

## 2019-04-01 MED ORDER — INSULIN DETEMIR 100 UNIT/ML ~~LOC~~ SOLN
15.0000 [IU] | Freq: Every day | SUBCUTANEOUS | Status: DC
  Administered 2019-04-02: 15 [IU] via SUBCUTANEOUS
  Filled 2019-04-01 (×2): qty 0.15

## 2019-04-01 NOTE — Plan of Care (Signed)
Pt slept well during the night. No complaints of pain verbalized. Alert and oriented. Vitals stable on 6L HFNC, unable to wean down as pt desaturates to low 80s with exertion . Minimal dyspnea on exertion and productive cough noted.T olerated prone positioning overnight. No other issues, will monitor.  Problem: Education: Goal: Knowledge of risk factors and measures for prevention of condition will improve Outcome: Progressing   Problem: Coping: Goal: Psychosocial and spiritual needs will be supported Outcome: Progressing   Problem: Respiratory: Goal: Will maintain a patent airway Outcome: Progressing Goal: Complications related to the disease process, condition or treatment will be avoided or minimized Outcome: Progressing
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
## 2019-04-02 LAB — COMPREHENSIVE METABOLIC PANEL
ALT: 46 U/L — ABNORMAL HIGH (ref 0–44)
AST: 31 U/L (ref 15–41)
Albumin: 3 g/dL — ABNORMAL LOW (ref 3.5–5.0)
Alkaline Phosphatase: 79 U/L (ref 38–126)
Anion gap: 10 (ref 5–15)
BUN: 22 mg/dL — ABNORMAL HIGH (ref 6–20)
CO2: 26 mmol/L (ref 22–32)
Calcium: 9 mg/dL (ref 8.9–10.3)
Chloride: 98 mmol/L (ref 98–111)
Creatinine, Ser: 1.05 mg/dL (ref 0.61–1.24)
GFR calc Af Amer: >60 mL/min (ref >60)
GFR calc non Af Amer: >60 mL/min (ref >60)
Glucose, Bld: 114 mg/dL — ABNORMAL HIGH (ref 70–99)
Potassium: 4.9 mmol/L (ref 3.5–5.1)
Sodium: 134 mmol/L — ABNORMAL LOW (ref 135–145)
Total Bilirubin: 0.8 mg/dL (ref 0.3–1.2)
Total Protein: 6.9 g/dL (ref 6.5–8.1)

## 2019-04-02 LAB — C-REACTIVE PROTEIN: CRP: 2.9 mg/dL — ABNORMAL HIGH (ref <1.0)

## 2019-04-02 LAB — FERRITIN: Ferritin: 398 ng/mL — ABNORMAL HIGH (ref 24–336)

## 2019-04-02 LAB — GLUCOSE, CAPILLARY
Glucose-Capillary: 102 mg/dL — ABNORMAL HIGH (ref 70–99)
Glucose-Capillary: 206 mg/dL — ABNORMAL HIGH (ref 70–99)
Glucose-Capillary: 169 mg/dL — ABNORMAL HIGH (ref 70–99)

## 2019-04-02 LAB — D-DIMER, QUANTITATIVE: D-Dimer, Quant: 1.68 ug/mL-FEU — ABNORMAL HIGH (ref 0.00–0.50)

## 2019-04-02 MED ORDER — FUROSEMIDE 10 MG/ML IJ SOLN
40.0000 mg | Freq: Once | INTRAMUSCULAR | Status: AC
  Administered 2019-04-02: 40 mg via INTRAVENOUS
  Filled 2019-04-02: qty 4

## 2019-04-02 NOTE — Progress Notes (Signed)
Spoke with patients wife on the phone. She expressed concern that patient "gets in his head" and "makes himself worse" anytime they discuss him coming home. She requested that I talk with him and his care team about this; I spoke to patient and he stated that he just doesn't feel well enough to leave yet. Will discuss with care team.

## 2019-04-02 NOTE — Progress Notes (Signed)
Pt on the phone with wife when RN in room. Wife asked how he was doing, RN gave an update via pt's phone.
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
## 2019-04-02 NOTE — Progress Notes (Signed)
Patient was on the phone with his mama when RN came in to do assessment. Spoke directly with her and answered general questions of his condition. RN gave general answers of improving condition. When finished, RN gave phone back to pt to continue his conversation. RN left the room at this time.

## 2019-04-02 NOTE — Plan of Care (Signed)
  Problem: Education: Goal: Knowledge of risk factors and measures for prevention of condition will improve Outcome: Progressing   Problem: Coping: Goal: Psychosocial and spiritual needs will be supported Outcome: Progressing   Problem: Respiratory: Goal: Will maintain a patent airway Outcome: Progressing Goal: Complications related to the disease process, condition or treatment will be avoided or minimized Outcome: Progressing   

## 2019-04-02 NOTE — Progress Notes (Signed)
Patient in a notably more chipper mood around 0930 today, asked if he could get his room tidied and be given everything he needs for a bath. I changed his linens, tidied up the room, and left him with towels and soap and washcloths. I returned about 30 minutes later. Patient expressed feeling much better after having had the chance to get cleaned up and that he was happy to not feel the need for extra oxygen throughout the process.

## 2019-04-02 NOTE — Progress Notes (Signed)
Patient remains in a chipper mood with all interactions, stating that he feels much better. He has been up in the chair and ambulated in room as well as sit/stand exercises throughout day. Ambulated in hall, all four hallways of [address] making the "loop" from his room back to his room. He had two standing rest breaks (less than 1 minute each time). He did not express any shortness of breath or other adverse events with walk. Plan to walk again before end of shift.

## 2019-04-03 ENCOUNTER — Inpatient Hospital Stay (HOSPITAL_COMMUNITY): Payer: PPO

## 2019-04-03 DIAGNOSIS — J690 Pneumonitis due to inhalation of food and vomit: Secondary | ICD-10-CM

## 2019-04-03 LAB — C-REACTIVE PROTEIN: CRP: 5.9 mg/dL — ABNORMAL HIGH (ref <1.0)

## 2019-04-03 LAB — CBC
HCT: 44.8 % (ref 39.0–52.0)
Hemoglobin: 16.1 g/dL (ref 13.0–17.0)
MCH: 29.2 pg (ref 26.0–34.0)
MCHC: 35.9 g/dL (ref 30.0–36.0)
MCV: 81.2 fL (ref 80.0–100.0)
Platelets: 310 10*3/uL (ref 150–400)
RBC: 5.52 MIL/uL (ref 4.22–5.81)
RDW: 13.1 % (ref 11.5–15.5)
WBC: 15.9 10*3/uL — ABNORMAL HIGH (ref 4.0–10.5)
nRBC: 0 % (ref 0.0–0.2)

## 2019-04-03 LAB — COMPREHENSIVE METABOLIC PANEL
ALT: 39 U/L (ref 0–44)
AST: 27 U/L (ref 15–41)
Albumin: 2.8 g/dL — ABNORMAL LOW (ref 3.5–5.0)
Alkaline Phosphatase: 72 U/L (ref 38–126)
Anion gap: 14 (ref 5–15)
BUN: 30 mg/dL — ABNORMAL HIGH (ref 6–20)
CO2: 23 mmol/L (ref 22–32)
Calcium: 8.7 mg/dL — ABNORMAL LOW (ref 8.9–10.3)
Chloride: 95 mmol/L — ABNORMAL LOW (ref 98–111)
Creatinine, Ser: 1.13 mg/dL (ref 0.61–1.24)
GFR calc Af Amer: >60 mL/min (ref >60)
GFR calc non Af Amer: >60 mL/min (ref >60)
Glucose, Bld: 150 mg/dL — ABNORMAL HIGH (ref 70–99)
Potassium: 4.5 mmol/L (ref 3.5–5.1)
Sodium: 132 mmol/L — ABNORMAL LOW (ref 135–145)
Total Bilirubin: 1 mg/dL (ref 0.3–1.2)
Total Protein: 6.5 g/dL (ref 6.5–8.1)

## 2019-04-03 LAB — GLUCOSE, CAPILLARY
Glucose-Capillary: 306 mg/dL — ABNORMAL HIGH (ref 70–99)
Glucose-Capillary: 55 mg/dL — ABNORMAL LOW (ref 70–99)
Glucose-Capillary: 130 mg/dL — ABNORMAL HIGH (ref 70–99)
Glucose-Capillary: 120 mg/dL — ABNORMAL HIGH (ref 70–99)
Glucose-Capillary: 271 mg/dL — ABNORMAL HIGH (ref 70–99)
Glucose-Capillary: 322 mg/dL — ABNORMAL HIGH (ref 70–99)
Glucose-Capillary: 342 mg/dL — ABNORMAL HIGH (ref 70–99)

## 2019-04-03 MED ORDER — FUROSEMIDE 10 MG/ML IJ SOLN
40.0000 mg | Freq: Once | INTRAMUSCULAR | Status: AC
  Administered 2019-04-03: 40 mg via INTRAVENOUS
  Filled 2019-04-03: qty 4

## 2019-04-03 MED ORDER — LISINOPRIL 10 MG PO TABS
10.0000 mg | ORAL_TABLET | Freq: Every day | ORAL | Status: DC
  Administered 2019-04-04 – 2019-04-09 (×6): 10 mg via ORAL
  Filled 2019-04-03 (×7): qty 1

## 2019-04-03 MED ORDER — DEXTROSE 50 % IV SOLN: 12.5000 g | INTRAVENOUS | Status: DC

## 2019-04-03 MED ORDER — VANCOMYCIN HCL 1750 MG/350ML IV SOLN
1750.0000 mg | INTRAVENOUS | Status: DC
  Administered 2019-04-04 – 2019-04-06 (×3): 1750 mg via INTRAVENOUS
  Filled 2019-04-03 (×4): qty 350

## 2019-04-03 MED ORDER — VANCOMYCIN HCL 2000 MG/400ML IV SOLN
2000.0000 mg | Freq: Once | INTRAVENOUS | Status: AC
  Administered 2019-04-03: 2000 mg via INTRAVENOUS
  Filled 2019-04-03: qty 400

## 2019-04-03 MED ORDER — SACCHAROMYCES BOULARDII 250 MG PO CAPS
250.0000 mg | ORAL_CAPSULE | Freq: Two times a day (BID) | ORAL | Status: DC
  Administered 2019-04-03 – 2019-04-09 (×13): 250 mg via ORAL
  Filled 2019-04-03 (×14): qty 1

## 2019-04-03 MED ORDER — PIPERACILLIN-TAZOBACTAM 3.375 G IVPB
3.3750 g | Freq: Three times a day (TID) | INTRAVENOUS | Status: DC
  Administered 2019-04-03 – 2019-04-07 (×9): 3.375 g via INTRAVENOUS
  Filled 2019-04-03 (×14): qty 50

## 2019-04-03 MED ORDER — INSULIN DETEMIR 100 UNIT/ML ~~LOC~~ SOLN
8.0000 [IU] | Freq: Every day | SUBCUTANEOUS | Status: DC
  Administered 2019-04-04 – 2019-04-09 (×6): 8 [IU] via SUBCUTANEOUS
  Filled 2019-04-03 (×6): qty 0.08

## 2019-04-03 MED ORDER — DEXTROSE 50 % IV SOLN
25.0000 g | INTRAVENOUS | Status: AC
  Administered 2019-04-03: 25 g via INTRAVENOUS

## 2019-04-03 MED ORDER — METHYLPREDNISOLONE SODIUM SUCC 40 MG IJ SOLR
40.0000 mg | INTRAMUSCULAR | Status: DC
  Administered 2019-04-04 – 2019-04-06 (×3): 40 mg via INTRAVENOUS
  Filled 2019-04-03 (×3): qty 1

## 2019-04-03 MED ORDER — AMLODIPINE BESYLATE 5 MG PO TABS
5.0000 mg | ORAL_TABLET | Freq: Every day | ORAL | Status: DC
  Administered 2019-04-04 – 2019-04-09 (×6): 5 mg via ORAL
  Filled 2019-04-03 (×6): qty 1

## 2019-04-03 NOTE — Progress Notes (Signed)
Left message on voice mail for wife notifying her patient had been moved to [address].  Left her phone number for nursing station and patient room.

## 2019-04-03 NOTE — Progress Notes (Signed)
Attempt to update wife Cameron Diaz but no answer, left message for return call. Updated patient on plan of care already.

## 2019-04-03 NOTE — Progress Notes (Signed)
   Vital Signs MEWS/VS Documentation      04/03/2019 1717 04/03/2019 1900 04/03/2019 2000 04/03/2019 2100   MEWS Score:  0  0  0  2   MEWS Score Color:  Green  Green  Green  Yellow   Resp:  19  --  --  18   Pulse:  94  --  --  83   BP:  101/64  --  --  110/79   Temp:  97.6 F (36.4 C)  --  (!) 97.5 F (36.4 C)  (!) 89 F (31.7 C)   O2 Device:  --  --  --  Nasal Cannula           Cameron Diaz 04/03/2019,10:32 PM BP 110/79 (BP Location: Left Arm)   Pulse 83   Temp (!) 89 F (31.7 C) (Oral)   Resp 18   Ht 5\' 11"  (1.803 m)   Wt 101.3 kg   SpO2 93%   BMI 31.15 kg/m    Intake/Output Summary (Last 24 hours) at 04/03/2019 2232 Last data filed at 04/03/2019 2200 Gross per 24 hour  Intake 1362.07 ml  Output 2400 ml  Net -1037.93 ml     POC reviewed with patient, cont to reposition q2 turns, vital signs closely monitored and any concerns addressed at this time. Will continue to monitor.

## 2019-04-03 NOTE — Progress Notes (Addendum)
At about 03:30am, dry heaving was heard from the nurse's station.  Upon entering the pt's room, pt was found to be actively dry heaving.  Pt was given 4mg  of IV zofran and pt reports that nausea improved.  Pt was also observed to be shaking and pt stated he was cold.  Pt was given a warm blanket and oral temp was checked to be 98.7.  Pt was not talking in complete sentences and stated he felt SOB, pt was given his PRN albuterol inhaler with some improvement. Since it was time for routine VS, pt's VS were taken and O2 was found to be 77% RA.  Other VS were stable. Pt placed on 3L [place] with mild improvement to 82%.  Pt was proned and O2 improved to 90-95%.  Pt stated that he felt better and felt like he didn't need the O2 anymore.  At the pt request, O2 was removed and pt's O2 was observed to be 89-90% RA while remaining prone.

## 2019-04-03 NOTE — Progress Notes (Signed)
Rapid Response Event Note  Overview: Called by Pt's nurse about pt sudden increase in O2. Once entering room pt sitting in bed alert. Pt's RN said she entered pt's room and he was dry heaving, nauseous diaphoretic and slightly confused. RN checked pt's CBG and it was 55. D50 given and NRB mask placed on pt. CBG now 130      Initial Focused Assessment: Pt tachypnic 30's 40's, tachycardic 120s. Alert and oriented X4. O2 sats 85-93 on 15L NRB. CBG now normal. Pt states he feels better. But his slightly anxious.   Interventions: Moving pt to PCU floor. New IV placed.  Plan of Care (if not transferred): Moving pt to 146 on PCU floor.    at        Margaret Pyle

## 2019-04-03 NOTE — Progress Notes (Signed)
Patient noted to be anxious, diaphoretic with increased confused/impulsiveness. Vitals checked-noted to be tachycardic HR 130's. SpO2 70's on RA. Tachypneic 30's. BP otherwise stable.  Plan Placed in high-fowlers position. Placed on NRB @ 15L SpO2 up to 93 %.  MD notified. CBG checked BS @ 55-Given D50-rechecked of CBG now at 130's-patient verbalized he feels better.    Due to increase tachypnea and sudden change in patient status decision was made to called a Rapid response- rapid response team at bedside-primary nurse updated. Decision was made to upgrade patient to PCU floor
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
## 2019-04-03 NOTE — Progress Notes (Signed)
Pharmacy Antibiotic Note  Cameron Diaz is a 58 y.o. male admitted on 03/24/2019 with dyspnea after a previous hospitalization for COVID-19 PNA.  Pharmacy has been consulted for vancomycin and Zosyn dosing for possible bacterial PNA.  Plan: -Vancomycin 2000 mg iv load followed by vancomycin 1750 mg IV Q 24 hrs. Goal AUC 400-550. Expected AUC: 512.7 SCr used: 1.13  -Zosyn 3.375 g EI q 8h  -F/U MRSA PCR, culture results, renal function  -Levels as indicated   Height: 5\' 11"  (180.3 cm) Weight: 223 lb 6 oz (101.3 kg) IBW/kg (Calculated) : 75.3  Temp (24hrs), Avg:99.6 F (37.6 C), Min:98.5 F (36.9 C), Max:102.7 F (39.3 C)  Recent Labs  Lab 03/28/19 0130 03/29/19 0418 03/30/19 0141 03/31/19 0305 04/01/19 0513 04/02/19 0208 04/03/19 0540  WBC 14.1* 18.1* 23.2* 19.3*  --   --  15.9*  CREATININE 0.77 0.73 0.96 0.91 0.98 1.05 1.13    Estimated Creatinine Clearance: 86.4 mL/min (by C-G formula based on SCr of 1.13 mg/dL).    No Known Allergies  12/15 azithromycin >> 12/16  12/15 ceftriaxone >> 12/18  12/24 vancomycin >> 12/24 Zosyn >>  12/15 sputum - negative 12/24 MRSA PCR:  12/24 BCx:  12/24 SCx:    Thank you for allowing pharmacy to be a part of this patient's care.  Ulice Dash D 04/03/2019 8:46 AM

## 2019-04-04 ENCOUNTER — Other Ambulatory Visit: Payer: Self-pay

## 2019-04-04 LAB — GLUCOSE, CAPILLARY
Glucose-Capillary: 115 mg/dL — ABNORMAL HIGH (ref 70–99)
Glucose-Capillary: 365 mg/dL — ABNORMAL HIGH (ref 70–99)
Glucose-Capillary: 426 mg/dL — ABNORMAL HIGH (ref 70–99)

## 2019-04-04 MED ORDER — INSULIN ASPART 100 UNIT/ML ~~LOC~~ SOLN
10.0000 [IU] | Freq: Once | SUBCUTANEOUS | Status: AC
  Administered 2019-04-04: 10 [IU] via SUBCUTANEOUS

## 2019-04-04 MED ORDER — ESCITALOPRAM OXALATE 10 MG PO TABS
10.0000 mg | ORAL_TABLET | Freq: Every day | ORAL | Status: DC
  Administered 2019-04-04 – 2019-04-09 (×6): 10 mg via ORAL
  Filled 2019-04-04 (×6): qty 1

## 2019-04-04 MED ORDER — MORPHINE SULFATE (PF) 2 MG/ML IV SOLN
2.0000 mg | Freq: Once | INTRAVENOUS | Status: AC
  Administered 2019-04-04: 2 mg via INTRAVENOUS
  Filled 2019-04-04: qty 1

## 2019-04-04 MED ORDER — ENOXAPARIN SODIUM 100 MG/ML ~~LOC~~ SOLN
100.0000 mg | Freq: Two times a day (BID) | SUBCUTANEOUS | Status: DC
  Administered 2019-04-04 – 2019-04-08 (×8): 100 mg via SUBCUTANEOUS
  Filled 2019-04-04 (×10): qty 1
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
## 2019-04-04 NOTE — Significant Event (Signed)
   Vital Signs MEWS/VS Documentation      04/04/2019 0845 04/04/2019 0900 04/04/2019 0915 04/04/2019 0917   MEWS Score:  4  4  2  2    MEWS Score Color:  Red  Red  Yellow  Yellow   Resp:  (!) 45  (!) 39  20  (!) 22   Pulse:  (!) 103  (!) 107  (!) 117  (!) 109   BP:  131/86  (!) 132/91  112/67  (!) 125/96   Temp:  --  --  --  (!) 100.4 F (38 C)   Level of Consciousness:  --  --  --  Alert       Notified Dr. Maryland Pink at [phone] that pt was in distress, sats 82 on 15 lpm HFNC, HR 114, RR 30-40's, anxious. requested something for anxiety. See orders. Respiratory therapy placed pt on Non-rebreather mask at 15LPM and HFNC at 15 LPM. Spoke to pt's wife Lanelle Bal updated on pt's condition, this episode, the plan of care, and answered her questions. Wife voiced concern that "pt may need mental help before he will go home or she will have to bring him right back." Pt resting comfortably in bed with Non-breather at 15LPM and HFNC at 15 LPM. Remains tachypneic. MEWS went from red to yellow at this time.      Tinia Oravec M Cobi Delph 04/04/2019,10:00 AM

## 2019-04-04 NOTE — Progress Notes (Signed)
Spoke to and updated Wife Cameron Diaz. Reviewed patient's condition, oxygen level, plan of care and current treatment. Discussed pt's anxiety and his thought processes about going home. Pt stated that "he cant go home with out a nurse because he needs someone to take care of him, that he will need all the meds we are giving him, that if he has an episode of coughing or hiccups that he will die before someone care get to him." Educated patient and his wife on that just having COVID 19 does not require a hospitalization, that we do not discharge until doctor says that patient is medically stable. Educated pt on relaxation techniques- deep breathing, imagery to distract from anxiety/panic. [facility].
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
## 2019-04-04 NOTE — Progress Notes (Signed)
Pt has been titrated down to 4 litres of HFNC

## 2019-04-04 NOTE — Plan of Care (Signed)
Pt did not tolerate the 4 litres HFNC and ended up being tachypneic after a while in the 40s.  He is now tolerating 10 litres HFNC at this time.

## 2019-04-04 NOTE — TOC Progression Note (Addendum)
Transition of Care [facility]) - Progression Note    Patient Details  Name: Cameron Diaz MRN: [id number] Date of Birth: [birth year]  [facility] CM/SW Contact  Loletha Grayer Beverely Pace, RN Phone Number: 04/04/2019, 10:24 AM  Clinical Narrative:   Case manager received call from patient's wife requesting assistance with getting him mental help, wants a psych consult.She is concerned that his mental health has been impacted by his illness. She also wanted information concerning Medicaid. CM informed her that she can go online or the the Social Services dept on the next business day to begin his application process. Patient has [employer], Financial counselor will not begin process for her.  Case manager assured her that MD would be notified of her concerns, secure chat was sent.  She expressed understanding, MD will reach out to her today.         Expected Discharge Plan and Services                                                 Social Determinants of Health (SDOH) Interventions    Readmission Risk Interventions No flowsheet data found.

## 2019-04-05 ENCOUNTER — Other Ambulatory Visit: Payer: Self-pay

## 2019-04-05 ENCOUNTER — Inpatient Hospital Stay (HOSPITAL_COMMUNITY): Payer: PPO

## 2019-04-05 LAB — CBC
HCT: 40.7 % (ref 39.0–52.0)
Hemoglobin: 15 g/dL (ref 13.0–17.0)
MCH: 30.1 pg (ref 26.0–34.0)
MCHC: 36.9 g/dL — ABNORMAL HIGH (ref 30.0–36.0)
MCV: 81.7 fL (ref 80.0–100.0)
Platelets: 281 10*3/uL (ref 150–400)
RBC: 4.98 MIL/uL (ref 4.22–5.81)
RDW: 13.1 % (ref 11.5–15.5)
WBC: 8 10*3/uL (ref 4.0–10.5)
nRBC: 0 % (ref 0.0–0.2)

## 2019-04-05 LAB — GLUCOSE, CAPILLARY
Glucose-Capillary: 172 mg/dL — ABNORMAL HIGH (ref 70–99)
Glucose-Capillary: 252 mg/dL — ABNORMAL HIGH (ref 70–99)
Glucose-Capillary: 240 mg/dL — ABNORMAL HIGH (ref 70–99)
Glucose-Capillary: 265 mg/dL — ABNORMAL HIGH (ref 70–99)

## 2019-04-05 LAB — COMPREHENSIVE METABOLIC PANEL
ALT: 33 U/L (ref 0–44)
AST: 21 U/L (ref 15–41)
Albumin: 2.5 g/dL — ABNORMAL LOW (ref 3.5–5.0)
Alkaline Phosphatase: 70 U/L (ref 38–126)
Anion gap: 10 (ref 5–15)
BUN: 24 mg/dL — ABNORMAL HIGH (ref 6–20)
CO2: 24 mmol/L (ref 22–32)
Calcium: 8.7 mg/dL — ABNORMAL LOW (ref 8.9–10.3)
Chloride: 98 mmol/L (ref 98–111)
Creatinine, Ser: 1 mg/dL (ref 0.61–1.24)
GFR calc Af Amer: >60 mL/min (ref >60)
GFR calc non Af Amer: >60 mL/min (ref >60)
Glucose, Bld: 176 mg/dL — ABNORMAL HIGH (ref 70–99)
Potassium: 4.5 mmol/L (ref 3.5–5.1)
Sodium: 132 mmol/L — ABNORMAL LOW (ref 135–145)
Total Bilirubin: 0.6 mg/dL (ref 0.3–1.2)
Total Protein: 6.1 g/dL — ABNORMAL LOW (ref 6.5–8.1)

## 2019-04-05 LAB — PROCALCITONIN: Procalcitonin: 2.13 ng/mL

## 2019-04-05 LAB — C-REACTIVE PROTEIN: CRP: 8.5 mg/dL — ABNORMAL HIGH (ref <1.0)
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
## 2019-04-06 LAB — COMPREHENSIVE METABOLIC PANEL
ALT: 38 U/L (ref 0–44)
AST: 23 U/L (ref 15–41)
Albumin: 2.7 g/dL — ABNORMAL LOW (ref 3.5–5.0)
Alkaline Phosphatase: 82 U/L (ref 38–126)
Anion gap: 9 (ref 5–15)
BUN: 21 mg/dL — ABNORMAL HIGH (ref 6–20)
CO2: 24 mmol/L (ref 22–32)
Calcium: 8.9 mg/dL (ref 8.9–10.3)
Chloride: 98 mmol/L (ref 98–111)
Creatinine, Ser: 0.92 mg/dL (ref 0.61–1.24)
GFR calc Af Amer: >60 mL/min (ref >60)
GFR calc non Af Amer: >60 mL/min (ref >60)
Glucose, Bld: 195 mg/dL — ABNORMAL HIGH (ref 70–99)
Potassium: 4.5 mmol/L (ref 3.5–5.1)
Sodium: 131 mmol/L — ABNORMAL LOW (ref 135–145)
Total Bilirubin: 0.5 mg/dL (ref 0.3–1.2)
Total Protein: 6.4 g/dL — ABNORMAL LOW (ref 6.5–8.1)

## 2019-04-06 LAB — GLUCOSE, CAPILLARY
Glucose-Capillary: 303 mg/dL — ABNORMAL HIGH (ref 70–99)
Glucose-Capillary: 195 mg/dL — ABNORMAL HIGH (ref 70–99)

## 2019-04-06 LAB — C-REACTIVE PROTEIN: CRP: 4.5 mg/dL — ABNORMAL HIGH (ref <1.0)

## 2019-04-06 LAB — CBC
HCT: 41.7 % (ref 39.0–52.0)
Hemoglobin: 15 g/dL (ref 13.0–17.0)
MCH: 29.5 pg (ref 26.0–34.0)
MCHC: 36 g/dL (ref 30.0–36.0)
MCV: 81.9 fL (ref 80.0–100.0)
Platelets: 300 10*3/uL (ref 150–400)
RBC: 5.09 MIL/uL (ref 4.22–5.81)
RDW: 13 % (ref 11.5–15.5)
WBC: 8.5 10*3/uL (ref 4.0–10.5)
nRBC: 0 % (ref 0.0–0.2)

## 2019-04-06 LAB — PROCALCITONIN: Procalcitonin: 1.09 ng/mL

## 2019-04-06 MED ORDER — PREDNISONE 20 MG PO TABS
40.0000 mg | ORAL_TABLET | Freq: Every day | ORAL | Status: DC
  Administered 2019-04-07 – 2019-04-08 (×2): 40 mg via ORAL
  Filled 2019-04-06 (×2): qty 2

## 2019-04-06 MED ORDER — PANTOPRAZOLE SODIUM 40 MG PO TBEC
40.0000 mg | DELAYED_RELEASE_TABLET | Freq: Two times a day (BID) | ORAL | Status: DC
  Administered 2019-04-06 – 2019-04-09 (×6): 40 mg via ORAL
  Filled 2019-04-06 (×6): qty 1

## 2019-04-06 NOTE — Progress Notes (Addendum)
Spoke to and updated Wife Lanelle Bal. Reviewed patient's condition, oxygen level, plan of care and current treatment. Discussed pt's anxiety and pt making excuse why he can't walk, why he cant sit in recliner, etc... Educated and advised pt to get OOB to recliner, to walk to bathroom, promoted self care. Oxygen remains at 6 LPM via [place]. Oxygen will reach into bathroom. Patient's wife stated that the oxygen that was delivered to her house was not set up correctly. I gave wife Huey Romans name and number to [place] office to call them about it.
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
## 2019-04-06 NOTE — Progress Notes (Signed)
Attempted to update wife, called left message, no return call yet

## 2019-04-06 NOTE — Progress Notes (Signed)
Pharmacy Antibiotic Note  Cameron Diaz is a 58 y.o. male admitted on 03/24/2019 with dyspnea after a previous hospitalization for COVID-19 PNA.  Pharmacy has been consulted for vancomycin and Zosyn dosing for possible bacterial PNA.  AF, WBC wnl, SCr 0.91 stable, BCx ngtd, MRSA PCR pending   Plan: -Continue vancomycin 1750 mg iv q 24h  -Continue Zosyn 3.375 g EI q 8h  -F/U MRSA PCR, culture results, renal function  -Levels as indicated   Height: 5\' 11"  (180.3 cm) Weight: 220 lb 7.4 oz (100 kg) IBW/kg (Calculated) : 75.3  Temp (24hrs), Avg:98 F (36.7 C), Min:97.5 F (36.4 C), Max:98.7 F (37.1 C)  Recent Labs  Lab 03/31/19 0305 04/01/19 0513 04/02/19 0208 04/03/19 0540 04/05/19 0130 04/06/19 0136  WBC 19.3*  --   --  15.9* 8.0 8.5  CREATININE 0.91 0.98 1.05 1.13 1.00 0.92    Estimated Creatinine Clearance: 105.5 mL/min (by C-G formula based on SCr of 0.92 mg/dL).    No Known Allergies  12/15 azithromycin >> 12/16  12/15 ceftriaxone >> 12/18  12/24 vancomycin >> 12/24 Zosyn >>  12/15 sputum - negative 12/24 MRSA PCR: sent 12/24 BCx: ngtd 12/24 SCx: sent   Thank you for allowing pharmacy to be a part of this patient's care.  Napoleon Form 04/06/2019 8:39 AM

## 2019-04-07 LAB — COMPREHENSIVE METABOLIC PANEL
ALT: 43 U/L (ref 0–44)
AST: 25 U/L (ref 15–41)
Albumin: 2.9 g/dL — ABNORMAL LOW (ref 3.5–5.0)
Alkaline Phosphatase: 83 U/L (ref 38–126)
Anion gap: 8 (ref 5–15)
BUN: 20 mg/dL (ref 6–20)
CO2: 26 mmol/L (ref 22–32)
Calcium: 8.8 mg/dL — ABNORMAL LOW (ref 8.9–10.3)
Chloride: 100 mmol/L (ref 98–111)
Creatinine, Ser: 0.89 mg/dL (ref 0.61–1.24)
GFR calc Af Amer: >60 mL/min (ref >60)
GFR calc non Af Amer: >60 mL/min (ref >60)
Glucose, Bld: 172 mg/dL — ABNORMAL HIGH (ref 70–99)
Potassium: 4.2 mmol/L (ref 3.5–5.1)
Sodium: 134 mmol/L — ABNORMAL LOW (ref 135–145)
Total Bilirubin: 1 mg/dL (ref 0.3–1.2)
Total Protein: 6.1 g/dL — ABNORMAL LOW (ref 6.5–8.1)

## 2019-04-07 LAB — GLUCOSE, CAPILLARY
Glucose-Capillary: 261 mg/dL — ABNORMAL HIGH (ref 70–99)
Glucose-Capillary: 118 mg/dL — ABNORMAL HIGH (ref 70–99)
Glucose-Capillary: 138 mg/dL — ABNORMAL HIGH (ref 70–99)
Glucose-Capillary: 261 mg/dL — ABNORMAL HIGH (ref 70–99)
Glucose-Capillary: 126 mg/dL — ABNORMAL HIGH (ref 70–99)
Glucose-Capillary: 154 mg/dL — ABNORMAL HIGH (ref 70–99)
Glucose-Capillary: 282 mg/dL — ABNORMAL HIGH (ref 70–99)
Glucose-Capillary: 305 mg/dL — ABNORMAL HIGH (ref 70–99)

## 2019-04-07 LAB — CBC
HCT: 40.3 % (ref 39.0–52.0)
Hemoglobin: 14.6 g/dL (ref 13.0–17.0)
MCH: 29.7 pg (ref 26.0–34.0)
MCHC: 36.2 g/dL — ABNORMAL HIGH (ref 30.0–36.0)
MCV: 82.1 fL (ref 80.0–100.0)
Platelets: 286 10*3/uL (ref 150–400)
RBC: 4.91 MIL/uL (ref 4.22–5.81)
RDW: 13.1 % (ref 11.5–15.5)
WBC: 12 10*3/uL — ABNORMAL HIGH (ref 4.0–10.5)
nRBC: 0 % (ref 0.0–0.2)

## 2019-04-07 LAB — C-REACTIVE PROTEIN: CRP: 1.6 mg/dL — ABNORMAL HIGH (ref <1.0)

## 2019-04-07 MED ORDER — AMOXICILLIN-POT CLAVULANATE 875-125 MG PO TABS
1.0000 | ORAL_TABLET | Freq: Two times a day (BID) | ORAL | Status: DC
  Administered 2019-04-07 – 2019-04-09 (×5): 1 via ORAL
  Filled 2019-04-07 (×6): qty 1
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
## 2019-04-07 NOTE — Progress Notes (Signed)
Pt walked about half way around the nurse's station on 3L O2 and was able to keep sats above 89%.

## 2019-04-07 NOTE — Plan of Care (Signed)
  Problem: Education: Goal: Knowledge of risk factors and measures for prevention of condition will improve Outcome: Progressing   Problem: Coping: Goal: Psychosocial and spiritual needs will be supported Outcome: Progressing   Problem: Respiratory: Goal: Will maintain a patent airway Outcome: Progressing Goal: Complications related to the disease process, condition or treatment will be avoided or minimized Outcome: Progressing   

## 2019-04-08 ENCOUNTER — Inpatient Hospital Stay (HOSPITAL_COMMUNITY): Payer: PPO

## 2019-04-08 LAB — COMPREHENSIVE METABOLIC PANEL
ALT: 65 U/L — ABNORMAL HIGH (ref 0–44)
AST: 33 U/L (ref 15–41)
Albumin: 2.8 g/dL — ABNORMAL LOW (ref 3.5–5.0)
Alkaline Phosphatase: 72 U/L (ref 38–126)
Anion gap: 10 (ref 5–15)
BUN: 15 mg/dL (ref 6–20)
CO2: 26 mmol/L (ref 22–32)
Calcium: 8.9 mg/dL (ref 8.9–10.3)
Chloride: 100 mmol/L (ref 98–111)
Creatinine, Ser: 0.76 mg/dL (ref 0.61–1.24)
GFR calc Af Amer: >60 mL/min (ref >60)
GFR calc non Af Amer: >60 mL/min (ref >60)
Glucose, Bld: 118 mg/dL — ABNORMAL HIGH (ref 70–99)
Potassium: 4.1 mmol/L (ref 3.5–5.1)
Sodium: 136 mmol/L (ref 135–145)
Total Bilirubin: 1.2 mg/dL (ref 0.3–1.2)
Total Protein: 6.1 g/dL — ABNORMAL LOW (ref 6.5–8.1)

## 2019-04-08 LAB — CULTURE, BLOOD (ROUTINE X 2)
Culture: NO GROWTH
Culture: NO GROWTH
Special Requests: ADEQUATE
Special Requests: ADEQUATE

## 2019-04-08 LAB — CBC
HCT: 40.4 % (ref 39.0–52.0)
Hemoglobin: 14.8 g/dL (ref 13.0–17.0)
MCH: 30.1 pg (ref 26.0–34.0)
MCHC: 36.6 g/dL — ABNORMAL HIGH (ref 30.0–36.0)
MCV: 82.1 fL (ref 80.0–100.0)
Platelets: 282 10*3/uL (ref 150–400)
RBC: 4.92 MIL/uL (ref 4.22–5.81)
RDW: 13.2 % (ref 11.5–15.5)
WBC: 13.2 10*3/uL — ABNORMAL HIGH (ref 4.0–10.5)
nRBC: 0 % (ref 0.0–0.2)

## 2019-04-08 LAB — GLUCOSE, CAPILLARY
Glucose-Capillary: 117 mg/dL — ABNORMAL HIGH (ref 70–99)
Glucose-Capillary: 174 mg/dL — ABNORMAL HIGH (ref 70–99)
Glucose-Capillary: 276 mg/dL — ABNORMAL HIGH (ref 70–99)
Glucose-Capillary: 280 mg/dL — ABNORMAL HIGH (ref 70–99)

## 2019-04-08 LAB — C-REACTIVE PROTEIN: CRP: 0.8 mg/dL (ref <1.0)

## 2019-04-08 MED ORDER — RIVAROXABAN 20 MG PO TABS: 20.0000 mg | ORAL_TABLET | Freq: Every day | ORAL | Status: DC

## 2019-04-08 MED ORDER — RIVAROXABAN 15 MG PO TABS
15.0000 mg | ORAL_TABLET | Freq: Two times a day (BID) | ORAL | Status: DC
  Administered 2019-04-08 – 2019-04-09 (×2): 15 mg via ORAL
  Filled 2019-04-08 (×4): qty 1

## 2019-04-08 MED ORDER — PREDNISONE 20 MG PO TABS
20.0000 mg | ORAL_TABLET | Freq: Every day | ORAL | Status: DC
  Administered 2019-04-09: 20 mg via ORAL
  Filled 2019-04-08: qty 1

## 2019-04-08 NOTE — Progress Notes (Signed)
Pt ambulated around about half of the nurses station on room air. Dropped below 90% for a very brief time but was able to recover and get sats above 90% with a break.
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
## 2019-04-09 LAB — GLUCOSE, CAPILLARY
Glucose-Capillary: 164 mg/dL — ABNORMAL HIGH (ref 70–99)
Glucose-Capillary: 212 mg/dL — ABNORMAL HIGH (ref 70–99)

## 2019-04-09 MED ORDER — ALPRAZOLAM 0.25 MG PO TABS: 0.2500 mg | ORAL_TABLET | Freq: Two times a day (BID) | ORAL | 0 refills | Status: DC | PRN

## 2019-04-09 MED ORDER — SACCHAROMYCES BOULARDII 250 MG PO CAPS: 250.0000 mg | ORAL_CAPSULE | Freq: Two times a day (BID) | ORAL | 0 refills | Status: AC

## 2019-04-09 MED ORDER — LISINOPRIL 20 MG PO TABS: 20.0000 mg | ORAL_TABLET | Freq: Every day | ORAL | 1 refills | Status: DC

## 2019-04-09 MED ORDER — RIVAROXABAN 20 MG PO TABS: 20.0000 mg | ORAL_TABLET | Freq: Every day | ORAL | 3 refills | Status: DC

## 2019-04-09 MED ORDER — ESCITALOPRAM OXALATE 10 MG PO TABS: 10.0000 mg | ORAL_TABLET | Freq: Every day | ORAL | 1 refills | Status: DC

## 2019-04-09 MED ORDER — PANTOPRAZOLE SODIUM 40 MG PO TBEC: 40.0000 mg | DELAYED_RELEASE_TABLET | Freq: Two times a day (BID) | ORAL | 0 refills | Status: DC

## 2019-04-09 MED ORDER — PREDNISONE 10 MG PO TABS: ORAL_TABLET | ORAL | 0 refills | Status: DC

## 2019-04-09 MED ORDER — AMOXICILLIN-POT CLAVULANATE 875-125 MG PO TABS: 1.0000 | ORAL_TABLET | Freq: Two times a day (BID) | ORAL | 0 refills | Status: AC

## 2019-04-09 MED ORDER — METOPROLOL TARTRATE 25 MG PO TABS: 25.0000 mg | ORAL_TABLET | Freq: Two times a day (BID) | ORAL | 2 refills | Status: DC

## 2019-04-09 MED ORDER — RIVAROXABAN (XARELTO) VTE STARTER PACK (15 & 20 MG): ORAL_TABLET | ORAL | 0 refills | Status: DC

## 2019-04-09 NOTE — Discharge Summary (Signed)
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 alcohol-based hand sanitizer if soap and water are not available and if your hands are not visibly dirty. Avoid touching your eyes, nose, and mouth with unwashed hands.  Directions for those who live with, or provide care at home for you:  Limit the number of people who have contact with the patient If possible, have only one caregiver for the patient. Other household members should stay in another home or place of residence. If this is not possible, they should stay in another room, or be separated from the patient as much as possible. Use a separate bathroom, if available. Restrict visitors who do not have an essential need to be in the home.  Ensure good ventilation Make sure that shared spaces in the home have good air flow, such as from an  air conditioner or an opened window, weather permitting.  Wash your hands often Wash your hands often and thoroughly with soap and water for at least 20 seconds. You can use an alcohol based hand sanitizer if soap and water are not available and if your hands are not visibly dirty. Avoid touching your eyes, nose, and mouth with unwashed hands. Use disposable paper towels to dry your hands. If not available, use dedicated cloth towels and replace them when they become wet.  Wear a facemask and gloves Wear a disposable facemask at all times in the room and gloves when you touch or have contact with the patient's blood, body fluids, and/or secretions or excretions, such as sweat, saliva, sputum, nasal mucus, vomit, urine, or feces.  Ensure the mask fits over your nose and mouth tightly, and do not touch it during use. Throw out disposable facemasks and gloves after using them. Do not reuse. Wash your hands immediately after removing your facemask and gloves. If your personal clothing becomes contaminated, carefully remove clothing and launder. Wash your hands after handling contaminated clothing. Place all used disposable facemasks, gloves, and other waste in a lined container before disposing them with other household waste. Remove gloves and wash your hands immediately after handling these items.  Do not share dishes, glasses, or other household items with the patient Avoid sharing household items. You should not share dishes, drinking glasses, cups, eating utensils, towels, bedding, or other items with a patient who is confirmed to have, or being evaluated for, COVID-19 infection. After the person uses these items, you should wash them thoroughly with soap and water.  Wash laundry thoroughly Immediately remove and wash clothes or bedding that have blood, body fluids, and/or secretions or excretions, such as sweat, saliva, sputum, nasal mucus, vomit, urine, or feces, on them. Wear gloves when  handling laundry from the patient. Read and follow directions on labels of laundry or clothing items and detergent. In general, wash and dry with the warmest temperatures recommended on the label.  Clean all areas the individual has used often Clean all touchable surfaces, such as counters, tabletops, doorknobs, bathroom fixtures, toilets, phones, keyboards, tablets, and bedside tables, every day. Also, clean any surfaces that may have blood, body fluids, and/or secretions or excretions on them. Wear gloves when cleaning surfaces the patient has come in contact with. Use a diluted bleach solution (e.g., dilute bleach with 1 part bleach and 10 parts water) or a household disinfectant with a label that says EPA-registered for coronaviruses. To make a bleach solution at home, add 1 tablespoon of bleach to 1 quart (4 cups) of water. For a larger supply, add  cup of bleach to 1 gallon (16 cups) of water. Read labels of cleaning products and follow recommendations provided on product labels. Labels contain instructions for safe and effective use of the cleaning product including precautions you should take when applying the product, such as wearing gloves or eye protection and making sure you have good ventilation during use of the product. Remove gloves and wash hands immediately after cleaning.  Monitor yourself for signs and symptoms of illness Caregivers and household members are considered close contacts, should monitor their health, and will be asked to limit movement outside of the home to the extent possible. Follow the monitoring steps for close contacts listed on the symptom monitoring form.   If you have additional questions, contact your local health department or call the epidemiologist on call at [phone] (available 24/7). This guidance is subject to change. For the most up-to-date guidance from [facility], please refer to their  website: [email or web address].pl   You were cared for by a hospitalist during your hospital stay. If you have any questions about your discharge medications or the care you received while you were in the hospital after you are discharged, you can call the unit and asked to speak with the hospitalist on call if the hospitalist that took care of you is not available. Once you are discharged, your primary care physician will handle any further medical issues. Please note that NO REFILLS for any discharge medications will be authorized once you are discharged, as it is imperative that you return to your primary care physician (or establish a relationship with a primary care physician if you do not have one) for your aftercare needs so that they can reassess your need for medications and monitor your lab values. If you do not have a primary care physician, you can call [phone] for a physician referral.   Increase activity slowly   Complete by: As directed         Allergies as of 04/09/2019   No Known Allergies     Medication List    STOP taking these medications   amLODipine 5 MG tablet Commonly known as: NORVASC   dexamethasone 6 MG tablet Commonly known as: Decadron     TAKE these medications   ALPRAZolam 0.25 MG tablet Commonly known as: XANAX Take 1 tablet (0.25 mg total) by mouth 2 (two) times daily as needed for up to 7 days for anxiety.   amoxicillin-clavulanate 875-125 MG tablet Commonly known as: AUGMENTIN Take 1 tablet by mouth every 12 (twelve) hours for 4 days.   benzonatate 100 MG capsule Commonly known as: TESSALON Take 1 capsule (100 mg total) by mouth every 8 (eight) hours.   chlorpheniramine-HYDROcodone 10-8 MG/5ML Suer Commonly known as: TUSSIONEX Take 5 mLs by mouth every 12 (twelve) hours as needed for cough.   escitalopram 10 MG tablet Commonly known as: LEXAPRO  Take 1 tablet (10 mg total) by mouth  daily. Start taking on: April 10, 2019   fluorometholone 0.1 % ophthalmic suspension Commonly known as: FML Place 1 drop into the left eye 4 (four) times daily.   lisinopril 20 MG tablet Commonly known as: ZESTRIL Take 1 tablet (20 mg total) by mouth daily. Start taking on: April 10, 2019 What changed:   medication strength  how much to take   metFORMIN 500 MG 24 hr tablet Commonly known as: GLUCOPHAGE-XR Take 1 tablet (500 mg total) by mouth at bedtime. What changed: when to take this   metoprolol tartrate 25 MG tablet Commonly known as: LOPRESSOR Take 1 tablet (25 mg total) by mouth 2 (two) times daily.   ONE TOUCH ULTRA 2 w/Device Kit 1 glucometer.  Patient to test fasting CBG once daily.   ONE TOUCH ULTRA TEST test strip Generic drug: glucose blood USE AS INSTRUCTED TO TEST CBG FASTING ONCE DAILY. What changed: See the new instructions.   pantoprazole 40 MG tablet Commonly known as: PROTONIX Take 1 tablet (40 mg total) by mouth 2 (two) times daily.   predniSONE 10 MG tablet Commonly known as: DELTASONE Take 2 tablets once daily for 3 days followed by 1 tablet once daily for 3 days and then stop   Rivaroxaban 15 & 20 MG Tbpk Follow package directions: Take one 73m tablet by mouth twice a day. On day 22, switch to one 263mtablet once a day. Take with food.   rivaroxaban 20 MG Tabs tablet Commonly known as: XARELTO Take 1 tablet (20 mg total) by mouth daily with supper. START ONLY AFTER YOU HAVE COMPLETED THE STARTER PACK. START ON 04/29/2018. Start taking on: April 30, 2019   saccharomyces boulardii 250 MG capsule Commonly known as: FLORASTOR Take 1 capsule (250 mg total) by mouth 2 (two) times daily for 7 days.   Voltaren 1 % Gel Generic drug: diclofenac Sodium Apply 4 g topically See admin instructions. Apply 4 grams to entire lower back ("hip to hip") two times a day        Follow-up Information    BePatriciaann ClanDO. Schedule an  appointment as soon as possible for a visit in 1 week(s).   Specialty: Family Medicine Contact information: [id number]. [phone]         TOTAL DISCHARGE TIME: 35 minutes  [facility] Pager on [email or web address]  04/09/2019, 6:21 PM

## 2019-04-09 NOTE — Care Management Important Message (Signed)
Important Message  Patient Details  Name: Cameron Diaz MRN: [id number] Date of Birth: 09-08-1960   Medicare Important Message Given:  Yes - Important Message mailed due to current National Emergency  Verbal consent obtained due to current National Emergency  Relationship to patient: Spouse/Significant Other Contact Name: Francesca Vos Call Date: 04/09/19  Time: 79 Phone: [phone] Outcome: No Answer/Busy Important Message mailed to: Patient address on file    Delorse Lek 04/09/2019, 10:19 AM

## 2019-04-11 ENCOUNTER — Telehealth: Payer: Self-pay | Admitting: Family Medicine

## 2019-04-11 DIAGNOSIS — U071 COVID-19: Secondary | ICD-10-CM | POA: Diagnosis not present

## 2019-04-11 NOTE — Telephone Encounter (Signed)
Received page on after hours line for this patient.  Patient did not answer. Left vm instructing to call back if he has any further concerns or questions.

## 2019-04-11 NOTE — Telephone Encounter (Signed)
Have now tried unsuccessfully to call this patient back three times after getting three separate after hours emergency line pages.  All three times left voicemail.  Last voicemail I left stated if pt is having a true emergency he should go to the emergency department, and that all other questions could be addressed by calling the office when we open back on Monday.

## 2019-04-12 ENCOUNTER — Encounter: Payer: Self-pay | Admitting: Family Medicine

## 2019-04-12 ENCOUNTER — Telehealth: Payer: Self-pay | Admitting: Family Medicine

## 2019-04-12 NOTE — Telephone Encounter (Signed)
**  After Hours/ Emergency Line Call**  Received a call to report that Cameron Diaz was calling.   Attempted to call patient, no response. VM left to either call us back or if true emergency to go to ED. Per chart review appears to have called on 04/10/18 as well with no response. I attempted to call wife listed under emergency contact, however, no response as well  Will forward to PCP to attempt to reach him   Caroline More, DO PGY-3, [facility] 04/12/2019 9:37 AM

## 2019-04-12 NOTE — Telephone Encounter (Signed)
**  After Hours/ Emergency Line Call**  Attempted to call patient again and still no response.  Have left a voicemail to either call back or if this was emergency to go to the ER.  I have also sent a MyChart message to patient informing him of these details.  Will forward to PCP to call patient.  Caroline More, DO PGY-3, [facility] 04/12/2019 11:33 AM

## 2019-04-14 ENCOUNTER — Telehealth: Payer: Self-pay | Admitting: Family Medicine

## 2019-04-14 ENCOUNTER — Other Ambulatory Visit: Payer: Self-pay | Admitting: Family Medicine

## 2019-04-14 ENCOUNTER — Other Ambulatory Visit: Payer: Self-pay

## 2019-04-14 ENCOUNTER — Telehealth: Payer: Self-pay

## 2019-04-14 DIAGNOSIS — E1165 Type 2 diabetes mellitus with hyperglycemia: Secondary | ICD-10-CM

## 2019-04-14 MED ORDER — GLUCOSE BLOOD VI STRP: ORAL_STRIP | 12 refills | Status: DC

## 2019-04-14 MED ORDER — ONETOUCH ULTRA VI STRP: ORAL_STRIP | 12 refills | Status: DC

## 2019-04-14 NOTE — Telephone Encounter (Signed)
Rx sent over for test strips. Need Diagnosis code and instructions to test 3 times daily on the Rx. Please resend new Rx.  I have called pt to find out what type of meter they have. Strips have never been filled at [facility]. Wife's mailbox is full.  If it has been a while we may want to send an Rx for a new meter as well. Please do this ASAP as pt is home from the hospital and has no way to check his sugars. Wife can be reached at [phone].Ottis Stain, CMA

## 2019-04-14 NOTE — Progress Notes (Signed)
Note wife called our clinic back, called his number once again with no answer.  Called [facility] to ensure the appropriate glucose test strips were sent in.  I resent in test strips (specifically stating One Touch ultra) with an appropriate diagnosis code and instructions, hopefully he should have no trouble getting this now.  [name], DO

## 2019-04-14 NOTE — Telephone Encounter (Signed)
Wife called back and is very upset that they are having this much trouble getting test strips.  Will forward to MD.  Cameron Diaz

## 2019-04-14 NOTE — Telephone Encounter (Signed)
Please see order note, sent in appropriate test strips.  Please let me know if they have any problems with receiving his glucose strips after this.  [name], DO

## 2019-04-14 NOTE — Telephone Encounter (Signed)
Called patient to touch base after he reached out to the after-hours line on 1/2 and our provider was not able to get a hold of him.  No answer and unable to leave voicemail as his mailbox is full.  Additionally called his spouse with no answer and left a voicemail.    Do note they called in earlier today to have a refill for his glucose test strips, this may have been the reasoning for call on 1/2.  I have already sent in a refill of his strips.  Will try to call them tomorrow.  [name], DO

## 2019-04-16 ENCOUNTER — Other Ambulatory Visit: Payer: Self-pay | Admitting: Family Medicine

## 2019-04-16 ENCOUNTER — Telehealth: Payer: Self-pay | Admitting: Family Medicine

## 2019-04-16 MED ORDER — METFORMIN HCL ER 500 MG PO TB24: 500.0000 mg | ORAL_TABLET | Freq: Every day | ORAL | 1 refills | Status: DC

## 2019-04-16 MED ORDER — ALPRAZOLAM 0.25 MG PO TABS: 0.2500 mg | ORAL_TABLET | Freq: Two times a day (BID) | ORAL | 0 refills | Status: AC | PRN

## 2019-04-16 NOTE — Telephone Encounter (Signed)
Patient wife called to request a refill on patients metformin and alprazolam. Patient was given the alprazolam while in the hospital for his anxiety since he has very bad anxiety after having COVID. He runs out of the alprazolam today and wife would like this refilled as soon as Dr. Higinio Plan gets a chance.

## 2019-04-16 NOTE — Telephone Encounter (Signed)
Prescribed Xanax during his hospital stay and started on Lexapro for anxiety.  Will refill Xanax to get him through until our appointment next week.  At that time we will discuss likely tapering vs continued prescription as appropriate.   [name], DO

## 2019-04-17 ENCOUNTER — Telehealth: Payer: Self-pay

## 2019-04-17 NOTE — Telephone Encounter (Signed)
Patient LVM on nurse line about high blood sugars. I called back, however had to LVM. Please set him up with a virtual apt if he is still having covid sxs or in window.

## 2019-04-18 ENCOUNTER — Other Ambulatory Visit: Payer: Self-pay

## 2019-04-18 ENCOUNTER — Telehealth (INDEPENDENT_AMBULATORY_CARE_PROVIDER_SITE_OTHER): Payer: PPO | Admitting: Student in an Organized Health Care Education/Training Program

## 2019-04-18 NOTE — Progress Notes (Signed)
[facility] Telemedicine Visit  Patient consented to have virtual visit. Method of visit: Telephone  Chief Complaint: elevated blood sugars.   HPI:  Attempted to call patient at time of appointment with no answer. Left a voicemail to call the clinic back to reschedule.   Appears that his recent A1c was elevated to 7.9 and is on 500mg  metformin daily. He could increase his medication to BID if able to tolerate.

## 2019-04-24 DIAGNOSIS — U071 COVID-19: Secondary | ICD-10-CM | POA: Diagnosis not present

## 2019-04-28 ENCOUNTER — Encounter: Payer: Self-pay | Admitting: Family Medicine

## 2019-04-28 ENCOUNTER — Other Ambulatory Visit: Payer: Self-pay

## 2019-04-28 ENCOUNTER — Ambulatory Visit (INDEPENDENT_AMBULATORY_CARE_PROVIDER_SITE_OTHER): Payer: PPO | Admitting: Family Medicine

## 2019-04-28 VITALS — BP 142/94 | HR 82 | Wt 222.8 lb

## 2019-04-28 DIAGNOSIS — E785 Hyperlipidemia, unspecified: Secondary | ICD-10-CM

## 2019-04-28 DIAGNOSIS — I824Z3 Acute embolism and thrombosis of unspecified deep veins of distal lower extremity, bilateral: Secondary | ICD-10-CM | POA: Diagnosis not present

## 2019-04-28 DIAGNOSIS — J9601 Acute respiratory failure with hypoxia: Secondary | ICD-10-CM

## 2019-04-28 DIAGNOSIS — I10 Essential (primary) hypertension: Secondary | ICD-10-CM | POA: Diagnosis not present

## 2019-04-28 DIAGNOSIS — F418 Other specified anxiety disorders: Secondary | ICD-10-CM

## 2019-04-28 DIAGNOSIS — I824Y3 Acute embolism and thrombosis of unspecified deep veins of proximal lower extremity, bilateral: Secondary | ICD-10-CM

## 2019-04-28 NOTE — Progress Notes (Signed)
Subjective:    Patient ID: Cameron Diaz, male    DOB: 1960-07-21, 59 y.o.   MRN: [id number]   CC: Covid hospital follow-up  HPI: Cameron Diaz is a 59 year old gentleman with hypertension, type 2 diabetes, psoriasis, hyperlipidemia, history of alcohol/drug abuse, and chronic pain followed by pain specialist presented to discuss the following:  Hospital follow-up: Admitted for acute hypoxic respiratory failure in the setting of COVID-19 pneumonia from 12/8-12/12 and then additionally from 12/14 to 04/09/2019.  His hospital course was complicated by bilateral lower extremity DVTs without evidence of PE for which he was started on Xarelto and concurrent aspiration pneumonia.  He was treated with remdesivir, steroids, vancomycin/Zosyn which was ultimately transitioned to Augmentin.  Sent home on 1-2 L supplemental oxygen at all times.  All inflammatory Covid markers trended down prior to discharge.  Recommended repeat CXR in 4 to 6 weeks from discharge.  Additionally he was noted to be extremely anxious during his hospitalization, was started on Lexapro and as needed Xanax.  For his hypertension and intermittent tachycardia, he was started on metoprolol and continued on his lisinopril.  His amlodipine was discontinued.  Today, he feels he is doing better since discharge, very slow but steady improvements.  Denies any chest pain, shortness of breath, leg swelling--however still feels winded after being too active.  He has not been using his oxygen very much at home, will add it if he is going to be walking for a good portion of time.  He has been walking back and forth from their porch to the mailbox throughout the day to help build up his endurance.  His wife on the phone is concerned about his next Xarelto.  He is due to start the 20 mg Xarelto on Friday, however they were never provided the coupon on hospital discharge, worried this will be very expensive.  Smoking status reviewed  Review of Systems  Per HPI    Objective:  BP (!) 142/94   Pulse 82   Wt 222 lb 12.8 oz (101.1 kg)   SpO2 93%   BMI 31.07 kg/m  Vitals and nursing note reviewed  General: NAD, pleasant, appears deconditioned/chronically ill Cardiac: RRR, normal heart sounds, no murmurs Respiratory: Unlabored breathing, sitting comfortably on room air.  Faint bibasilar crackle more prominent on the left, otherwise clear throughout all lung fields.  Ambulated with pulse ox, at rest maintaining O2 sats around 93-97%, after walking approximately 150 feet around the clinic desatted to 88-89% on room air. Abdomen: soft, nontender, nondistended Extremities: No lower extremity edema bilaterally, palpable dorsalis pedis pulses bilaterally.  Steady normal gait without assistance. Skin: warm and dry, no rashes noted Neuro: alert and oriented, no focal deficits Psych: normal affect  Assessment & Plan:   Acute respiratory failure with hypoxia (HCC) Improved, still requiring supplemental O2 with activity.  Hospital follow-up for COVID-19 and concurrent bacterial aspiration pneumonia, admitted from 12/8-12/12 and 12/14-12/30/2020.  Spent majority of visit discussing/reviewing new medications with him and his wife, will keep his new metoprolol, Lexapro, and as needed Xanax.  Discussed the importance of wearing his O2 with all activities and at rest if his SpO2 is <94%.  Encouraged him and congratulated him on steadily increasing his physical activity within his appropriate limits, recommend continued to do this.  Will get a follow-up CXR in approximately 2-3 weeks, order placed.   DVT, bilateral lower limbs ([place]) Visualized within the right gastrocnemius vein and left posterior tibialis vein on 03/26/2019, in the setting  of Covid hypercoagulability.  No PE on CTA. Started on Xarelto during his hospital stay and will transition to 20 mg with dinner on Friday, 1/22.  Cost is an issue, placed referral to our pharmacy team who has provided a 1  month supply and will review if there is any additional assistance we provide during the next several months.  Recommended a 59-month treatment course.  Anxiety about health Noted to be increasingly anxious during his hospital stay especially given the state of his health with Covid pneumonia.  Started on Lexapro and as needed Xanax 0.25mg  BID.  Discussed we can slowly taper off his Xanax as his Lexapro takes in effect.  He will try to decrease to 59 mg daily as needed, can discuss more on follow-up.  May need to increase his Lexapro to 20 mg on f/u.   Essential hypertension BP above goal today, 142/94. Maintained on his lisinopril and started on metoprolol due to concurrent tachycardia, amlodipine was discontinued during his stay.  Will keep regimen as is for now, may need to add back on his amlodipine on follow-up.  Hyperlipidemia Elevated ASCVD risk, have previously broached discussions on starting a statin, but he has been hesitant to do in the past.  Notably does have chronic pain and presumed liver cirrhosis.  Will readdress on follow-up.   Follow-up in approximately 2-3 weeks or sooner if needed, will obtain follow-up CXR prior to visit.  [facility] Resident PGY-2

## 2019-04-28 NOTE — Patient Instructions (Signed)
It was a wonderful seeing you today.  I recommend you wear your oxygen with walking/activity.  I will touch base with my pharmacy team to see what we can do about getting the next course of your Xarelto.  Please continue your medications as is.  I would like you to get a repeat chest x-ray at the [facility] imaging center in approximately 2-3 weeks, this is a walk-in and you can go at any time.  You are doing a wonderful job in trying to stay active and working towards recovery.  I would like you to follow-up in a few weeks so that we can see how you are progressing.

## 2019-04-29 ENCOUNTER — Telehealth: Payer: Self-pay | Admitting: Family Medicine

## 2019-04-29 NOTE — Chronic Care Management (AMB) (Signed)
  Chronic Care Management   Note  04/29/2019 Name: Cameron Diaz MRN: [id number] DOB: 1960/12/17  Cameron Diaz is a 59 y.o. year old male who is a primary care patient of [name], DO. I reached out to [employer] by phone today in response to a referral sent by Cameron Diaz's PCP, Darrelyn Hillock DO     Cameron Diaz was given information about Chronic Care Management services today including:  1. CCM service includes personalized support from designated clinical staff supervised by his physician, including individualized plan of care and coordination with other care providers 2. 24/7 contact phone numbers for assistance for urgent and routine care needs. 3. Service will only be billed when office clinical staff spend 20 minutes or more in a month to coordinate care. 4. Only one practitioner may furnish and bill the service in a calendar month. 5. The patient may stop CCM services at any time (effective at the end of the month) by phone call to the office staff. 6. The patient will be responsible for cost sharing (co-pay) of up to 20% of the service fee (after annual deductible is met).  Patient agreed to services and verbal consent obtained.   Follow up plan: Telephone appointment with care management team member scheduled for:05/02/2019  Glenna Durand, [facility] Management ??[email or web address] ??[phone]

## 2019-04-29 NOTE — Chronic Care Management (AMB) (Signed)
  Chronic Care Management   Outreach Note  04/29/2019 Name: MURILO PATTISON MRN: [id number] DOB: [birth year]  Rudy ABBEY FAHNER is a 59 y.o. year old male who is a primary care patient of [name], DO. I reached out to [employer] by phone today in response to a referral sent by Mr. Mohamadou O Wojdyla's PCP, Darrelyn Hillock DO     An unsuccessful telephone outreach was attempted today. The patient was referred to the case management team by for assistance with care management and care coordination.   Follow Up Plan: A HIPPA compliant phone message was left for the patient providing contact information and requesting a return call.  The care management team will reach out to the patient again over the next 7 days.  If patient returns call to provider office, please advise to call Embedded Care Management Care Guide Glenna Durand LPN at [phone]  Nickeah Allen, LPN Health Advisor, [facility] Management ??[email or web address] ??[phone]

## 2019-04-30 ENCOUNTER — Telehealth: Payer: Self-pay | Admitting: Pharmacist

## 2019-04-30 MED ORDER — RIVAROXABAN 20 MG PO TABS: 20.0000 mg | ORAL_TABLET | Freq: Every day | ORAL | 0 refills | Status: DC

## 2019-04-30 NOTE — Telephone Encounter (Signed)
Contacted patient RE continuation of Xarelto dosing.  He reports intermittent AM nose bleeds but these are manageable.   We discussed 20mg  dosing daily WITH evening meal starting Friday.  Medication Samples have been provided to the patient.  Drug name: Xarelto       Strength: 20mg         Qty: 28Tabs  LOT: 19MG [id number]  Exp.Date: 01/07/2021  Dosing instructions: With evening meal - one daily.  The patient has been instructed regarding the correct time, dose, and frequency of taking this medication, including desired effects and most common side effects.   Janeann Forehand 2:40 PM 04/30/2019  Patient verbalized understanding of treatment plan AND plans to pick up medication later today.

## 2019-04-30 NOTE — Telephone Encounter (Signed)
Thank you so much Dr. Koval! 

## 2019-04-30 NOTE — Telephone Encounter (Signed)
Noted and agree. 

## 2019-05-01 ENCOUNTER — Ambulatory Visit: Payer: Self-pay | Admitting: Pharmacist

## 2019-05-01 ENCOUNTER — Encounter: Payer: Self-pay | Admitting: Family Medicine

## 2019-05-01 ENCOUNTER — Other Ambulatory Visit: Payer: Self-pay | Admitting: Family Medicine

## 2019-05-01 ENCOUNTER — Other Ambulatory Visit: Payer: Self-pay

## 2019-05-01 DIAGNOSIS — I82403 Acute embolism and thrombosis of unspecified deep veins of lower extremity, bilateral: Secondary | ICD-10-CM

## 2019-05-01 DIAGNOSIS — F418 Other specified anxiety disorders: Secondary | ICD-10-CM | POA: Insufficient documentation

## 2019-05-01 HISTORY — DX: Acute embolism and thrombosis of unspecified deep veins of lower extremity, bilateral: I82.403

## 2019-05-01 NOTE — Assessment & Plan Note (Signed)
BP above goal today, 142/94. Maintained on his lisinopril and started on metoprolol due to concurrent tachycardia, amlodipine was discontinued during his stay.  Will keep regimen as is for now, may need to add back on his amlodipine on follow-up.

## 2019-05-01 NOTE — Assessment & Plan Note (Signed)
Improved, still requiring supplemental O2 with activity.  Hospital follow-up for COVID-19 and concurrent bacterial aspiration pneumonia, admitted from 12/8-12/12 and 12/14-12/30/2020.  Spent majority of visit discussing/reviewing new medications with him and his wife, will keep his new metoprolol, Lexapro, and as needed Xanax.  Discussed the importance of wearing his O2 with all activities and at rest if his SpO2 is <94%.  Encouraged him and congratulated him on steadily increasing his physical activity within his appropriate limits, recommend continued to do this.  Will get a follow-up CXR in approximately 2-3 weeks, order placed.

## 2019-05-01 NOTE — Assessment & Plan Note (Addendum)
Visualized within the right gastrocnemius vein and left posterior tibialis vein on 03/26/2019, in the setting of Covid hypercoagulability.  No PE on CTA. Started on Xarelto during his hospital stay and will transition to 20 mg with dinner on Friday, 1/22.  Cost is an issue, placed referral to our pharmacy team who has provided a 1 month supply and will review if there is any additional assistance we provide during the next several months.  Recommended a 65-month treatment course.

## 2019-05-01 NOTE — Assessment & Plan Note (Signed)
Noted to be increasingly anxious during his hospital stay especially given the state of his health with Covid pneumonia.  Started on Lexapro and as needed Xanax 0.25mg  BID.  Discussed we can slowly taper off his Xanax as his Lexapro takes in effect.  He will try to decrease to 0.25 mg daily as needed, can discuss more on follow-up.  May need to increase his Lexapro to 20 mg on f/u.

## 2019-05-01 NOTE — Assessment & Plan Note (Signed)
Elevated ASCVD risk, have previously broached discussions on starting a statin, but he has been hesitant to do in the past.  Notably does have chronic pain and presumed liver cirrhosis.  Will readdress on follow-up.

## 2019-05-02 ENCOUNTER — Ambulatory Visit: Payer: PPO

## 2019-05-02 ENCOUNTER — Other Ambulatory Visit: Payer: Self-pay

## 2019-05-02 DIAGNOSIS — I824Y3 Acute embolism and thrombosis of unspecified deep veins of proximal lower extremity, bilateral: Secondary | ICD-10-CM

## 2019-05-02 NOTE — Progress Notes (Signed)
  Chronic Care Management   Outreach Note  05/02/2019 Name: Cameron Diaz MRN: [id number] DOB: March 17, 1961  Referred by: [name], DO Reason for referral : Chronic Care Management  Successful outreach to Mr. & Ms. Bala regarding anticoagulation for recent VTE.  Patient was given Xarelto 21-day starter pack followed by 1 month of samples.  Patient has HTA medicare which means $45/month or $90/107months for a DOAC.  Medicare doesn't allow for coupons/copay cards (only 1st fill).    Patient duration of therapy likely 3-6 months (first event/provoked).  MD messaged to clarify duration.  If patient only requires 46-months, will provide samples to complete treatment.  Follow Up Plan: The care management team will reach out to the patient again over the next 10 days.   Regina Eck, PharmD, BCPS Clinical Pharmacist, [place]: [phone]

## 2019-05-08 ENCOUNTER — Other Ambulatory Visit: Payer: Self-pay | Admitting: *Deleted

## 2019-05-08 NOTE — Progress Notes (Signed)
  Chronic Care Management   Outreach Note  05/01/2019 Name: Cameron Diaz MRN: [id number] DOB: 22-Nov-1960  Referred by: [name], DO Reason for referral : No chief complaint on file.   An unsuccessful telephone outreach was attempted today. The patient was referred to the case management team by for assistance with care management and care coordination.   Follow Up Plan: A HIPPA compliant phone message was left for the patient providing contact information and requesting a return call.  The care management team will reach out to the patient again over the next 5 days.   SIGNATURE Regina Eck, PharmD, BCPS Clinical Pharmacist, [place]: [phone]

## 2019-05-09 MED ORDER — PANTOPRAZOLE SODIUM 40 MG PO TBEC: 40.0000 mg | DELAYED_RELEASE_TABLET | Freq: Two times a day (BID) | ORAL | 0 refills | Status: DC

## 2019-05-13 ENCOUNTER — Telehealth: Payer: Self-pay | Admitting: Pharmacist

## 2019-05-13 ENCOUNTER — Telehealth: Payer: PPO | Admitting: Pharmacist

## 2019-05-13 ENCOUNTER — Other Ambulatory Visit: Payer: Self-pay | Admitting: Pharmacist

## 2019-05-13 ENCOUNTER — Other Ambulatory Visit: Payer: Self-pay

## 2019-05-13 NOTE — Telephone Encounter (Signed)
-  Patient requesting refills for Tussionex--still having "terrible" cough post COVID, no fever, encouraged patient to schedule OV if continues.  -Patient also needs 90-day refill for Xarelto. Samples given to complete 65-months of treatment.  Will need 90-day RX called into [facility] to complete 71-months.  Copay is $90 for 2-month supply (patient & wife verbalize understanding)  -Patient also asking if he still needs to be taking pantoprazole--denies heartburn/GERD SX  Thank you! Almyra Free

## 2019-05-14 ENCOUNTER — Telehealth: Payer: Self-pay

## 2019-05-14 DIAGNOSIS — R059 Cough, unspecified: Secondary | ICD-10-CM

## 2019-05-14 DIAGNOSIS — R05 Cough: Secondary | ICD-10-CM

## 2019-05-14 MED ORDER — RIVAROXABAN 20 MG PO TABS: 20.0000 mg | ORAL_TABLET | Freq: Every day | ORAL | 0 refills | Status: DC

## 2019-05-14 MED ORDER — HYDROCOD POLST-CPM POLST ER 10-8 MG/5ML PO SUER: 5.0000 mL | Freq: Two times a day (BID) | ORAL | 0 refills | Status: DC | PRN

## 2019-05-14 NOTE — Telephone Encounter (Signed)
Patient calls nurse line stating [facility] does not have Tussionex in stock and needs sent to another pharmacy. I called [facility] to clarify and cancel out prescription. Patient would like [facility] [address]. Please advise.

## 2019-05-14 NOTE — Telephone Encounter (Signed)
Thank you SO MUCH Page. I have sent the Tussionex to [place], DO

## 2019-05-14 NOTE — Telephone Encounter (Signed)
Attempted to call patient concerning RX's.  LVM for patient to call office.  [name], [place]

## 2019-05-14 NOTE — Telephone Encounter (Signed)
Please let patient know: Tussionex refilled, will not be a long term prescription. Also sent [facility]arel[facility] to [facility]. Lastly, he does not need to continue protonix. Thank you!   [name], DO

## 2019-05-25 DIAGNOSIS — U071 COVID-19: Secondary | ICD-10-CM | POA: Diagnosis not present

## 2019-05-29 NOTE — Progress Notes (Signed)
CHIEF COMPLAINT / HPI: Follow-up Covid  [name] is a 59 year old gentleman presenting discussed the following:  Follow-up Covid pneumonia/DVT: Last seen on 1/18 for hospital follow-up due to acute hypoxic respiratory failure in the setting of Covid pneumonia from 12/8-12/12 and 12/14 to 04/09/2019.  At that time he was still requiring oxygen with activity.  Today he presents for follow-up, appears much more energized and breathing well.  States he is feeling better, but still feels a little deconditioned, occasionally will get out of breath when he works himself too hard.  Still has cough which has been frustrating, is better with antitussives.  He has been trying to stay active with now walking around the grocery store and doing chores around the house.  Checking pulse ox intermittently, around mid 90s, has not gone below 90 with rest or activity the past several weeks.  Taking Xarelto as prescribed for DVTs.  Adjustment disorder: Still feeling a little down after extensive hospital stay and repercussions, started on Lexapro 10 mg during his hospital stay.  He mainly attributes this to not being in his regular routine, he is hoping once he gets back to church that things will be much better as that is normally his safe space.  Unfortunately did not receive the letter we mailed him, requesting a letter to state he is medically cleared to go back to church today.  Denies any SI or HI.  Thinks therapy would be helpful for him.  PHQ score 8 (0 to #9), GAD-7 score 1, reports not difficult at all for him to manage.  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, recent COVID-19 pneumonia requiring hospitalization in 03/2019, T2DM, psoriasis, distant history of drug/alcohol use, hyperlipidemia   OBJECTIVE: BP (!) 145/80   Pulse 91   Wt 224 lb 6.4 oz (101.8 kg)   SpO2 95%   BMI 31.30 kg/m   General: Alert, NAD HEENT: NCAT, MMM Cardiac: RRR no m/g/r Lungs: Clear bilaterally, no increased WOB,  satting well on RA, able to walk perimeter of clinic at brisk walking pace with pulse ox without any oxygen saturations, SPO2 remained over 94% Abdomen: soft, non-tender, non-distended, normoactive BS Msk: Moves all extremities spontaneously  Ext: Warm, dry, 2+ distal pulses, no edema   ASSESSMENT / PLAN:  Pneumonia due to COVID-19 virus 03/2019 requiring hospitalization, steadily recovering. No longer requiring oxygen with activity or rest.  Will obtain repeat CXR to ensure resolution.  Provided with letter to return to church.  DVT, bilateral lower limbs ([place]) Continue with Xarelto for 6 months total course.  Will check CBC.  Hyperlipidemia Last LDL 101 in 2019.  Will recheck lipid panel today.  Elevated ASCVD 10-year risk, we have previously broached discussions on starting a statin however he has been hesitant to do so.  Will readdress on follow-up.  Adjustment disorder with depressed mood In the setting of significant illness with COVID in 03/2019, while on the steady recovery, he is frustrated to continue being out of his normal routine.  He is hopeful as he continues to improve and gets back to his baseline that he will do better.  Interested in therapy during this time, provided with resources.  Will continue Lexapro as is and given letter to return to church which is his safe space.   Essential hypertension BP above goal today, 145/80.  Maintained on lisinopril and started metoprolol during recent stay. His previous Norvasc was discontinued during his hospital stay, likely will add this back on  follow-up.  Will have patient monitor BP at home, if persistently elevated will have him add his Norvasc.    Follow-up in 1 month or sooner if needed for above   [name], [place]

## 2019-05-30 ENCOUNTER — Other Ambulatory Visit: Payer: Self-pay

## 2019-05-30 ENCOUNTER — Encounter: Payer: Self-pay | Admitting: Family Medicine

## 2019-05-30 ENCOUNTER — Ambulatory Visit (INDEPENDENT_AMBULATORY_CARE_PROVIDER_SITE_OTHER): Payer: PPO | Admitting: Family Medicine

## 2019-05-30 VITALS — BP 145/80 | HR 91 | Wt 224.4 lb

## 2019-05-30 DIAGNOSIS — J1282 Pneumonia due to coronavirus disease 2019: Secondary | ICD-10-CM

## 2019-05-30 DIAGNOSIS — Z8701 Personal history of pneumonia (recurrent): Secondary | ICD-10-CM | POA: Diagnosis not present

## 2019-05-30 DIAGNOSIS — Z7901 Long term (current) use of anticoagulants: Secondary | ICD-10-CM | POA: Diagnosis not present

## 2019-05-30 DIAGNOSIS — E119 Type 2 diabetes mellitus without complications: Secondary | ICD-10-CM | POA: Diagnosis not present

## 2019-05-30 DIAGNOSIS — Z8616 Personal history of COVID-19: Secondary | ICD-10-CM

## 2019-05-30 DIAGNOSIS — I824Z3 Acute embolism and thrombosis of unspecified deep veins of distal lower extremity, bilateral: Secondary | ICD-10-CM

## 2019-05-30 DIAGNOSIS — F4321 Adjustment disorder with depressed mood: Secondary | ICD-10-CM | POA: Insufficient documentation

## 2019-05-30 DIAGNOSIS — Z86718 Personal history of other venous thrombosis and embolism: Secondary | ICD-10-CM | POA: Diagnosis not present

## 2019-05-30 DIAGNOSIS — E785 Hyperlipidemia, unspecified: Secondary | ICD-10-CM

## 2019-05-30 DIAGNOSIS — I10 Essential (primary) hypertension: Secondary | ICD-10-CM

## 2019-05-30 HISTORY — DX: Adjustment disorder with depressed mood: F43.21

## 2019-05-30 NOTE — Assessment & Plan Note (Signed)
03/2019 requiring hospitalization, steadily recovering. No longer requiring oxygen with activity or rest.  Will obtain repeat CXR to ensure resolution.  Provided with letter to return to church.

## 2019-05-30 NOTE — Assessment & Plan Note (Signed)
Last LDL 101 in 2019.  Will recheck lipid panel today.  Elevated ASCVD 10-year risk, we have previously broached discussions on starting a statin however he has been hesitant to do so.  Will readdress on follow-up.

## 2019-05-30 NOTE — Assessment & Plan Note (Signed)
Continue with Xarelto for 6 months total course.  Will check CBC.

## 2019-05-30 NOTE — Patient Instructions (Signed)
It was a wonderful seeing you today.  You are looking so much more energized and breathing better today!  Your oxygen remained good while you are walking, please check the pulse ox periodically to make sure it is not dipping down.  If you have any difficulty breathing or chest pain, please go to the emergency room.  Please make sure within the next week or so that you go to the [facility] imaging center to get that chest x-ray done.  We will also check some labs today to see how your cholesterol, kidney function, and blood level are.  I will let you know the results in the next several days.  Lastly, I understand this is been overwhelming time and you just hoping to get back to normal.  There are some therapy resources to reach out to to help you get through this time as you start picking your feet back up.    Therapy and Counseling Resources Most providers on this list will take Medicaid. Patients with commercial insurance or Medicare should contact their insurance company to get a list of in network providers.  [employer]  [address], [place], [place] [address]      [phone]  [place] [address] [place], [place] [address]       [place] [address], [place], [place]    Jinny Blossom [facility] [address] [place], [place]  [facility]:  [place]. [place]  [facility]:  [address] Loni Muse, [place]  [facility] (under & uninsured) [address], [place] [phone]    [email or web address]    [facility] [place]     Phone:  [phone]     [facility] [place]  [place] [address]      [place], [place] ext  [place]: [place], [place], [place]   [place] ([place] therapist) [address]   [place] [address]    [phone]    [facility]   [place],  [place], [place]   [place]  [place]  [facility]  [name] [place], [place]        [phone]  Open Access/Walk In Clinic under & uninsured [place],  [address] [place] [phone]):  Mon - Fri from 8 AM - 3 PM  [facility] [place],  ([place])   [place], [place]: [phone]) 8:30 - 12; 1 - 2:30  [facility] of the [place],  [place], [place]    [phone]):8:30 - 12; 2 - 3PM  [facility] [employer],  [address],  [place]; [phone]):   Mon - Fri 8 AM - 5 PM  [facility] [place]  MWF 12:30 to 3:00 or call to schedule an appointment  [phone]  Specific Provider options Psychology Today  [email or web address].[email or web address]/us 1. click on find a therapist  2. enter your zip code 3. left side and select or tailor a therapist for your specific need.  [facility] Provider Directory [email or web address]  (Medicaid)   Follow all drop down to find a provider  [place] or [email or web address] [address], [place], [place] Recovery support and educational   In home counseling [place] Telephone: [phone]  office in [place] [email or web address]   Does not take reg. Medicaid or Medicare private insurance [facility], [place] health Choice, [facility], [place], [place], [place], [place] Health Choice  24- Hour Availability:  . [place] or [phone]  . Family Service of the [employer]  [phone]  [facility] Crisis Service  [phone]   . [place]  [phone] (after hours)  . Therapeutic Alternative/Mobile Crisis   [phone]  . [place] National Suicide Hotline  [phone] ([place])  . Call 911 or go to emergency room  . [employer]  [phone]);  [place] and [employer]   . [employer] ACCESS  [phone]); [place], [place], [place], [place], [place], [place], [place]

## 2019-05-30 NOTE — Assessment & Plan Note (Signed)
In the setting of significant illness with COVID in 03/2019, while on the steady recovery, he is frustrated to continue being out of his normal routine.  He is hopeful as he continues to improve and gets back to his baseline that he will do better.  Interested in therapy during this time, provided with resources.  Will continue Lexapro as is and given letter to return to church which is his safe space.

## 2019-05-30 NOTE — Assessment & Plan Note (Signed)
BP above goal today, 145/80.  Maintained on lisinopril and started metoprolol during recent stay. His previous Norvasc was discontinued during his hospital stay, likely will add this back on follow-up.  Will have patient monitor BP at home, if persistently elevated will have him add his Norvasc.

## 2019-05-31 LAB — CBC
Hematocrit: 48 % (ref 37.5–51.0)
Hemoglobin: 16.4 g/dL (ref 13.0–17.7)
MCH: 29.7 pg (ref 26.6–33.0)
MCHC: 34.2 g/dL (ref 31.5–35.7)
MCV: 87 fL (ref 79–97)
Platelets: 273 10*3/uL (ref 150–450)
RBC: 5.53 x10E6/uL (ref 4.14–5.80)
RDW: 13.8 % (ref 11.6–15.4)
WBC: 6.1 10*3/uL (ref 3.4–10.8)

## 2019-05-31 LAB — BASIC METABOLIC PANEL
BUN/Creatinine Ratio: 7 — ABNORMAL LOW (ref 9–20)
BUN: 6 mg/dL (ref 6–24)
CO2: 19 mmol/L — ABNORMAL LOW (ref 20–29)
Calcium: 9.6 mg/dL (ref 8.7–10.2)
Chloride: 106 mmol/L (ref 96–106)
Creatinine, Ser: 0.87 mg/dL (ref 0.76–1.27)
GFR calc Af Amer: 110 mL/min/{1.73_m2} (ref >59)
GFR calc non Af Amer: 95 mL/min/{1.73_m2} (ref >59)
Glucose: 146 mg/dL — ABNORMAL HIGH (ref 65–99)
Potassium: 4.3 mmol/L (ref 3.5–5.2)
Sodium: 139 mmol/L (ref 134–144)

## 2019-05-31 LAB — LIPID PANEL
Chol/HDL Ratio: 4.5 ratio (ref 0.0–5.0)
Cholesterol, Total: 199 mg/dL (ref 100–199)
HDL: 44 mg/dL (ref >39)
LDL Chol Calc (NIH): 110 mg/dL — ABNORMAL HIGH (ref 0–99)
Triglycerides: 263 mg/dL — ABNORMAL HIGH (ref 0–149)
VLDL Cholesterol Cal: 45 mg/dL — ABNORMAL HIGH (ref 5–40)

## 2019-06-05 ENCOUNTER — Telehealth: Payer: Self-pay | Admitting: *Deleted

## 2019-06-05 NOTE — Patient Outreach (Signed)
Telephone outreach. Have tried to call Mr. Lamberti on 2 occasions without success. Was able to leave 2 messages and requested a return call. Sent Dr. Higinio Plan an in box message and she said she would like pt to be engaged and will try to get pt to agree to care management.  [name], MSN, [facility] Gerontological Nurse Practitioner [facility] Care Management [phone]

## 2019-06-06 ENCOUNTER — Other Ambulatory Visit: Payer: Self-pay | Admitting: *Deleted

## 2019-06-06 ENCOUNTER — Telehealth: Payer: Self-pay | Admitting: Family Medicine

## 2019-06-06 NOTE — Patient Outreach (Signed)
Incoming call from Mr. Iwen today.  Educated pt on my role with HTA as a patient advocate. We had a lengthy discussion. Pt's wife was also on the phone and added to the conversation. Pt was forthcoming with information. He said Dr. Higinio Plan has given him a list of mental health resouces but he has not called anyone to date.   He reports he had to be hospitalized twice for his COVID infection and it really took it's toll on him. His wife, her grandmother and a grandchild that are in the household never got the infection.  Advised that there are several other people on the HTA team that are available to him including nurses and a Education officer, museum. HIs wife spoke up right away and said they do need a Education officer, museum.   Encouraged pt to choose 3 places off the mental health list and call those to see if he can be seen and establish care with someone.  Provided social worker's phone number.  I will call back in 2 weeks.  [name], MSN, [facility] Gerontological Nurse Practitioner [facility] Care Management [phone]

## 2019-06-06 NOTE — Telephone Encounter (Signed)
Call patient to discuss his results from recent visit as noted he has not reviewed my MyChart message and additionally trying to see if he would like to engage with care management.  No answer, left voicemail.  If he calls the clinic, please inform him to check his MyChart and provide him with care management number, Caroll Spinks, in the most recent telephone visit.  [name], DO

## 2019-06-06 NOTE — Telephone Encounter (Signed)
Patient returns call- I advised him of [name] attempts to call him. [place] number was given to patient. He was appreciative.

## 2019-06-13 ENCOUNTER — Ambulatory Visit (INDEPENDENT_AMBULATORY_CARE_PROVIDER_SITE_OTHER): Payer: PPO | Admitting: Family Medicine

## 2019-06-13 ENCOUNTER — Encounter: Payer: Self-pay | Admitting: Family Medicine

## 2019-06-13 ENCOUNTER — Other Ambulatory Visit: Payer: Self-pay

## 2019-06-13 ENCOUNTER — Ambulatory Visit (HOSPITAL_COMMUNITY)
Admission: RE | Admit: 2019-06-13 | Discharge: 2019-06-13 | Disposition: A | Discharge status: Home / Self Care | Payer: PPO | Source: Ambulatory Visit | Attending: Family Medicine | Admitting: Family Medicine

## 2019-06-13 VITALS — BP 146/82 | HR 120 | Wt 226.0 lb

## 2019-06-13 DIAGNOSIS — R000 Tachycardia, unspecified: Secondary | ICD-10-CM | POA: Insufficient documentation

## 2019-06-13 DIAGNOSIS — E785 Hyperlipidemia, unspecified: Secondary | ICD-10-CM | POA: Diagnosis not present

## 2019-06-13 DIAGNOSIS — G479 Sleep disorder, unspecified: Secondary | ICD-10-CM

## 2019-06-13 DIAGNOSIS — U071 COVID-19: Secondary | ICD-10-CM

## 2019-06-13 DIAGNOSIS — I10 Essential (primary) hypertension: Secondary | ICD-10-CM | POA: Diagnosis not present

## 2019-06-13 DIAGNOSIS — J1282 Pneumonia due to coronavirus disease 2019: Secondary | ICD-10-CM | POA: Diagnosis not present

## 2019-06-13 DIAGNOSIS — E119 Type 2 diabetes mellitus without complications: Secondary | ICD-10-CM | POA: Diagnosis not present

## 2019-06-13 HISTORY — DX: Sleep disorder, unspecified: G47.9

## 2019-06-13 HISTORY — DX: Tachycardia, unspecified: R00.0

## 2019-06-13 LAB — POCT GLYCOSYLATED HEMOGLOBIN (HGB A1C): HbA1c, POC (controlled diabetic range): 6.5 % (ref 0.0–7.0)

## 2019-06-13 MED ORDER — ROSUVASTATIN CALCIUM 10 MG PO TABS: 10.0000 mg | ORAL_TABLET | Freq: Every day | ORAL | 0 refills | Status: DC

## 2019-06-13 NOTE — Assessment & Plan Note (Addendum)
HR 105-120 during today's visit.  Asymptomatic without additional concerning symptomatology.  EKG sinus and similar to previous.  Did not take his metoprolol this morning, suspect reflex tachycardia with subsequent exertion.

## 2019-06-13 NOTE — Assessment & Plan Note (Signed)
Last A1c 7.3 in 03/2019.  Will recheck A1c today and consider increasing his Metformin from 500 mg to 1000 mg daily pending value.

## 2019-06-13 NOTE — Assessment & Plan Note (Signed)
Discussed appropriate sleep hygiene and initiation of melatonin 2-3 hours prior to bedtime.  Handout provided.

## 2019-06-13 NOTE — Assessment & Plan Note (Signed)
Associated with elevated ASCVD 10-year risk.  Starting on Crestor for primary prevention today, will titrate up to 20 mg over the next several weeks.

## 2019-06-13 NOTE — Progress Notes (Signed)
    SUBJECTIVE:   CHIEF COMPLAINT / HPI: Follow-up  Cameron Diaz is a 59 yo gentleman presenting to discuss the following:    Follow-up Covid PNA/DVT: Last seen on 2/19 and was significantly improved at that time. Doing well, still some dyspnea with exertion, but slowly improving. Rarely needing oxygen. Returned to church this past Sunday, did well.   Hypertension:  Reasonable control. Taking lisinopril 20mg , metoprolol, and norvasc 5mg  at home. Did not take his medicines this morning (usually takes the metop in the am). Denies any CP, SOB, dizziness/lightheadedness. No end organ damage.   Hyperlipidemia: Elevated ASCVD 10 year risk, should be on a statin. Interested in starting this today.   Diabetes: Last A1c 7.3 in 03/2019.  Currently taking Metformin XR 500 mg daily.  Difficulty sleeping: Reports he isnt going to sleep until around 1 am and then waking up frequently. Doesn't sleep with Tv, but watches soon before bed. Used to use his cough syrup to help him sleep.   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, recent COVID-19 pneumonia requiring hospitalization in 03/2019, type 2 diabetes, psoriasis, drug/alcohol use in the distant history, hyperlipidemia  OBJECTIVE:   BP (!) 146/82   Pulse (!) 120   Wt 226 lb (102.5 kg)   SpO2 96%   BMI 31.52 kg/m   General: Alert, NAD HEENT: NCAT, MMM Cardiac: Regular rhythm, tachycardic  no m/g/r Lungs: Clear bilaterally, no increased WOB  Abdomen: soft, non-tender Msk: Moves all extremities spontaneously  Ext: Warm, dry, 2+ distal pulses, no edema   ASSESSMENT/PLAN:   Essential hypertension Currently managed on lisinopril 20 mg, metoprolol, and Norvasc 5 mg with borderline control.  Will go ahead and increase Norvasc to 10 mg.  Hyperlipidemia Associated with elevated ASCVD 10-year risk.  Starting on Crestor for primary prevention today, will titrate up to 20 mg over the next several weeks.  Pneumonia due to COVID-19 virus Continue  to improve clinically.  Awaiting repeat CXR for resolution.  Sinus tachycardia HR 105-120 during today's visit.  Asymptomatic without additional concerning symptomatology.  EKG sinus and similar to previous.  Did not take his metoprolol this morning, suspect reflex tachycardia with subsequent exertion.  DM (diabetes mellitus), type 2 (HCC) Last A1c 7.3 in 03/2019.  Will recheck A1c today and consider increasing his Metformin from 500 mg to 1000 mg daily pending value.  Difficulty sleeping Discussed appropriate sleep hygiene and initiation of melatonin 2-3 hours prior to bedtime.  Handout provided.    Follow up in 1 month or sooner if needed.   [name], [place]

## 2019-06-13 NOTE — Patient Instructions (Addendum)
Wonderful to see you today! Please increase your Norvasc to 10mg  daily. Additionally we are going to start you on crestor, this medication helps reduce your risk of heart attacks and strokes. See med instructions to titrate up slowly. Make sure you are keeping your legs well lotioned and avoid itching these areas.   Good sleep habits (sometimes referred to as "sleep hygiene") can help you get a good night's sleep. Some habits that can improve your sleep health:  Be consistent. Go to bed at the same time each night and get up at the same time each morning, including on the weekends Make sure your bedroom is quiet, dark, relaxing, and at a comfortable temperature Remove electronic devices, such as TVs, computers, and smart phones, from the bedroom Avoid large meals, caffeine, and alcohol before bedtime Get some exercise. Being physically active during the day can help you fall asleep more easily at night. You may also try melatonin--take two hours before bedtime. Can increase to 10mg  if needed.

## 2019-06-13 NOTE — Assessment & Plan Note (Signed)
Continue to improve clinically.  Awaiting repeat CXR for resolution.

## 2019-06-13 NOTE — Assessment & Plan Note (Signed)
Currently managed on lisinopril 20 mg, metoprolol, and Norvasc 5 mg with borderline control.  Will go ahead and increase Norvasc to 10 mg.

## 2019-06-19 ENCOUNTER — Other Ambulatory Visit: Payer: Self-pay | Admitting: *Deleted

## 2019-06-19 NOTE — Patient Outreach (Signed)
Telephone outreach.  Pt feeling dizzy and nauseous, having ddeifficulty in moving thick secretions. C/o post nasal drip and some blood tinged nasal secretions.  Recommended increased fluids, humidifier. Epitaxis - pinch nose, chin to chest until bleeding stops. It it will not stop call provider to be instructed to come to office or go to ED. May also use Afrin but only for nose bleed.  Has not called for [facility] appt. Reminded of the list that was given and the recommendation to choose 3, call and see who can see him the earliest.  Provided Gwynneth Munson, SW, phone.  Encouraged him to call his provider or me if any questions.  [name], MSN, [facility] Gerontological Nurse Practitioner [facility] Care Management [phone]

## 2019-06-22 DIAGNOSIS — U071 COVID-19: Secondary | ICD-10-CM | POA: Diagnosis not present

## 2019-06-30 ENCOUNTER — Telehealth: Payer: Self-pay

## 2019-06-30 NOTE — Telephone Encounter (Signed)
Patient calls nurse line stating he would like a refill on Clobetasol Ointment. Patient was given this during December hospital stay. Patient states he uses ointment for psoriasis, he does one application 1x daiy to improve appearance so he can wear short sleeves. Patient is also requesting a refill on cough syrup. Patient scheduled for a FU 4/12 with PCP.

## 2019-07-02 ENCOUNTER — Other Ambulatory Visit: Payer: Self-pay | Admitting: Family Medicine

## 2019-07-02 DIAGNOSIS — R05 Cough: Secondary | ICD-10-CM

## 2019-07-02 DIAGNOSIS — R053 Chronic cough: Secondary | ICD-10-CM

## 2019-07-02 DIAGNOSIS — L409 Psoriasis, unspecified: Secondary | ICD-10-CM

## 2019-07-02 MED ORDER — BENZONATATE 100 MG PO CAPS: 200.0000 mg | ORAL_CAPSULE | Freq: Three times a day (TID) | ORAL | 1 refills | Status: DC | PRN

## 2019-07-02 MED ORDER — CLOBETASOL PROPIONATE 0.05 % EX OINT: 1.0000 "application " | TOPICAL_OINTMENT | Freq: Two times a day (BID) | CUTANEOUS | 1 refills | Status: DC

## 2019-07-02 NOTE — Telephone Encounter (Signed)
Sent in refills for patient (tessalon perles and clobetasol ointment).  Instructed on ointment instructions not to use over 2 weeks at a time, this is a high potency topical steroid that should not be used on the face or for prolonged time as it could lead to hypopigmentation and skin thinning.  Can discuss further psoriasis treatment if needed on follow-up.  [name], DO

## 2019-07-13 ENCOUNTER — Other Ambulatory Visit: Payer: Self-pay | Admitting: Family Medicine

## 2019-07-13 DIAGNOSIS — E785 Hyperlipidemia, unspecified: Secondary | ICD-10-CM

## 2019-07-14 ENCOUNTER — Ambulatory Visit: Payer: PPO | Attending: Internal Medicine

## 2019-07-14 DIAGNOSIS — Z23 Encounter for immunization: Secondary | ICD-10-CM

## 2019-07-14 NOTE — Progress Notes (Signed)
   Covid-19 Vaccination Clinic  Name:  Cameron Diaz    MRN: [id number] DOB: 1960/07/19  07/14/2019  Mr. Porrazzo was observed post Covid-19 immunization for 15 minutes without incident. He was provided with Vaccine Information Sheet and instruction to access the V-Safe system.   Mr. Marsella was instructed to call 911 with any severe reactions post vaccine: [name] Difficulty breathing  . Swelling of face and throat  . A fast heartbeat  . A bad rash all over body  . Dizziness and weakness   Immunizations Administered    Name Date Dose VIS Date Route   Pfizer COVID-19 Vaccine 07/14/2019 12:18 PM 0.3 mL 03/21/2019 Intramuscular   Manufacturer: [place]   Lot: [id number]   [place]: [id number]

## 2019-07-15 ENCOUNTER — Other Ambulatory Visit: Payer: Self-pay | Admitting: Family Medicine

## 2019-07-15 DIAGNOSIS — E785 Hyperlipidemia, unspecified: Secondary | ICD-10-CM

## 2019-07-15 MED ORDER — ROSUVASTATIN CALCIUM 20 MG PO TABS: 20.0000 mg | ORAL_TABLET | Freq: Every day | ORAL | 1 refills | Status: DC

## 2019-07-18 ENCOUNTER — Other Ambulatory Visit: Payer: Self-pay | Admitting: *Deleted

## 2019-07-18 ENCOUNTER — Other Ambulatory Visit: Payer: Self-pay | Admitting: Family Medicine

## 2019-07-18 MED ORDER — LISINOPRIL 20 MG PO TABS: 20.0000 mg | ORAL_TABLET | Freq: Every day | ORAL | 1 refills | Status: DC

## 2019-07-18 MED ORDER — METOPROLOL TARTRATE 25 MG PO TABS: 25.0000 mg | ORAL_TABLET | Freq: Two times a day (BID) | ORAL | 3 refills | Status: DC

## 2019-07-18 NOTE — Telephone Encounter (Signed)
Patients wife is calling and would like to have patients Lisinopril refilled.

## 2019-07-21 ENCOUNTER — Other Ambulatory Visit: Payer: Self-pay

## 2019-07-21 ENCOUNTER — Ambulatory Visit (INDEPENDENT_AMBULATORY_CARE_PROVIDER_SITE_OTHER): Payer: PPO | Admitting: Family Medicine

## 2019-07-21 VITALS — BP 136/80 | HR 100 | Wt 227.8 lb

## 2019-07-21 DIAGNOSIS — U071 COVID-19: Secondary | ICD-10-CM

## 2019-07-21 DIAGNOSIS — I10 Essential (primary) hypertension: Secondary | ICD-10-CM

## 2019-07-21 DIAGNOSIS — J1282 Pneumonia due to coronavirus disease 2019: Secondary | ICD-10-CM | POA: Diagnosis not present

## 2019-07-21 DIAGNOSIS — R0609 Other forms of dyspnea: Secondary | ICD-10-CM

## 2019-07-21 DIAGNOSIS — I824Z3 Acute embolism and thrombosis of unspecified deep veins of distal lower extremity, bilateral: Secondary | ICD-10-CM

## 2019-07-21 DIAGNOSIS — R0600 Dyspnea, unspecified: Secondary | ICD-10-CM | POA: Diagnosis not present

## 2019-07-21 DIAGNOSIS — R000 Tachycardia, unspecified: Secondary | ICD-10-CM | POA: Diagnosis not present

## 2019-07-21 DIAGNOSIS — E785 Hyperlipidemia, unspecified: Secondary | ICD-10-CM

## 2019-07-21 DIAGNOSIS — E119 Type 2 diabetes mellitus without complications: Secondary | ICD-10-CM

## 2019-07-21 NOTE — Progress Notes (Signed)
    SUBJECTIVE:   CHIEF COMPLAINT / HPI: Follow-up chronic conditions  [name] is a 59 year old gentleman presenting discussed the following:  Hypertension: Increase his Norvasc to 10 mg in addition to his lisinopril 20 mg and metoprolol last month.  He has his refills at the pharmacy for his metoprolol and Norvasc, however has been so busy and has not picked them up.  Has not had them in about 2 days.  Hyperlipidemia: Known elevated ASCVD 10-year risk, started Crestor in 06/2019.  States he has been tolerating this well without any myalgias.  Post-Covid: Still has intermittent dry cough.  While he feels he is slowly been improving since December, still is quite deconditioned.  Able to walk around a few blocks but then will feel winded.  He has been working closely with his pastor and church group to help him to cope through his prolonged recovery.  Keeps forgetting to get his CXR at [place].  PERTINENT  PMH / PSH: Hypertension, bilateral DVTs in 03/2019, COVID-19 pneumonia in 03/2019, remote history of drug/alcohol abuse, psoriasis, hyperlipidemia, adjustment disorder  OBJECTIVE:   BP 136/80   Pulse 100   Wt 227 lb 12.8 oz (103.3 kg)   SpO2 97%   BMI 31.77 kg/m    General: Alert, NAD HEENT: NCAT, MMM Cardiac: RRR no m/g/r Lungs: Clear bilaterally anterior and posterior w/o crackles or wheezing, no increased WOB, satting appropriately on room air Abdomen: soft, non-tender Msk: Moves all extremities spontaneously  Ext: Warm, dry, 2+ distal pulses, no edema   ASSESSMENT/PLAN:   Essential hypertension Reasonable management with lisinopril 20 mg, metoprolol 25mg  BID, and Norvasc 10 mg.  Unfortunately has not been able to pick up his medications, will do so this afternoon.  Sinus tachycardia HR 100-110 during today's visit.  Asymptomatic, has not taken his metoprolol in about 2 days, suspect reflex tachycardia secondary to this.  Hyperlipidemia Primary prevention with  concurrent diabetes.  Recently started on Crestor, tolerating well. Will continue Crestor 20 mg.   Dyspnea on exertion In the setting of prolonged recovery from moderate/severe Covid requiring approximately 3-week hospitalization in 03/2019, persistent DOE with dry cough however substantially improved from initial illness.  His symptomatology is certainly still within the expected timeframe of recovery especially given the severity of his illness, however will refer to [facility] post-Covid clinic for an additional evaluation.  Reassuringly has been off supplemental O2 >1 month and without abnormalities on pulmonary or cardiac exam, but may benefit from PFTs to assess for more permanent lung damage.  Awaiting repeat CXR, patient to go to [facility] imaging at convenience. Will also consider repeating echocardiogram (EF 65-70% in 03/2019), no additional sequela of CHF.  Encouraged staying active as possible.     Follow-up in 2 months for chronic conditions or sooner if needed.  [name], [place]

## 2019-07-21 NOTE — Patient Instructions (Signed)
It was wonderful to see you today as always, keep doing a wonderful job!   When you have a chance this week I would like you to go over to the [facility] imaging center to get that chest x-ray, and the results will come directly to me.  Please also make sure that you pick up your prescriptions.  I would look in to getting you over to a pulmonologist after your chest x-ray and depending on how you continue to do.  I encourage you to continue pushing yourself to stay active throughout the day.  I would like you to follow-up in about 2 months or sooner if needed.

## 2019-07-23 ENCOUNTER — Encounter: Payer: Self-pay | Admitting: Family Medicine

## 2019-07-23 DIAGNOSIS — U071 COVID-19: Secondary | ICD-10-CM | POA: Diagnosis not present

## 2019-07-23 DIAGNOSIS — R0609 Other forms of dyspnea: Secondary | ICD-10-CM | POA: Insufficient documentation

## 2019-07-23 DIAGNOSIS — R0602 Shortness of breath: Secondary | ICD-10-CM | POA: Insufficient documentation

## 2019-07-23 DIAGNOSIS — R0600 Dyspnea, unspecified: Secondary | ICD-10-CM | POA: Insufficient documentation

## 2019-07-23 NOTE — Assessment & Plan Note (Signed)
HR 100-110 during today's visit.  Asymptomatic, has not taken his metoprolol in about 2 days, suspect reflex tachycardia secondary to this.

## 2019-07-23 NOTE — Assessment & Plan Note (Deleted)
In 03/2019 with prolonged hospitalization, continue to improve clinically however quite slowly with persistent dyspnea on exertion and lingering dry cough.  Awaiting repeat CXR for resolution.  Will additionally refer to [facility] post Covid clinic for evaluation, may benefit from PFTs.

## 2019-07-23 NOTE — Assessment & Plan Note (Addendum)
In the setting of prolonged recovery from moderate/severe Covid requiring approximately 3-week hospitalization in 03/2019, persistent DOE with dry cough however substantially improved from initial illness.  His symptomatology is certainly still within the expected timeframe of recovery especially given the severity of his illness, however will refer to [facility] post-Covid clinic for an additional evaluation.  Reassuringly has been off supplemental O2 >1 month and without abnormalities on pulmonary or cardiac exam, but may benefit from PFTs to assess for more permanent lung damage.  Awaiting repeat CXR, patient to go to [facility] imaging at convenience. Will also consider repeating echocardiogram (EF 65-70% in 03/2019), no additional sequela of CHF.  Encouraged staying active as possible.

## 2019-07-23 NOTE — Assessment & Plan Note (Signed)
Reasonable management with lisinopril 20 mg, metoprolol 25mg  BID, and Norvasc 10 mg.  Unfortunately has not been able to pick up his medications, will do so this afternoon.

## 2019-07-23 NOTE — Assessment & Plan Note (Addendum)
Primary prevention with concurrent diabetes.  Recently started on Crestor, tolerating well. Will continue Crestor 20 mg.

## 2019-07-28 ENCOUNTER — Other Ambulatory Visit: Payer: PPO | Admitting: *Deleted

## 2019-07-28 ENCOUNTER — Other Ambulatory Visit: Payer: Self-pay

## 2019-07-28 ENCOUNTER — Ambulatory Visit
Admission: RE | Admit: 2019-07-28 | Discharge: 2019-07-28 | Disposition: A | Discharge status: Home / Self Care | Payer: PPO | Source: Ambulatory Visit | Attending: Family Medicine | Admitting: Family Medicine

## 2019-07-28 DIAGNOSIS — J9601 Acute respiratory failure with hypoxia: Secondary | ICD-10-CM

## 2019-07-28 DIAGNOSIS — J1282 Pneumonia due to coronavirus disease 2019: Secondary | ICD-10-CM | POA: Diagnosis not present

## 2019-07-28 DIAGNOSIS — U071 COVID-19: Secondary | ICD-10-CM | POA: Diagnosis not present

## 2019-07-28 NOTE — Patient Outreach (Signed)
[facility]) Care Management  07/28/2019  Cameron Diaz 1960/08/27 [id number]   Telephone outreach to follow up on pt's health status:  COVID  Out pt mental health:  [name], MSN, [facility] Gerontological Nurse Practitioner [facility] Care Management [phone]

## 2019-08-04 ENCOUNTER — Ambulatory Visit: Payer: PPO | Attending: Internal Medicine

## 2019-08-04 DIAGNOSIS — Z23 Encounter for immunization: Secondary | ICD-10-CM

## 2019-08-04 NOTE — Progress Notes (Signed)
   Covid-19 Vaccination Clinic  Name:  Cameron Diaz    MRN: [id number] DOB: 09/16/1960  08/04/2019  Mr. Holter was observed post Covid-19 immunization for 15 minutes without incident. He was provided with Vaccine Information Sheet and instruction to access the V-Safe system.   [name] was instructed to call 911 with any severe reactions post vaccine: [name] Difficulty breathing  . Swelling of face and throat  . A fast heartbeat  . A bad rash all over body  . Dizziness and weakness   Immunizations Administered    Name Date Dose VIS Date Route   Pfizer COVID-19 Vaccine 08/04/2019  2:56 PM 0.3 mL 06/04/2018 Intramuscular   Manufacturer: Between   Lot: [id number]   [place]: [id number]

## 2019-08-05 ENCOUNTER — Other Ambulatory Visit: Payer: Self-pay | Admitting: *Deleted

## 2019-08-05 ENCOUNTER — Encounter: Payer: Self-pay | Admitting: *Deleted

## 2019-08-05 ENCOUNTER — Other Ambulatory Visit: Payer: Self-pay | Admitting: Family Medicine

## 2019-08-05 DIAGNOSIS — F4321 Adjustment disorder with depressed mood: Secondary | ICD-10-CM

## 2019-08-05 MED ORDER — ESCITALOPRAM OXALATE 10 MG PO TABS: 10.0000 mg | ORAL_TABLET | Freq: Every day | ORAL | 3 refills | Status: DC

## 2019-08-05 NOTE — Patient Outreach (Addendum)
[facility]) Care Management  08/05/2019  EFOSA SIRCY 1960/10/11 [id number]   HTA NP Telephone outreach: No answer to pt cell, home or wife's phone. Sent email to pt to request his return call for status update.  [name], MSN, GNP-BC Gerontological Nurse Practitioner [facility] Care Management [phone]  [name] returned my call. He says he is doing "OK." He is still not back to baseline since his COVID infection. He is working on being more active.  He has lost the outpt mental health provider list I had previously sent him and did not reach out to any of these providers. He has also been off his Lexapro as he only had a 30 day supply, given to him while inpt and no refills.  Will send list of providers to him again. Encouraged him to outreach and at least get an appt made by the time I call him back in one month.  Wife to call Dr. Higinio Plan to ask for refill on the Lexapro. I will also send a message to her.  Encourage pt to be active, think positive thoughts and count his blessings.  I will call him again in one month.  [name], MSN, [facility] Gerontological Nurse Practitioner [place] Care Management [phone]

## 2019-08-20 ENCOUNTER — Other Ambulatory Visit: Payer: Self-pay | Admitting: Family Medicine

## 2019-08-20 ENCOUNTER — Encounter: Payer: Self-pay | Admitting: Family Medicine

## 2019-08-20 DIAGNOSIS — Z8616 Personal history of COVID-19: Secondary | ICD-10-CM

## 2019-08-20 DIAGNOSIS — R0609 Other forms of dyspnea: Secondary | ICD-10-CM

## 2019-08-20 DIAGNOSIS — R0600 Dyspnea, unspecified: Secondary | ICD-10-CM

## 2019-08-22 DIAGNOSIS — U071 COVID-19: Secondary | ICD-10-CM | POA: Diagnosis not present

## 2019-09-01 ENCOUNTER — Other Ambulatory Visit: Payer: Self-pay | Admitting: *Deleted

## 2019-09-01 DIAGNOSIS — E785 Hyperlipidemia, unspecified: Secondary | ICD-10-CM

## 2019-09-01 MED ORDER — ROSUVASTATIN CALCIUM 20 MG PO TABS: 20.0000 mg | ORAL_TABLET | Freq: Every day | ORAL | 1 refills | Status: DC

## 2019-09-09 ENCOUNTER — Other Ambulatory Visit: Payer: Self-pay | Admitting: *Deleted

## 2019-09-09 NOTE — Patient Outreach (Signed)
[facility]) Care Management  09/09/2019  [name] 01-Jul-1960 [id number]  HTA NP Referral, telephone outreach, no answer but left a message to return my call.  Update on counseling: Medications: Diabetes:  [name] C. Myrtie Neither MSN, GNP-BC Gerontological Nurse Practitioner [facility] Care Management [phone]  09/10/19 Pt returned my call and left a message that he is doing OK. Helping out at his church some. Stated he would call me again later this week.  Sent pt text to encourage his efforts and to give me any updates on the above topics.  Cameron Pont. Myrtie Neither, MSN, [facility] Gerontological Nurse Practitioner [facility] Care Management [phone]

## 2019-09-20 ENCOUNTER — Other Ambulatory Visit: Payer: Self-pay | Admitting: Family Medicine

## 2019-09-20 DIAGNOSIS — E785 Hyperlipidemia, unspecified: Secondary | ICD-10-CM

## 2019-09-22 DIAGNOSIS — U071 COVID-19: Secondary | ICD-10-CM | POA: Diagnosis not present

## 2019-09-24 ENCOUNTER — Other Ambulatory Visit: Payer: Self-pay

## 2019-09-24 DIAGNOSIS — E785 Hyperlipidemia, unspecified: Secondary | ICD-10-CM

## 2019-09-24 MED ORDER — ROSUVASTATIN CALCIUM 20 MG PO TABS: 20.0000 mg | ORAL_TABLET | Freq: Every day | ORAL | 1 refills | Status: DC

## 2019-09-26 ENCOUNTER — Other Ambulatory Visit: Payer: Self-pay | Admitting: Family Medicine

## 2019-09-26 DIAGNOSIS — E785 Hyperlipidemia, unspecified: Secondary | ICD-10-CM

## 2019-10-14 ENCOUNTER — Telehealth: Payer: Self-pay

## 2019-10-14 NOTE — Telephone Encounter (Signed)
Patient's wife calls nurse line regarding concerns with medication management. Patient reports that he has not been compliant with taking his Xarelto. Patient has a history of DVTs related to [place] and wife was unsure if patient was to continue on medication long term. Patient advised at [place] in April to follow up in June, however appointment was unable to be scheduled.   No available openings with PCP until the end of the month. Due to DVT history and delayed follow up, patient was scheduled with first available "Red Team" provider (Dr. Erin Hearing).   Forwarding to PCP and Dr. Sherlyn Lick, RN

## 2019-10-14 NOTE — Telephone Encounter (Signed)
Attempted to reach patient, no answer and left voicemail.  Would have expected him to complete his Xarelto by June 2021, as he started therapy on 03/23/2019.  Considered as a provoked DVT in the setting of Covid 19 illness and prolonged hospitalization, generously did a 52-month treatment rather than 3 months.  He does not need to continue this long-term.  Additionally, he is more than welcome to follow-up tomorrow if he would like to, however if he has no acute concerns it would be more than reasonable to have him reschedule with myself within the next month.  [name], DO

## 2019-10-15 ENCOUNTER — Ambulatory Visit (INDEPENDENT_AMBULATORY_CARE_PROVIDER_SITE_OTHER): Payer: PPO | Admitting: Family Medicine

## 2019-10-15 ENCOUNTER — Encounter: Payer: Self-pay | Admitting: Family Medicine

## 2019-10-15 ENCOUNTER — Other Ambulatory Visit: Payer: Self-pay

## 2019-10-15 VITALS — BP 166/104 | HR 83 | Ht 71.0 in | Wt 237.2 lb

## 2019-10-15 DIAGNOSIS — E119 Type 2 diabetes mellitus without complications: Secondary | ICD-10-CM | POA: Diagnosis not present

## 2019-10-15 DIAGNOSIS — I10 Essential (primary) hypertension: Secondary | ICD-10-CM | POA: Diagnosis not present

## 2019-10-15 LAB — POCT GLYCOSYLATED HEMOGLOBIN (HGB A1C): HbA1c, POC (controlled diabetic range): 6.5 % (ref 0.0–7.0)

## 2019-10-15 NOTE — Telephone Encounter (Signed)
Patient's voicemail full but does have an appointment today with Dr. Erin Hearing.  Cameron Diaz, [place]

## 2019-10-15 NOTE — Progress Notes (Addendum)
SUBJECTIVE:   CHIEF COMPLAINT / HPI:   Cameron Diaz is a 59 y.o. male with a history of DVT from COVID-19, HTN, HLD, T2DM, leg edema, and psoriasis who presents for a follow up.  DVT Prophylaxis Patient ran out of his rivaroxaban 2 weeks ago. States he had trouble obtaining it due to cost. Good adherence when we was taking it with dinner. Said he received Dr. Bolivar Haw voicemail telling him he could stop taking it, as his 6 month course ended in June 2021.  No calf swelling now   Hand Rash He reports having a 2.5 cm, circular eczematous rash on the lateral aspect of his left hand. He also has a less scaly 1 cm lesion on the left 4th digit. He states that these lesions have been present since he left the hospital for COVID-19 in January. He states they have gotten larger over time. He says that these lesions are not painful and do not itch or burn. When he picks at the larger lesion, it begins to bleed. He has tried the a cream he was  prescribed for his plaque psoriasis inconsistently, and there has been no improvement.  He is unsure of the name of the cream   Leg Cramps Patient states that he has been feeling like his legs are going to cramp upon dorsiflexion and plantarflexion for the last week. He is concerned this is due to being off of his xarelto. His legs are the only areas where he feels this. He has been exercising more lately. There is no edema or pain upon rest.  Bee Stings He was grilling out for the holiday weekend and states he was stung multiple times by yellow jackets on both arms, the right axilla, and the right side of his torso. He says some of them still sting. He has been putting alcohol on the stings with no improvement. He had no shortness of breath or lip swelling   PERTINENT  PMH / PSH:  DVT from COVID-19, HTN, HLD, T2DM, leg edema   He states he has been stressed out as a result of his diagnosis with COVID-19 and financial situation. He says his water heater has  broken down, and him and his wife have had to take cold showers. He has a Education officer, museum that he talks with during these times of feeling down.  OBJECTIVE:   BP (!) 166/104   Pulse 83   Ht 5\' 11"  (1.803 m)   Wt 237 lb 3.2 oz (107.6 kg)   SpO2 96%   BMI 33.08 kg/m    GEN: well-appearing, conversant, in NAD SKIN: 2.5 cm and 1 cm eczematous lesions on the left lateral palm and left 4th digit, respectively. Multiple urticaria on both arms, the right axilla, and the right side of his torso. EXT: moves extremities bilaterally, no edema.  No calf tenderness or swelling  Psych:  Cognition and judgment appear intact. Alert, communicative  and cooperative with normal attention span and concentration. No apparent delusions, illusions, hallucinations   ASSESSMENT/PLAN:   Essential hypertension BP is high today at 166/104. Patient stated he took medication right before coming into office. Counseled to take medication in the morning upon waking and follow up with Dr. Higinio Plan.  Also to bring in all his medication since he does not know what he is taking.    DVT Prophylaxis Patient has completed 6 month course of xarelto as of June 2021. Discontinue this medication. Follow up if any abnormal calf  swelling.  Hand Rash Rash could be consistent with dyshidrotic eczema or another flare of psoriasis like on the right arm. Use clobetasol that was prescribed for the plaque psoriasis twice a day for 2 weeks and assess improvement.  Leg Cramping Muscle cramping localized to both legs. There is no worrisome edema or redness. Patient began to exercise a lot recently. Try leg stretching exercises in the morning and at night to relieve the pain.  Bee Stings Stings occurred over the holiday weekend and still sting sometimes. Alcohol has not helped. Advised that the stings will go away in about a week. Use a benadryl cream as needed for itching.  T2DM A1c is 6.5%. Diabetes is controlled. Continue on current  regimen and bring all medications to next visit.  HYPERTENSION BP is high today at 166/104. Patient stated he took medication right before coming into office. Counseled to take medication in the morning upon waking and follow up with Dr. Higinio Plan.  Also to bring in all his medication since he does not know what he is taking.   Follow Up Return in 2-4 weeks to see Dr. Higinio Plan and assess above recommendations.  Lind Covert, MD [place]

## 2019-10-15 NOTE — Patient Instructions (Signed)
Good to see you today!  Thanks for coming in.  For the eliquis - you can stop this medication.  If you have any calf large amount of swelling then let us know  For the cramps - stretching exercise twice a day and before exercise  For the hand rash - use the cream twice a day every day for about 2 weeks.  It should get a lot better   The stings should go away in the next week  Your diabetes is doing well  Your blood pressure is high today.  It may be because of not taking your medications until your visit  Measure your blood pressure at home and bring in the machine and your readings  Bring all your medication bottles next visit to see Dr Higinio Plan  See her in 2-4 weeks

## 2019-10-15 NOTE — Assessment & Plan Note (Signed)
BP is high today at 166/104. Patient stated he took medication right before coming into office. Counseled to take medication in the morning upon waking and follow up with Dr. Higinio Plan.  Also to bring in all his medication since he does not know what he is taking.

## 2019-10-22 DIAGNOSIS — U071 COVID-19: Secondary | ICD-10-CM | POA: Diagnosis not present

## 2019-11-03 ENCOUNTER — Encounter: Payer: Self-pay | Admitting: Family Medicine

## 2019-11-03 ENCOUNTER — Ambulatory Visit (INDEPENDENT_AMBULATORY_CARE_PROVIDER_SITE_OTHER): Payer: PPO | Admitting: Family Medicine

## 2019-11-03 ENCOUNTER — Other Ambulatory Visit: Payer: Self-pay

## 2019-11-03 VITALS — BP 145/82 | HR 100 | Ht 71.0 in | Wt 238.2 lb

## 2019-11-03 DIAGNOSIS — R0600 Dyspnea, unspecified: Secondary | ICD-10-CM

## 2019-11-03 DIAGNOSIS — I10 Essential (primary) hypertension: Secondary | ICD-10-CM | POA: Diagnosis not present

## 2019-11-03 DIAGNOSIS — F32A Depression, unspecified: Secondary | ICD-10-CM

## 2019-11-03 DIAGNOSIS — I824Z3 Acute embolism and thrombosis of unspecified deep veins of distal lower extremity, bilateral: Secondary | ICD-10-CM

## 2019-11-03 DIAGNOSIS — R0609 Other forms of dyspnea: Secondary | ICD-10-CM

## 2019-11-03 DIAGNOSIS — F329 Major depressive disorder, single episode, unspecified: Secondary | ICD-10-CM

## 2019-11-03 MED ORDER — AMLODIPINE BESYLATE 10 MG PO TABS: 10.0000 mg | ORAL_TABLET | Freq: Every day | ORAL | 3 refills | Status: DC

## 2019-11-03 NOTE — Progress Notes (Signed)
    SUBJECTIVE:   CHIEF COMPLAINT / HPI: Follow-up HTN  Cameron Diaz is a 59 year old gentleman presenting for follow-up.  Last seen by Dr. Erin Hearing on 7/7.  Hypertension: Elevated at 166/104 during last office visit.  Generally ranges around 120-140s in previous office visits.  He was not able to recollect medications he takes last visit, recommended to bring in today.  He is prescribed lisinopril 20 mg, Lopressor 25 mg BID, Norvasc 10 mg (however do see that Norvasc is no longer on his medication list).  He forgot to bring in his medications, can recall taking lisinopril and Lopressor at home but not sure about amlodipine.  Does not check BP at home.  Respiratory/history of COVID-19: Experiencing long symptoms, still frequently feels winded with activity.  Able to do all ADLs.  Referred to pulmonology in May, however referral appears closed.  He did not hear from the pulmonary office.  No chest pain or palpitations.  Depression/anxiety: He still struggles with dealing with his physical capabilities at this point.  He has a good family/friends and church support.  He has not touched base with therapy, on Lexapro 10 mg.  Reports "I feel like things will get better if I give it time."    Office Visit from 11/03/2019 in [place] Office Visit from 06/13/2019 in [place] Office Visit from 05/30/2019 in [place]  Thoughts that you would be better off dead, or of hurting yourself in some way -- Not at all Not at all  PHQ-9 Total Score 4 7 8       PERTINENT  PMH / PSH: Hypertension, bilateral DVTs in 03/2019 during [place] hospitalization, COVID-19 pneumonia in 03/2019, remote history of drug/alcohol abuse, psoriasis, hyperlipidemia, adjustment disorder  OBJECTIVE:   BP (!) 145/82   Pulse 100   Ht 5\' 11"  (1.803 m)   Wt (!) 238 lb 3.2 oz (108 kg)   SpO2 96%   BMI 33.22 kg/m   General: Alert, NAD HEENT: NCAT, MMM, raspy  voice Cardiac: RRR no m/g/r Lungs: Clear bilaterally, no increased WOB, no crackles or wheezing appreciated Msk: Moves all extremities spontaneously  Ext: Warm, dry, 2+ distal pulses, no edema b/l   ASSESSMENT/PLAN:   Essential hypertension Uncontrolled, BP 145/82.  Unclear what medications he is taking at home especially his Norvasc is no longer on his prescription medications, Rx'd Norvasc 10 mg today.  Instructed to bring in all of his medications next time and review AVS list when he gets home to compare what medicines he has, call me if different.   DVT, bilateral lower limbs ([place]) Completed 6 months of Xarelto.  No residual LE concerns.   Depression Improved but persistent, in the setting of residual Covid.  Will increase Lexapro to 20 mg daily and encouraged to establish with therapy, patient has resources printed at home.   Dyspnea on exertion Improving but persistent, no hypoxia.  In setting of prolonged recovery from moderate/severe Covid requiring 3-week hospitalization in 03/2019.  Abnormal echo during hospitalization, will discuss with patient to get repeat.  Additionally believe he needs full PFTs w/ [facility], will investigate close pulmonology referral and replace if needed.    Follow-up in 1 month or sooner if needed.  [name], [place]

## 2019-11-03 NOTE — Patient Instructions (Addendum)
It was wonderful to see you today.  You can go ahead increase your Lexapro to 2 tablets daily.  When you run out I will increase the dose.  I am also can send in a new prescription of Norvasc, this is for your blood pressure.  Please compare the list of medications on your after visit summary today and call me if there are any abnormalities compared to the list.   I encourage you to reach out to therapist around the area to discuss your concerns and vent.  I am also going to replace the pulmonology referral, if you do not hear from them in the next 2 weeks please give me a call.

## 2019-11-06 ENCOUNTER — Encounter: Payer: Self-pay | Admitting: Family Medicine

## 2019-11-06 NOTE — Assessment & Plan Note (Addendum)
Completed 6 months of Xarelto.  No residual LE concerns.

## 2019-11-06 NOTE — Assessment & Plan Note (Signed)
Improved but persistent, in the setting of residual Covid.  Will increase Lexapro to 20 mg daily and encouraged to establish with therapy, patient has resources printed at home.

## 2019-11-06 NOTE — Assessment & Plan Note (Addendum)
Improving but persistent, no hypoxia.  In setting of prolonged recovery from moderate/severe Covid requiring 3-week hospitalization in 03/2019.  Abnormal echo during hospitalization, will discuss with patient to get repeat.  Additionally believe he needs full PFTs w/ [facility], will investigate close pulmonology referral and replace if needed.

## 2019-11-06 NOTE — Assessment & Plan Note (Signed)
Uncontrolled, BP 145/82.  Unclear what medications he is taking at home especially his Norvasc is no longer on his prescription medications, Rx'd Norvasc 10 mg today.  Instructed to bring in all of his medications next time and review AVS list when he gets home to compare what medicines he has, call me if different.

## 2019-11-22 DIAGNOSIS — U071 COVID-19: Secondary | ICD-10-CM | POA: Diagnosis not present

## 2019-12-05 ENCOUNTER — Ambulatory Visit: Payer: PPO | Admitting: Family Medicine

## 2019-12-06 ENCOUNTER — Other Ambulatory Visit: Payer: Self-pay | Admitting: Family Medicine

## 2019-12-06 DIAGNOSIS — F4321 Adjustment disorder with depressed mood: Secondary | ICD-10-CM

## 2019-12-16 DIAGNOSIS — E669 Obesity, unspecified: Secondary | ICD-10-CM | POA: Diagnosis not present

## 2019-12-16 DIAGNOSIS — E119 Type 2 diabetes mellitus without complications: Secondary | ICD-10-CM | POA: Diagnosis not present

## 2019-12-16 DIAGNOSIS — M25512 Pain in left shoulder: Secondary | ICD-10-CM | POA: Diagnosis not present

## 2019-12-17 ENCOUNTER — Ambulatory Visit: Payer: PPO | Admitting: Family Medicine

## 2019-12-17 ENCOUNTER — Other Ambulatory Visit: Payer: Self-pay | Admitting: Family Medicine

## 2019-12-17 NOTE — Progress Notes (Deleted)
    SUBJECTIVE:   CHIEF COMPLAINT / HPI:   ***  PERTINENT  PMH / PSH: ***  OBJECTIVE:   There were no vitals taken for this visit.  ***  ASSESSMENT/PLAN:   No problem-specific Assessment & Plan notes found for this encounter.     [name], [place]

## 2019-12-23 DIAGNOSIS — U071 COVID-19: Secondary | ICD-10-CM | POA: Diagnosis not present

## 2019-12-30 DIAGNOSIS — H0102B Squamous blepharitis left eye, upper and lower eyelids: Secondary | ICD-10-CM | POA: Diagnosis not present

## 2019-12-30 DIAGNOSIS — H0102A Squamous blepharitis right eye, upper and lower eyelids: Secondary | ICD-10-CM | POA: Diagnosis not present

## 2019-12-30 DIAGNOSIS — H16423 Pannus (corneal), bilateral: Secondary | ICD-10-CM | POA: Diagnosis not present

## 2019-12-30 DIAGNOSIS — H18463 Peripheral corneal degeneration, bilateral: Secondary | ICD-10-CM | POA: Diagnosis not present

## 2019-12-31 ENCOUNTER — Other Ambulatory Visit: Payer: Self-pay | Admitting: *Deleted

## 2019-12-31 DIAGNOSIS — M25512 Pain in left shoulder: Secondary | ICD-10-CM | POA: Diagnosis not present

## 2019-12-31 DIAGNOSIS — Z79899 Other long term (current) drug therapy: Secondary | ICD-10-CM | POA: Diagnosis not present

## 2019-12-31 NOTE — Patient Outreach (Signed)
[facility]) Care Management  12/31/2019  Cameron Diaz Sep 06, 1960 [id number]  Telephone outreach. Left a message and requested a return call. Attempted to leave message on wife's call and was unable to leave message on her line.  [name], MSN, [facility] Gerontological Nurse Practitioner [facility] Care Management [phone]  01/01/20  [name] returned my call today. He sounds like a totally different person today, he is upbeat, cheerful, talkative, laughing. He denies having gone to counseling. He says the difference is he is much better and his COVID sxs are nearly gone. He attributes being reaccquinted with a friend that was also hospitalized at the same time as himself and talking with him, sharing similar experiences has been mentally and emotionally beneficial to both of them.  He still reports SOB on exertion and is still using his oxygen when he is walking around. He is still using the incentive spirometer but not able to reach the goal set. He will be scheduled to see a pulmonologist for further evaluation.  Agreed we would talk again in 3 months and encouraged him to reach out if he has questions or problems to discuss. Shared that NP is very happy to hear him sound so lively and feeling better.  [name], MSN, [facility] Gerontological Nurse Practitioner [facility] Care Management [phone]

## 2020-01-05 DIAGNOSIS — H0102A Squamous blepharitis right eye, upper and lower eyelids: Secondary | ICD-10-CM | POA: Diagnosis not present

## 2020-01-05 DIAGNOSIS — H16423 Pannus (corneal), bilateral: Secondary | ICD-10-CM | POA: Diagnosis not present

## 2020-01-05 DIAGNOSIS — H18463 Peripheral corneal degeneration, bilateral: Secondary | ICD-10-CM | POA: Diagnosis not present

## 2020-01-05 DIAGNOSIS — H0102B Squamous blepharitis left eye, upper and lower eyelids: Secondary | ICD-10-CM | POA: Diagnosis not present

## 2020-01-08 DIAGNOSIS — H0102B Squamous blepharitis left eye, upper and lower eyelids: Secondary | ICD-10-CM | POA: Diagnosis not present

## 2020-01-08 DIAGNOSIS — H0102A Squamous blepharitis right eye, upper and lower eyelids: Secondary | ICD-10-CM | POA: Diagnosis not present

## 2020-01-08 DIAGNOSIS — H16003 Unspecified corneal ulcer, bilateral: Secondary | ICD-10-CM | POA: Diagnosis not present

## 2020-01-08 DIAGNOSIS — E119 Type 2 diabetes mellitus without complications: Secondary | ICD-10-CM | POA: Diagnosis not present

## 2020-01-08 DIAGNOSIS — H16423 Pannus (corneal), bilateral: Secondary | ICD-10-CM | POA: Diagnosis not present

## 2020-01-22 DIAGNOSIS — U071 COVID-19: Secondary | ICD-10-CM | POA: Diagnosis not present

## 2020-01-31 ENCOUNTER — Other Ambulatory Visit: Payer: Self-pay | Admitting: Family Medicine

## 2020-01-31 DIAGNOSIS — F4321 Adjustment disorder with depressed mood: Secondary | ICD-10-CM

## 2020-02-22 DIAGNOSIS — U071 COVID-19: Secondary | ICD-10-CM | POA: Diagnosis not present

## 2020-02-24 ENCOUNTER — Ambulatory Visit: Payer: PPO

## 2020-03-07 ENCOUNTER — Other Ambulatory Visit: Payer: Self-pay | Admitting: Family Medicine

## 2020-03-23 DIAGNOSIS — U071 COVID-19: Secondary | ICD-10-CM | POA: Diagnosis not present

## 2020-03-25 ENCOUNTER — Other Ambulatory Visit: Payer: Self-pay | Admitting: *Deleted

## 2020-03-25 NOTE — Patient Outreach (Signed)
[facility]) Care Management  03/25/2020  RAHSAAN WEAKLAND [birth year] [id number]  HTA NP final outreach:  Unsuccessful, left message and requested a return call.  Mr. Heldt returned my call. He reports he is doing very well now. His mental and emotional health is good at this time He denies depression and continues to take his antidepressant.[name] His wife is very supportive. He never did connect with a mental health provider.  His diabetes is well controlled. Last HgbA1C was 6.2. He only checks his glucose once every week or two. He says his MD didn't really emphasize that.  We talked about trying to adhere to his diabetic diet over the holidays. Advised eat a little of everything to satisfy yourself, limit dressing, potatoes, mac n cheese and sweets.  He has had an eye exam this year.  Lipid panel was not at goal. Triglycerides were 263, LDL was 110, Total cholesterol was 199. He is on rosuvastin.  He is able to take care of his feel and denies any wounds.  He had his COVID vaccines, not a booster, to date.  Advised that I would not be following him after today, but if he needs a care manager in the future he can call his HTA Concierge or me and either of [place] can get him connected. I do not feel he needs services at this time.  [name], MSN, [facility] Gerontological Nurse Practitioner [facility] Care Management [phone]

## 2020-03-27 ENCOUNTER — Other Ambulatory Visit: Payer: Self-pay | Admitting: Family Medicine

## 2020-03-27 DIAGNOSIS — F4321 Adjustment disorder with depressed mood: Secondary | ICD-10-CM

## 2020-03-30 ENCOUNTER — Other Ambulatory Visit: Payer: PPO | Admitting: *Deleted

## 2020-04-01 ENCOUNTER — Other Ambulatory Visit: Payer: Self-pay | Admitting: Family Medicine

## 2020-04-01 DIAGNOSIS — E785 Hyperlipidemia, unspecified: Secondary | ICD-10-CM

## 2020-04-10 DIAGNOSIS — U071 COVID-19: Secondary | ICD-10-CM | POA: Diagnosis not present

## 2020-04-23 DIAGNOSIS — U071 COVID-19: Secondary | ICD-10-CM | POA: Diagnosis not present

## 2020-04-30 ENCOUNTER — Telehealth: Payer: Self-pay | Admitting: Family Medicine

## 2020-04-30 NOTE — Telephone Encounter (Signed)
Received a letter requesting records for continued oxygen supplies. He has not been seen since 10/2019 (no longer using oxygen at that time), with several no show/canceled appointments. He should be seen for his pulmonary status and HTN (never answered for his pulmonary referral).   Can we please get him scheduled for a follow up visit?   Thank you [name], DO

## 2020-04-30 NOTE — Telephone Encounter (Signed)
Called patient and scheduled appointment.

## 2020-05-06 ENCOUNTER — Encounter: Payer: Self-pay | Admitting: Family Medicine

## 2020-05-06 ENCOUNTER — Other Ambulatory Visit: Payer: Self-pay

## 2020-05-06 ENCOUNTER — Ambulatory Visit (INDEPENDENT_AMBULATORY_CARE_PROVIDER_SITE_OTHER): Payer: PPO | Admitting: Family Medicine

## 2020-05-06 VITALS — BP 119/70 | HR 68 | Ht 71.0 in | Wt 255.2 lb

## 2020-05-06 DIAGNOSIS — U099 Post covid-19 condition, unspecified: Secondary | ICD-10-CM | POA: Diagnosis not present

## 2020-05-06 DIAGNOSIS — R0600 Dyspnea, unspecified: Secondary | ICD-10-CM

## 2020-05-06 DIAGNOSIS — R21 Rash and other nonspecific skin eruption: Secondary | ICD-10-CM

## 2020-05-06 DIAGNOSIS — E119 Type 2 diabetes mellitus without complications: Secondary | ICD-10-CM

## 2020-05-06 DIAGNOSIS — B948 Sequelae of other specified infectious and parasitic diseases: Secondary | ICD-10-CM | POA: Diagnosis not present

## 2020-05-06 DIAGNOSIS — R5382 Chronic fatigue, unspecified: Secondary | ICD-10-CM

## 2020-05-06 DIAGNOSIS — I10 Essential (primary) hypertension: Secondary | ICD-10-CM | POA: Diagnosis not present

## 2020-05-06 DIAGNOSIS — R5383 Other fatigue: Secondary | ICD-10-CM | POA: Diagnosis not present

## 2020-05-06 DIAGNOSIS — G9332 Myalgic encephalomyelitis/chronic fatigue syndrome: Secondary | ICD-10-CM | POA: Insufficient documentation

## 2020-05-06 DIAGNOSIS — E785 Hyperlipidemia, unspecified: Secondary | ICD-10-CM | POA: Diagnosis not present

## 2020-05-06 DIAGNOSIS — R0609 Other forms of dyspnea: Secondary | ICD-10-CM

## 2020-05-06 HISTORY — DX: Post covid-19 condition, unspecified: U09.9

## 2020-05-06 HISTORY — DX: Myalgic encephalomyelitis/chronic fatigue syndrome: G93.32

## 2020-05-06 LAB — POCT GLYCOSYLATED HEMOGLOBIN (HGB A1C): Hemoglobin A1C: 7.8 % — AB (ref 4.0–5.6)

## 2020-05-06 MED ORDER — METFORMIN HCL ER 500 MG PO TB24: 1000.0000 mg | ORAL_TABLET | Freq: Every day | ORAL | 1 refills | Status: DC

## 2020-05-06 NOTE — Progress Notes (Signed)
SUBJECTIVE:   CHIEF COMPLAINT / HPI: Follow up   Mr. Prosser is a 60 year old gentleman presenting for follow-up and would like to discuss multiple concerns.  He has not been seen since 10/2019.  Fatigue/SOB: Present since COVID-19 illness requiring weeks of hospitalization in 03/2019.  Breathing has significantly improved since last visit in summer 2021. Doesn't have trouble with this much anymore, occasionally will have some dyspnea on exertion. Reports it as "feeling tired but not winded" after activity such as vacuuming and moving furniture. No DOE with walking around house or neighborhood. Constantly still feels fatigued, unchanged from previous. He also reports memory fog, difficultly with word recall.  Denies any significant chest pain, shortness of breath at rest, leg swelling, orthopnea.  He never was evaluated by pulmonology, states he was not able to call back to our office to get scheduled and referral was closed.  Uses oxygen when he feels restless and tired, doesn't check his oxygen level (can't find his pulse ox). He has used his oxygen about twice in the past 3 months.   Diabetes: Takes Metformin XR 500mg , however whenever his prescription so has been using his wife's.  States Home health RN checked his A1c and said it "very high" but he cannot remember what the number was. Did not instruct to follow up.  Does not check his CBGs.  Hypertension: Reports he has been doing a good job at taking his lisinopril, Norvasc, and Lopressor on a daily basis.   He would like to review his medications and make sure he has the correct ones.  Additionally would like to discuss:  -Chronic hand rash -Voice hoarse for several months -Chronic Epistaxis   PERTINENT  PMH / PSH: Hypertension, bilateral DVTs in 03/2019 during [place] hospitalization, COVID-19 pneumonia in 03/2019, remote history of drug/alcohol abuse, psoriasis, hyperlipidemia, adjustment disorder  OBJECTIVE:   BP 119/70   Pulse 68    Ht 5\' 11"  (1.803 m)   Wt 255 lb 3.2 oz (115.8 kg)   BMI 35.59 kg/m   General: Alert, NAD HEENT: NCAT, MMM, posterior oropharynx clear without exudate Cardiac: RRR no m/g/r appreciated  Lungs: Clear bilaterally, no increased WOB, on room air Msk: Moves all extremities spontaneously  Ext: Warm, dry, 2+ distal pulses, no significant LE edema bilaterally Derm: Dry rough patchs present on palmar aspect of the left hand  Pulse ox 99% at rest Pulse ox maintained around 95-99% while walking at least 100 feet in the office.  No increased work of breathing during this time, did not need to stop for rest.  ASSESSMENT/PLAN:   COVID-19 long hauler manifesting chronic fatigue Present and unchanged since severe infection in 03/2019.  Given concurrent dyspnea on exertion, which may also be secondary to previous COVID-19 infection, and previous abnormality seen on aortic valve will repeat echocardiogram.  No current physical evidence of CHF.  Additionally, will obtain updated labs including TSH, CBC, BMP, and updated A1c to ensure no contributing abnormality.  Will discuss obtaining PFTs during next visit as poorly controlled remaining pulmonary disease may be also adding.  DM (diabetes mellitus), type 2 (HCC) A1c elevated to 7.8, usually around 6-6.5.  Currently taking Metformin XR 500 mg, will increase to twice daily for now with ultimate goal to 2000 mg daily.  Less likely to be contributing to his fatigue as above.  Essential hypertension Well-controlled today.  Continue Norvasc, metoprolol, and lisinopril as is.  Instructed to bring in medications.  Dyspnea on exertion Further work-up for  concurrent fatigue as above.  He was never able to establish care with pulmonology, may not need at this point.  No longer qualifies or needs oxygen at home, will send discontinuation letter to his home health.  Rash Briefly discussed, will talk further on follow-up visit if able.  Chronic on the palmar surface  of his hands.  Does have appearance like dyshidrotic eczema, however likely psoriasis given his known history of this.  May use a high dose steroid cream that he has at home twice daily for the next week in addition to daily emollient.  Hyperlipidemia Check lipid panel.  On statin.    Follow-up in 1 week to discuss additional concerns.  Recommended regular intervals to follow-up to maintain care.  [name], [place]

## 2020-05-06 NOTE — Patient Instructions (Signed)
It was great to see you today.  It is very important that you follow-up routinely so that we can fully address all of your concerns and that you do not continue to suffer at home without follow-up.  We will check some labs today.  I will give you a call about these results.  It is likely that we will increase your Metformin after your A1c.  We are also getting a repeat of your echocardiogram, this is to look at how your heart pumps.  Please follow-up in the next 1-2 weeks to follow-up the above and discuss your additional concerns.

## 2020-05-07 ENCOUNTER — Telehealth: Payer: Self-pay

## 2020-05-07 ENCOUNTER — Encounter: Payer: Self-pay | Admitting: Family Medicine

## 2020-05-07 DIAGNOSIS — R21 Rash and other nonspecific skin eruption: Secondary | ICD-10-CM | POA: Insufficient documentation

## 2020-05-07 HISTORY — DX: Rash and other nonspecific skin eruption: R21

## 2020-05-07 LAB — CBC WITH DIFFERENTIAL/PLATELET
Basophils Absolute: 0.1 10*3/uL (ref 0.0–0.2)
Basos: 2 %
EOS (ABSOLUTE): 0.2 10*3/uL (ref 0.0–0.4)
Eos: 3 %
Hematocrit: 44.6 % (ref 37.5–51.0)
Hemoglobin: 15.9 g/dL (ref 13.0–17.7)
Immature Grans (Abs): 0 10*3/uL (ref 0.0–0.1)
Immature Granulocytes: 0 %
Lymphocytes Absolute: 2.6 10*3/uL (ref 0.7–3.1)
Lymphs: 42 %
MCH: 30.3 pg (ref 26.6–33.0)
MCHC: 35.7 g/dL (ref 31.5–35.7)
MCV: 85 fL (ref 79–97)
Monocytes Absolute: 0.6 10*3/uL (ref 0.1–0.9)
Monocytes: 9 %
Neutrophils Absolute: 2.7 10*3/uL (ref 1.4–7.0)
Neutrophils: 44 %
Platelets: 198 10*3/uL (ref 150–450)
RBC: 5.24 x10E6/uL (ref 4.14–5.80)
RDW: 12.8 % (ref 11.6–15.4)
WBC: 6.1 10*3/uL (ref 3.4–10.8)

## 2020-05-07 LAB — BASIC METABOLIC PANEL
BUN/Creatinine Ratio: 13 (ref 9–20)
BUN: 11 mg/dL (ref 6–24)
CO2: 22 mmol/L (ref 20–29)
Calcium: 9.5 mg/dL (ref 8.7–10.2)
Chloride: 101 mmol/L (ref 96–106)
Creatinine, Ser: 0.88 mg/dL (ref 0.76–1.27)
GFR calc Af Amer: 109 mL/min/{1.73_m2} (ref >59)
GFR calc non Af Amer: 94 mL/min/{1.73_m2} (ref >59)
Glucose: 123 mg/dL — ABNORMAL HIGH (ref 65–99)
Potassium: 4.5 mmol/L (ref 3.5–5.2)
Sodium: 139 mmol/L (ref 134–144)

## 2020-05-07 LAB — LIPID PANEL
Chol/HDL Ratio: 2.2 ratio (ref 0.0–5.0)
Cholesterol, Total: 138 mg/dL (ref 100–199)
HDL: 62 mg/dL (ref >39)
LDL Chol Calc (NIH): 58 mg/dL (ref 0–99)
Triglycerides: 101 mg/dL (ref 0–149)
VLDL Cholesterol Cal: 18 mg/dL (ref 5–40)

## 2020-05-07 LAB — TSH: TSH: 0.95 u[IU]/mL (ref 0.450–4.500)

## 2020-05-07 MED ORDER — LISINOPRIL 20 MG PO TABS: 20.0000 mg | ORAL_TABLET | Freq: Every day | ORAL | 1 refills | Status: DC

## 2020-05-07 MED ORDER — METOPROLOL TARTRATE 25 MG PO TABS: 25.0000 mg | ORAL_TABLET | Freq: Two times a day (BID) | ORAL | 3 refills | Status: DC

## 2020-05-07 MED ORDER — ROSUVASTATIN CALCIUM 20 MG PO TABS: 20.0000 mg | ORAL_TABLET | Freq: Every day | ORAL | 2 refills | Status: DC

## 2020-05-07 NOTE — Assessment & Plan Note (Signed)
Further work-up for concurrent fatigue as above.  He was never able to establish care with pulmonology, may not need at this point.  No longer qualifies or needs oxygen at home, will send discontinuation letter to his home health.

## 2020-05-07 NOTE — Assessment & Plan Note (Signed)
A1c elevated to 7.8, usually around 6-6.5.  Currently taking Metformin XR 500 mg, will increase to twice daily for now with ultimate goal to 2000 mg daily.  Less likely to be contributing to his fatigue as above.

## 2020-05-07 NOTE — Telephone Encounter (Signed)
Called and informed patient of echo appointment.  05/14/2020 [facility] [id number] with show time of 1500  Patient states he is very forgetful and wants a call back to remind him.  I will leave a message on his voicemail.  [name], [place]

## 2020-05-07 NOTE — Assessment & Plan Note (Addendum)
Present and unchanged since severe infection in 03/2019.  Given concurrent dyspnea on exertion, which may also be secondary to previous COVID-19 infection, and previous abnormality seen on aortic valve will repeat echocardiogram.  No current physical evidence of CHF.  Additionally, will obtain updated labs including TSH, CBC, BMP, and updated A1c to ensure no contributing abnormality.  Will discuss obtaining PFTs during next visit as poorly controlled remaining pulmonary disease may be also adding.

## 2020-05-07 NOTE — Telephone Encounter (Signed)
Called and LVM for patient informing him of Echocardiogram on:  05/14/2020 [facility] [id number] with arrival at 1500  .[name], CMA

## 2020-05-07 NOTE — Assessment & Plan Note (Signed)
Check lipid panel.  On statin 

## 2020-05-07 NOTE — Assessment & Plan Note (Signed)
Well-controlled today.  Continue Norvasc, metoprolol, and lisinopril as is.  Instructed to bring in medications.

## 2020-05-07 NOTE — Assessment & Plan Note (Signed)
Briefly discussed, will talk further on follow-up visit if able.  Chronic on the palmar surface of his hands.  Does have appearance like dyshidrotic eczema, however likely psoriasis given his known history of this.  May use a high dose steroid cream that he has at home twice daily for the next week in addition to daily emollient.

## 2020-05-14 ENCOUNTER — Other Ambulatory Visit: Payer: Self-pay

## 2020-05-14 ENCOUNTER — Ambulatory Visit (HOSPITAL_COMMUNITY)
Admission: RE | Admit: 2020-05-14 | Discharge: 2020-05-14 | Disposition: A | Discharge status: Home / Self Care | Payer: PPO | Source: Ambulatory Visit | Attending: Family Medicine | Admitting: Family Medicine

## 2020-05-14 DIAGNOSIS — R5382 Chronic fatigue, unspecified: Secondary | ICD-10-CM | POA: Diagnosis not present

## 2020-05-14 DIAGNOSIS — I351 Nonrheumatic aortic (valve) insufficiency: Secondary | ICD-10-CM | POA: Insufficient documentation

## 2020-05-14 DIAGNOSIS — I10 Essential (primary) hypertension: Secondary | ICD-10-CM | POA: Insufficient documentation

## 2020-05-14 DIAGNOSIS — U099 Post covid-19 condition, unspecified: Secondary | ICD-10-CM | POA: Insufficient documentation

## 2020-05-14 DIAGNOSIS — E119 Type 2 diabetes mellitus without complications: Secondary | ICD-10-CM | POA: Insufficient documentation

## 2020-05-14 DIAGNOSIS — R569 Unspecified convulsions: Secondary | ICD-10-CM | POA: Diagnosis not present

## 2020-05-14 DIAGNOSIS — R0600 Dyspnea, unspecified: Secondary | ICD-10-CM | POA: Diagnosis not present

## 2020-05-14 NOTE — Progress Notes (Signed)
  Echocardiogram 2D Echocardiogram has been performed.  Cameron Diaz 05/14/2020, 4:36 PM

## 2020-05-17 ENCOUNTER — Ambulatory Visit (INDEPENDENT_AMBULATORY_CARE_PROVIDER_SITE_OTHER): Payer: PPO | Admitting: Family Medicine

## 2020-05-17 ENCOUNTER — Encounter: Payer: Self-pay | Admitting: Family Medicine

## 2020-05-17 ENCOUNTER — Other Ambulatory Visit: Payer: Self-pay

## 2020-05-17 VITALS — BP 128/82 | HR 71 | Ht 72.0 in | Wt 254.0 lb

## 2020-05-17 DIAGNOSIS — R490 Dysphonia: Secondary | ICD-10-CM

## 2020-05-17 DIAGNOSIS — R0600 Dyspnea, unspecified: Secondary | ICD-10-CM

## 2020-05-17 DIAGNOSIS — E119 Type 2 diabetes mellitus without complications: Secondary | ICD-10-CM

## 2020-05-17 DIAGNOSIS — L409 Psoriasis, unspecified: Secondary | ICD-10-CM | POA: Diagnosis not present

## 2020-05-17 DIAGNOSIS — Z86010 Personal history of colonic polyps: Secondary | ICD-10-CM | POA: Diagnosis not present

## 2020-05-17 DIAGNOSIS — R0609 Other forms of dyspnea: Secondary | ICD-10-CM

## 2020-05-17 NOTE — Patient Instructions (Addendum)
Use hand cream everyday--cetaphil, cerve, o'keefes   Try tea with small amount of honey at night for cough. Or also a lozenger prior to bedtime.   Please schedule with Dr. Valentina Lucks for pulmonary function tests (PFTS).

## 2020-05-17 NOTE — Progress Notes (Signed)
    SUBJECTIVE:   CHIEF COMPLAINT / HPI: Follow up  Mr. Waide is a 60 year old gentleman presenting for follow-up.  Fatigue/mental fogginess: Unchanged since last visit.  Had echo done on 2/4, however read has not been completed.  His recent lab work returned unremarkable.  Hoarseness: Started after covid in 03/2019.  Reports it is probably alittle better since onset.  Denies any pain with speaking, but feels like he has to force his voice.  Will have a "something stuck in his throat "sensation at night and then will have coughing fits that make it better.  He denies any odynophagia, halitosis, or dysphagia, able to eat and drink without concern.  Has not felt any mass in his neck.  He is a previous smoker with an approximate 13-pack-year history, quit around age 56.  Hand rash: Felt to be likely from his psoriasis.  Significantly improved from last visit after using his steroid cream.  Does not use daily lotion.   PERTINENT  PMH / PSH: Hypertension, bilateral DVTs in 12/2020during COVID hospitalization, COVID-19 pneumonia in 03/2019, remote history of drug/alcohol abuse, psoriasis, hyperlipidemia, adjustment disorder  OBJECTIVE:   BP 128/82   Pulse 71   Ht 6' (1.829 m)   Wt 254 lb (115.2 kg)   SpO2 95%   BMI 34.45 kg/m   General: Alert, NAD HEENT: NCAT, MMM, oropharynx nonerythematous without any mucosal lesions, tonsillar hypertrophy or exudate.  Thyroid nonenlarged without palpation of nodules. Cardiac: RRR  Lungs: No increased WOB  Msk: Moves all extremities spontaneously  Derm: 2 dry patches, approximately 2 cm,  present on the palmar surface of his left hand, improved from last exam Ext: Warm, dry  ASSESSMENT/PLAN:   Dyspnea on exertion Associated with his known fatigue as a long-term manifestation of COVID-19 from 03/2019.  Awaiting recent echocardiogram results.  Recommended scheduling PFTs with Dr. Valentina Lucks in our clinic in the next few weeks.  Psoriasis Currently with  mild flare on his left hand.  Resolving with high potency steroid.  May continue for 1 additional week, encouraged use of daily hand cream to limit exacerbations.  Hoarseness, persistent Developed after COVID-19 infection in 03/2019 and unchanged since onset, likely initially laryngitis and now possible long-term vocal cord dysfunction.  Will place referral to ENT for formal evaluation.  In the interim, encouraged adequate hydration and cough drops as needed.  History of colonic polyps numerous and large hyperplastics Due for repeat colonoscopy in 12/2020.  DM (diabetes mellitus), type 2 (HCC) Tolerating increase of Metformin well.  Will continue with 1000 mg as patient is hesitant to increase from there, will recheck A1c in 3 months or sooner as needed with considerations on increasing to full 2000 mg dose.    Follow-up approximately 2 weeks after PFTs or sooner if needed.  [name], [place]

## 2020-05-18 ENCOUNTER — Encounter: Payer: Self-pay | Admitting: Family Medicine

## 2020-05-18 DIAGNOSIS — R490 Dysphonia: Secondary | ICD-10-CM

## 2020-05-18 HISTORY — DX: Dysphonia: R49.0

## 2020-05-18 NOTE — Assessment & Plan Note (Signed)
Tolerating increase of Metformin well.  Will continue with 1000 mg as patient is hesitant to increase from there, will recheck A1c in 3 months or sooner as needed with considerations on increasing to full 2000 mg dose.

## 2020-05-18 NOTE — Assessment & Plan Note (Signed)
Associated with his known fatigue as a long-term manifestation of COVID-19 from 03/2019.  Awaiting recent echocardiogram results.  Recommended scheduling PFTs with Dr. Valentina Lucks in our clinic in the next few weeks.

## 2020-05-18 NOTE — Assessment & Plan Note (Signed)
Developed after COVID-19 infection in 03/2019 and unchanged since onset, likely initially laryngitis and now possible long-term vocal cord dysfunction.  Will place referral to ENT for formal evaluation.  In the interim, encouraged adequate hydration and cough drops as needed.

## 2020-05-18 NOTE — Assessment & Plan Note (Signed)
Due for repeat colonoscopy in 12/2020.

## 2020-05-18 NOTE — Assessment & Plan Note (Signed)
Currently with mild flare on his left hand.  Resolving with high potency steroid.  May continue for 1 additional week, encouraged use of daily hand cream to limit exacerbations.

## 2020-05-20 LAB — ECHOCARDIOGRAM COMPLETE
Area-P 1/2: 3.21 cm2
S' Lateral: 3.1 cm

## 2020-05-23 ENCOUNTER — Other Ambulatory Visit: Payer: Self-pay | Admitting: Family Medicine

## 2020-05-23 DIAGNOSIS — F4321 Adjustment disorder with depressed mood: Secondary | ICD-10-CM

## 2020-05-24 ENCOUNTER — Other Ambulatory Visit: Payer: Self-pay | Admitting: Family Medicine

## 2020-05-24 ENCOUNTER — Other Ambulatory Visit: Payer: Self-pay

## 2020-05-24 ENCOUNTER — Ambulatory Visit (INDEPENDENT_AMBULATORY_CARE_PROVIDER_SITE_OTHER): Payer: PPO | Admitting: Pharmacist

## 2020-05-24 ENCOUNTER — Encounter: Payer: Self-pay | Admitting: Pharmacist

## 2020-05-24 DIAGNOSIS — R5382 Chronic fatigue, unspecified: Secondary | ICD-10-CM

## 2020-05-24 DIAGNOSIS — R0609 Other forms of dyspnea: Secondary | ICD-10-CM

## 2020-05-24 DIAGNOSIS — U099 Post covid-19 condition, unspecified: Secondary | ICD-10-CM

## 2020-05-24 DIAGNOSIS — R0600 Dyspnea, unspecified: Secondary | ICD-10-CM | POA: Diagnosis not present

## 2020-05-24 LAB — PULMONARY FUNCTION TEST

## 2020-05-24 MED ORDER — BENZONATATE 100 MG PO CAPS
100.0000 mg | ORAL_CAPSULE | Freq: Two times a day (BID) | ORAL | 1 refills | Status: DC | PRN
Start: 2020-05-24 — End: 2020-09-25

## 2020-05-24 NOTE — Assessment & Plan Note (Signed)
Patient has been experiencing shortness of breath with exertion since coivd infection in 03/2019. Spirometry evaluation without bronchodilator reveals excellent ability to move air quickly with an FEV1 that is 92% predicted.  His FEF25-75 was 192% predicted.  We discussed how the reduced FVC may be related to deconditioning.   -Reviewed results of pulmonary function tests.  Pt verbalized understanding of results and education. - No changes in medication therapy at the visit today.  -Encouraged to increase exercise frequency and also to look for increasing the complexity/difficulty.  He was open to the suggestion of returning to exercise at the [facility] in the near future.

## 2020-05-24 NOTE — Telephone Encounter (Signed)
Patient stopped by with the name of medicine that he is taking  Benzonatate 100 mg. He states you wanted to know any questions reach out to patient P#[phone]
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
## 2020-05-24 NOTE — Patient Instructions (Addendum)
Nice to meet you today.   Your lung function test was near normal.  Please try to get busy, being active.   Going back to the [facility] may be something to consider.   Next visit with Dr. Higinio Plan in 2-4 weeks.

## 2020-05-24 NOTE — Progress Notes (Signed)
Reviewed: I agree with Dr. Graylin Shiver documentation and assessment.

## 2020-06-11 ENCOUNTER — Encounter: Payer: Self-pay | Admitting: Family Medicine

## 2020-06-11 ENCOUNTER — Encounter (INDEPENDENT_AMBULATORY_CARE_PROVIDER_SITE_OTHER): Payer: Self-pay | Admitting: Otolaryngology

## 2020-06-11 ENCOUNTER — Ambulatory Visit (INDEPENDENT_AMBULATORY_CARE_PROVIDER_SITE_OTHER): Payer: PPO | Admitting: Family Medicine

## 2020-06-11 ENCOUNTER — Ambulatory Visit (INDEPENDENT_AMBULATORY_CARE_PROVIDER_SITE_OTHER): Payer: PPO | Admitting: Otolaryngology

## 2020-06-11 ENCOUNTER — Other Ambulatory Visit: Payer: Self-pay

## 2020-06-11 VITALS — Temp 95.9°F

## 2020-06-11 VITALS — BP 136/84 | HR 86 | Ht 69.0 in | Wt 249.0 lb

## 2020-06-11 DIAGNOSIS — R5382 Chronic fatigue, unspecified: Secondary | ICD-10-CM | POA: Diagnosis not present

## 2020-06-11 DIAGNOSIS — L409 Psoriasis, unspecified: Secondary | ICD-10-CM

## 2020-06-11 DIAGNOSIS — R0600 Dyspnea, unspecified: Secondary | ICD-10-CM

## 2020-06-11 DIAGNOSIS — E119 Type 2 diabetes mellitus without complications: Secondary | ICD-10-CM | POA: Diagnosis not present

## 2020-06-11 DIAGNOSIS — K219 Gastro-esophageal reflux disease without esophagitis: Secondary | ICD-10-CM | POA: Diagnosis not present

## 2020-06-11 DIAGNOSIS — R490 Dysphonia: Secondary | ICD-10-CM

## 2020-06-11 DIAGNOSIS — U099 Post covid-19 condition, unspecified: Secondary | ICD-10-CM | POA: Diagnosis not present

## 2020-06-11 DIAGNOSIS — R0609 Other forms of dyspnea: Secondary | ICD-10-CM
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
## 2020-06-11 NOTE — Progress Notes (Signed)
    SUBJECTIVE:   CHIEF COMPLAINT / HPI: Check-in  Cameron Diaz is a 60 year old male presenting for follow-up and to discuss the following:  Dyspnea on exertion/fatigue: Echo came back with preserved EF and otherwise unremarkable.  Recently had PFTs without significant abnormality as well.  He went to [facility] for the first time in a while this week and did 5 miles on the bicycle. Slower than previous but did without stopping.  He is unsure of how far he can walk now prior to being short of breath, but states he has gotten used to his limit.  Still endorses mental fogginess which is his most bothersome symptom.  Hoarseness: Unchanged.  Scheduled with ENT, cannot remember when his appointment is.  Psoriasis: Current flare on the palm of his hand, significantly improved since last visit.  Uses a regular lotion on his hands every day.  PERTINENT  PMH / PSH: Hypertension, bilateral DVTs in 12/2020during COVID hospitalization, COVID-19 pneumonia in 03/2019, remote history of drug/alcohol abuse, psoriasis, hyperlipidemia, adjustment disorder  OBJECTIVE:   BP 136/84   Pulse 86   Ht 5\' 9"  (1.753 m)   Wt 249 lb (112.9 kg)   SpO2 98%   BMI 36.77 kg/m   General: Alert, NAD HEENT: NCAT, MMM, mild hoarseness with voice present Lungs: No increased WOB  Msk: Moves all extremities spontaneously, normal gait    ASSESSMENT/PLAN:   Hoarseness, persistent ENT evaluation scheduled for later today with Dr. Lucia Gaskins, informed patient of time and location.  Dyspnea on exertion Chronic, stable since 03/2019 after COVID-19 infection.  Overall reassuring by negative work-up thus far including echo, PFTs, and lab work.  Additionally, able to do 5 miles on stationary bike without concern, thus will continue to monitor and encouraged stepwise increase in physical activity to combat deconditioning.   Long haul COVID-19 symptoms, manifesting in mental fogginess Will touch base with OT to assess if he may benefit from  cognitive retraining.  Encouraged increasing physical activity as above, maintaining hydration, and socially engaging.  Recommended keeping notes and journals as to keep track of daily tasks.  T2DM A1c 7.8 on 05/07/2020, next due at end of 07/2020.  Continue metformin 1000 mg and lifestyle modifications as per patient preference.  Consider increase to 2000 mg daily if next A1c persistently elevated.  Encourage patient to have updated eye evaluation.  Psoriasis Hand flare significantly improved.  Encouraged hand cream vs ointment rather than lotion for adequate skin hydration.  Follow-up in approximately 2 months, A1c at that time, or sooner if needed.   Cameron Diaz, [place]

## 2020-06-11 NOTE — Patient Instructions (Addendum)
Dr. Lucia Gaskins, ENT  Address: [address] [place], [place] [address] Phone: [phone]  O'keeffe's skin repair, hand cream. Preferred "cream or ointment".   I will reach out to OT to see if they could provide any cognitive re-training to help. Please give one week for me to reach out and if you havent heard please call me.

## 2020-06-12 ENCOUNTER — Encounter: Payer: Self-pay | Admitting: Family Medicine

## 2020-08-11 DIAGNOSIS — H16423 Pannus (corneal), bilateral: Secondary | ICD-10-CM | POA: Diagnosis not present

## 2020-08-11 DIAGNOSIS — H0102B Squamous blepharitis left eye, upper and lower eyelids: Secondary | ICD-10-CM | POA: Diagnosis not present

## 2020-08-11 DIAGNOSIS — H0102A Squamous blepharitis right eye, upper and lower eyelids: Secondary | ICD-10-CM | POA: Diagnosis not present

## 2020-08-11 DIAGNOSIS — E119 Type 2 diabetes mellitus without complications: Secondary | ICD-10-CM | POA: Diagnosis not present

## 2020-08-11 DIAGNOSIS — H16003 Unspecified corneal ulcer, bilateral: Secondary | ICD-10-CM | POA: Diagnosis not present

## 2020-08-16 DIAGNOSIS — H0102B Squamous blepharitis left eye, upper and lower eyelids: Secondary | ICD-10-CM | POA: Diagnosis not present

## 2020-08-16 DIAGNOSIS — H16423 Pannus (corneal), bilateral: Secondary | ICD-10-CM | POA: Diagnosis not present

## 2020-08-16 DIAGNOSIS — H0102A Squamous blepharitis right eye, upper and lower eyelids: Secondary | ICD-10-CM | POA: Diagnosis not present

## 2020-08-16 DIAGNOSIS — H16003 Unspecified corneal ulcer, bilateral: Secondary | ICD-10-CM | POA: Diagnosis not present

## 2020-08-16 DIAGNOSIS — E119 Type 2 diabetes mellitus without complications: Secondary | ICD-10-CM | POA: Diagnosis not present

## 2020-08-16 LAB — HM DIABETES EYE EXAM

## 2020-08-18 ENCOUNTER — Ambulatory Visit: Payer: PPO | Admitting: Family Medicine

## 2020-08-18 DIAGNOSIS — E119 Type 2 diabetes mellitus without complications: Secondary | ICD-10-CM | POA: Diagnosis not present

## 2020-08-18 DIAGNOSIS — M129 Arthropathy, unspecified: Secondary | ICD-10-CM | POA: Diagnosis not present

## 2020-08-18 DIAGNOSIS — M25511 Pain in right shoulder: Secondary | ICD-10-CM | POA: Diagnosis not present

## 2020-08-18 DIAGNOSIS — E785 Hyperlipidemia, unspecified: Secondary | ICD-10-CM | POA: Diagnosis not present

## 2020-08-18 DIAGNOSIS — M25512 Pain in left shoulder: Secondary | ICD-10-CM | POA: Diagnosis not present

## 2020-08-18 DIAGNOSIS — G8929 Other chronic pain: Secondary | ICD-10-CM | POA: Diagnosis not present

## 2020-08-20 DIAGNOSIS — M25512 Pain in left shoulder: Secondary | ICD-10-CM | POA: Diagnosis not present

## 2020-08-20 DIAGNOSIS — I10 Essential (primary) hypertension: Secondary | ICD-10-CM | POA: Diagnosis not present

## 2020-08-20 DIAGNOSIS — M25511 Pain in right shoulder: Secondary | ICD-10-CM | POA: Diagnosis not present

## 2020-08-20 DIAGNOSIS — G8929 Other chronic pain: Secondary | ICD-10-CM | POA: Diagnosis not present

## 2020-08-20 DIAGNOSIS — M19019 Primary osteoarthritis, unspecified shoulder: Secondary | ICD-10-CM | POA: Diagnosis not present

## 2020-08-30 DIAGNOSIS — H16003 Unspecified corneal ulcer, bilateral: Secondary | ICD-10-CM | POA: Diagnosis not present

## 2020-08-30 DIAGNOSIS — H40013 Open angle with borderline findings, low risk, bilateral: Secondary | ICD-10-CM | POA: Diagnosis not present

## 2020-08-30 DIAGNOSIS — H16423 Pannus (corneal), bilateral: Secondary | ICD-10-CM | POA: Diagnosis not present

## 2020-08-30 DIAGNOSIS — H0102B Squamous blepharitis left eye, upper and lower eyelids: Secondary | ICD-10-CM | POA: Diagnosis not present

## 2020-08-30 DIAGNOSIS — H2513 Age-related nuclear cataract, bilateral: Secondary | ICD-10-CM | POA: Diagnosis not present

## 2020-08-30 DIAGNOSIS — E119 Type 2 diabetes mellitus without complications: Secondary | ICD-10-CM | POA: Diagnosis not present

## 2020-08-30 DIAGNOSIS — H0102A Squamous blepharitis right eye, upper and lower eyelids: Secondary | ICD-10-CM | POA: Diagnosis not present

## 2020-08-31 DIAGNOSIS — M19019 Primary osteoarthritis, unspecified shoulder: Secondary | ICD-10-CM | POA: Diagnosis not present

## 2020-08-31 DIAGNOSIS — G8929 Other chronic pain: Secondary | ICD-10-CM | POA: Diagnosis not present

## 2020-08-31 DIAGNOSIS — M25511 Pain in right shoulder: Secondary | ICD-10-CM | POA: Diagnosis not present

## 2020-08-31 DIAGNOSIS — M25512 Pain in left shoulder: Secondary | ICD-10-CM | POA: Diagnosis not present

## 2020-09-10 ENCOUNTER — Other Ambulatory Visit: Payer: Self-pay

## 2020-09-10 ENCOUNTER — Encounter: Payer: Self-pay | Admitting: Family Medicine

## 2020-09-10 ENCOUNTER — Ambulatory Visit (INDEPENDENT_AMBULATORY_CARE_PROVIDER_SITE_OTHER): Payer: PPO | Admitting: Family Medicine

## 2020-09-10 ENCOUNTER — Ambulatory Visit (INDEPENDENT_AMBULATORY_CARE_PROVIDER_SITE_OTHER): Payer: PPO

## 2020-09-10 VITALS — BP 128/76 | HR 80 | Ht 69.0 in | Wt 253.0 lb

## 2020-09-10 DIAGNOSIS — Z23 Encounter for immunization: Secondary | ICD-10-CM | POA: Diagnosis not present

## 2020-09-10 DIAGNOSIS — R600 Localized edema: Secondary | ICD-10-CM | POA: Diagnosis not present

## 2020-09-10 DIAGNOSIS — R21 Rash and other nonspecific skin eruption: Secondary | ICD-10-CM | POA: Diagnosis not present

## 2020-09-10 DIAGNOSIS — M542 Cervicalgia: Secondary | ICD-10-CM | POA: Diagnosis not present

## 2020-09-10 DIAGNOSIS — Z0000 Encounter for general adult medical examination without abnormal findings: Secondary | ICD-10-CM

## 2020-09-10 DIAGNOSIS — M25519 Pain in unspecified shoulder: Secondary | ICD-10-CM | POA: Diagnosis not present

## 2020-09-10 DIAGNOSIS — E119 Type 2 diabetes mellitus without complications: Secondary | ICD-10-CM | POA: Diagnosis not present

## 2020-09-10 LAB — POCT GLYCOSYLATED HEMOGLOBIN (HGB A1C): HbA1c, POC (controlled diabetic range): 7.1 % — AB (ref 0.0–7.0)

## 2020-09-10 NOTE — Patient Instructions (Signed)
I hope you start feeling better!   We will get records from [facility] to assess what it happening in reference to your neck/shoulder pain. We may be able to proceed with more pending this.

## 2020-09-10 NOTE — Progress Notes (Signed)
SUBJECTIVE:   CHIEF COMPLAINT / HPI: Neck/shoulder pain   Mr. Girardin is a 60 year old gentleman presenting to discuss the following:  Neck/shoulder pain: Started about 2 months ago insidiously, worse since onset.  No injury or trauma. Seen at [facility] pain clinic Dr. Raliegh Ip for this on 5/11, later in may, and has an appointment in a few weeks.  He reports that he has been working this up with images and wants him to be seen by another specialist, however patient is not sure if this is the rheumatologist or someone else.  Taking Oxy with tylenol 5-325mg  1-2 daily to help with pain.  Pain feels like "Someone has an ice pick" on his anterior/lateral left shoulder, but also will have some right shoulder pain that is less painful. Mainly hurts at night or when driving/using his left arm. Pain gel improves this pain but it will return. It also can ease off on its own, when he isn't using his arm, or when he hangs his arm off the side. Neck will hurt sometimes on the lateral left side, hurts when he is raising his left arm up. Does not make pain worse with turning head side to side. Denies any tingling down the arm. Feeling more weak on the left, feels like less hand grip.  Known history of C6-7 Previous right hemilaminectomy.  Would like COVID Booster and PCV 20 today.   Bilateral lower leg swelling/redness: Mentioned at end of visit.  Present for the past several months.  Known history of venous stasis and with some bilateral swelling.  He also does have a previous history of stasis dermatitis, this is similar to previous.  He also has noticed bilateral redness with itching.  No fever, warmth to touch, puslike drainage, or pain associated with it.  Denies any new chest pain or shortness of breath.  PERTINENT  PMH / PSH: Hypertension, bilateral DVTs in 12/2020during COVID hospitalization, COVID-19 pneumonia in 03/2019, remote history of drug/alcohol abuse, psoriasis, hyperlipidemia, adjustment  disorder  OBJECTIVE:   BP 128/76   Pulse 80   Ht 5\' 9"  (1.753 m)   Wt 253 lb (114.8 kg)   SpO2 98%   BMI 37.36 kg/m   General: Alert, NAD HEENT: NCAT, MMM Lungs: Clear bilaterally, no increased WOB  Ext: Warm, dry, 2+ distal pulses, 1+ pitting edema to midshin bilaterally with mild erythematous rash present on lower leg bilaterally. Neck/Back: - Inspection: no gross deformity or asymmetry, swelling or ecchymosis - Palpation: Non-tender to cervical spinous process, however TTP to left trapezius and lateral paracervical musculature.  - ROM: full active ROM of the cervical spine with neck extension, rotation, flexion - Strength: 5/5 wrist flexion, extension, biceps flexion. 4/5 triceps extension.  - Neuro: sensation intact in the C5-C8 nerve root distribution b/l - Special testing: Negative spurling's Shoulder: Inspection reveals no obvious deformity, atrophy, or asymmetry. No bruising. No swelling Palpation is normal with no TTP over [facility] joint or bicipital groove. Full ROM in flexion, abduction, internal/external rotation NV intact distally Special Tests:  - Impingement: Neg neers, empty can sign. - Supraspinatous: Negative empty can.  5/5 strength with resisted flexion at 20 degrees - Infraspinatous/Teres Minor: 5/5 strength with ER - Subscapularis: 5/5 strength with IR - Labrum: Negative Obriens, good stability - No painful arc and no drop arm sign  Physical Exam Musculoskeletal:       Arms:     Comments: Region of "ice pick" sensation      ASSESSMENT/PLAN:   Neck  and shoulder pain Overall history sounds radicular via cervical spine with anterior nerve pain most consistent with approximate C5 dermatome.  Known history of multilevel spondylosis and foraminal stenosis present on CT cervical in 2018.  Fortunately he is neurovascularly intact with no objective weakness, no abnormality on his shoulder evaluation to suggest alternative etiology.  He is already being evaluated  for this concern by his pain specialist, however it is unclear what has been completed thus far.  Will obtain records from their office to assess, further evaluation pending on this.  Continue Tylenol, Norco, pain cream, ice/heat.  Bilateral leg edema Known history of dependent edema/venous insufficiency.  Given slightly increased compared to his baseline over the past several months, will recheck CBC/CMP.  No concurrent respiratory symptoms with recent echo in 05/2020 with preserved EF, doubt cardiac in etiology.  Encouraged elevation and obtaining compression stockings.  Rx'd triamcinolone for likely stasis dermatitis, no additional s/sx consistent with cellulitis.  DM (diabetes mellitus), type 2 (HCC) Check A1c today.  Healthcare maintenance Administered booster dose of Pfizer vaccine and monitored for 15 minutes without concern.  Counseled on common side effects including but not limited to arm soreness/malaise/headaches.  Additionally received PCV 20.    Follow-up in approximately 2 weeks or sooner if needed.  ED precautions discussed.  [name], [place]

## 2020-09-11 LAB — COMPREHENSIVE METABOLIC PANEL
ALT: 45 IU/L — ABNORMAL HIGH (ref 0–44)
AST: 39 IU/L (ref 0–40)
Albumin/Globulin Ratio: 1.6 (ref 1.2–2.2)
Albumin: 4.4 g/dL (ref 3.8–4.9)
Alkaline Phosphatase: 105 IU/L (ref 44–121)
BUN/Creatinine Ratio: 14 (ref 9–20)
BUN: 12 mg/dL (ref 6–24)
Bilirubin Total: 0.4 mg/dL (ref 0.0–1.2)
CO2: 20 mmol/L (ref 20–29)
Calcium: 9.6 mg/dL (ref 8.7–10.2)
Chloride: 103 mmol/L (ref 96–106)
Creatinine, Ser: 0.87 mg/dL (ref 0.76–1.27)
Globulin, Total: 2.8 g/dL (ref 1.5–4.5)
Glucose: 182 mg/dL — ABNORMAL HIGH (ref 65–99)
Potassium: 4.3 mmol/L (ref 3.5–5.2)
Sodium: 137 mmol/L (ref 134–144)
Total Protein: 7.2 g/dL (ref 6.0–8.5)
eGFR: 99 mL/min/{1.73_m2} (ref >59)

## 2020-09-11 LAB — CBC WITH DIFFERENTIAL/PLATELET
Basophils Absolute: 0.1 10*3/uL (ref 0.0–0.2)
Basos: 1 %
EOS (ABSOLUTE): 0.3 10*3/uL (ref 0.0–0.4)
Eos: 6 %
Hematocrit: 44.2 % (ref 37.5–51.0)
Hemoglobin: 15.5 g/dL (ref 13.0–17.7)
Immature Grans (Abs): 0 10*3/uL (ref 0.0–0.1)
Immature Granulocytes: 0 %
Lymphocytes Absolute: 1.9 10*3/uL (ref 0.7–3.1)
Lymphs: 35 %
MCH: 30.2 pg (ref 26.6–33.0)
MCHC: 35.1 g/dL (ref 31.5–35.7)
MCV: 86 fL (ref 79–97)
Monocytes Absolute: 0.7 10*3/uL (ref 0.1–0.9)
Monocytes: 12 %
Neutrophils Absolute: 2.4 10*3/uL (ref 1.4–7.0)
Neutrophils: 46 %
Platelets: 186 10*3/uL (ref 150–450)
RBC: 5.13 x10E6/uL (ref 4.14–5.80)
RDW: 13.1 % (ref 11.6–15.4)
WBC: 5.2 10*3/uL (ref 3.4–10.8)

## 2020-09-12 ENCOUNTER — Encounter: Payer: Self-pay | Admitting: Family Medicine

## 2020-09-12 DIAGNOSIS — Z0000 Encounter for general adult medical examination without abnormal findings: Secondary | ICD-10-CM | POA: Insufficient documentation

## 2020-09-12 DIAGNOSIS — M542 Cervicalgia: Secondary | ICD-10-CM | POA: Insufficient documentation

## 2020-09-12 DIAGNOSIS — M25519 Pain in unspecified shoulder: Secondary | ICD-10-CM

## 2020-09-12 HISTORY — DX: Pain in unspecified shoulder: M25.519

## 2020-09-12 MED ORDER — TRIAMCINOLONE ACETONIDE 0.1 % EX OINT: 1.0000 "application " | TOPICAL_OINTMENT | Freq: Two times a day (BID) | CUTANEOUS | 0 refills | Status: DC

## 2020-09-12 NOTE — Assessment & Plan Note (Signed)
Overall history sounds radicular via cervical spine with anterior nerve pain most consistent with approximate C5 dermatome.  Known history of multilevel spondylosis and foraminal stenosis present on CT cervical in 2018.  Fortunately he is neurovascularly intact with no objective weakness, no abnormality on his shoulder evaluation to suggest alternative etiology.  He is already being evaluated for this concern by his pain specialist, however it is unclear what has been completed thus far.  Will obtain records from their office to assess, further evaluation pending on this.  Continue Tylenol, Norco, pain cream, ice/heat.

## 2020-09-12 NOTE — Assessment & Plan Note (Signed)
Known history of dependent edema/venous insufficiency.  Given slightly increased compared to his baseline over the past several months, will recheck CBC/CMP.  No concurrent respiratory symptoms with recent echo in 05/2020 with preserved EF, doubt cardiac in etiology.  Encouraged elevation and obtaining compression stockings.  Rx'd triamcinolone for likely stasis dermatitis, no additional s/sx consistent with cellulitis.

## 2020-09-12 NOTE — Assessment & Plan Note (Signed)
Administered booster dose of Pfizer vaccine and monitored for 15 minutes without concern.  Counseled on common side effects including but not limited to arm soreness/malaise/headaches.  Additionally received PCV 20.

## 2020-09-12 NOTE — Assessment & Plan Note (Signed)
Check A1c today.

## 2020-09-14 DIAGNOSIS — G8929 Other chronic pain: Secondary | ICD-10-CM | POA: Diagnosis not present

## 2020-09-15 ENCOUNTER — Other Ambulatory Visit: Payer: Self-pay | Admitting: Family Medicine

## 2020-09-15 DIAGNOSIS — F1011 Alcohol abuse, in remission: Secondary | ICD-10-CM

## 2020-09-15 NOTE — Progress Notes (Signed)
Red Team-- Can we please get him scheduled for a routine (non-urgent) RUQ U/S to evaluate liver with history of previous alcohol use, mild ALT elevation, and abnormalities on CT?   Thank you!  [name], DO

## 2020-09-16 ENCOUNTER — Telehealth: Payer: Self-pay

## 2020-09-16 NOTE — Telephone Encounter (Signed)
Called and informed patient of appointment for :  Ultrasound at GI [address] [phone] 09/20/2020 0800 w/ arrival 0745  NPO after midnight A sip of water if needed with medication of morning of exam $75 no show fee if not cancelled within 24 hours  .[name], CMA

## 2020-09-20 ENCOUNTER — Ambulatory Visit
Admission: RE | Admit: 2020-09-20 | Discharge: 2020-09-20 | Disposition: A | Discharge status: Home / Self Care | Payer: PPO | Source: Ambulatory Visit | Attending: Family Medicine | Admitting: Family Medicine

## 2020-09-20 ENCOUNTER — Ambulatory Visit: Payer: PPO | Admitting: Family Medicine

## 2020-09-20 DIAGNOSIS — Z811 Family history of alcohol abuse and dependence: Secondary | ICD-10-CM | POA: Diagnosis not present

## 2020-09-20 DIAGNOSIS — F1011 Alcohol abuse, in remission: Secondary | ICD-10-CM

## 2020-09-20 NOTE — Progress Notes (Deleted)
    SUBJECTIVE:   CHIEF COMPLAINT / HPI:    PERTINENT  PMH / PSH:   OBJECTIVE:   There were no vitals taken for this visit.  ***  ASSESSMENT/PLAN:   No problem-specific Assessment & Plan notes found for this encounter.     [name], [place]   {    This will disappear when note is signed, click to select method of visit    :1}

## 2020-09-25 ENCOUNTER — Encounter (HOSPITAL_COMMUNITY): Payer: Self-pay

## 2020-09-25 ENCOUNTER — Ambulatory Visit (HOSPITAL_COMMUNITY)
Admission: EM | Admit: 2020-09-25 | Discharge: 2020-09-25 | Disposition: A | Discharge status: Home / Self Care | Payer: PPO | Attending: Family Medicine | Admitting: Family Medicine

## 2020-09-25 DIAGNOSIS — M25512 Pain in left shoulder: Secondary | ICD-10-CM

## 2020-09-25 DIAGNOSIS — R059 Cough, unspecified: Secondary | ICD-10-CM | POA: Diagnosis not present

## 2020-09-25 DIAGNOSIS — S161XXA Strain of muscle, fascia and tendon at neck level, initial encounter: Secondary | ICD-10-CM | POA: Diagnosis not present

## 2020-09-25 DIAGNOSIS — M25511 Pain in right shoulder: Secondary | ICD-10-CM | POA: Diagnosis not present

## 2020-09-25 MED ORDER — OXYCODONE HCL 5 MG PO TABS: 5.0000 mg | ORAL_TABLET | ORAL | 0 refills | Status: DC | PRN

## 2020-09-25 MED ORDER — BENZONATATE 100 MG PO CAPS: 100.0000 mg | ORAL_CAPSULE | Freq: Three times a day (TID) | ORAL | 0 refills | Status: DC

## 2020-09-25 MED ORDER — CYCLOBENZAPRINE HCL 10 MG PO TABS: 10.0000 mg | ORAL_TABLET | Freq: Two times a day (BID) | ORAL | 0 refills | Status: DC | PRN
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
## 2020-09-25 NOTE — Discharge Instructions (Addendum)
I have sent in oxycodone for you to take one tablet every 4-6 hours as needed for severe pain  I have sent in tessalon perles for you to use one capsule every 8 hours as needed for cough.  May take 800 mg ibuprofen with 1000 mg of Tylenol.  Do not exceed 4000 mg of Tylenol in 24 hours.  I have sent in flexeril for you to take twice a day as needed for muscle spasms. This medication can make you sleepy. Do not drive or operate heavy machinery with this medication.  Follow up with sports medicine

## 2020-10-04 ENCOUNTER — Encounter (HOSPITAL_COMMUNITY): Payer: Self-pay | Admitting: Emergency Medicine

## 2020-10-04 ENCOUNTER — Emergency Department (HOSPITAL_COMMUNITY)
Admission: EM | Admit: 2020-10-04 | Discharge: 2020-10-05 | Disposition: A | Discharge status: Home / Self Care | Payer: PPO | Attending: Emergency Medicine | Admitting: Emergency Medicine

## 2020-10-04 ENCOUNTER — Emergency Department (HOSPITAL_BASED_OUTPATIENT_CLINIC_OR_DEPARTMENT_OTHER)
Admit: 2020-10-04 | Discharge: 2020-10-04 | Disposition: A | Discharge status: Home / Self Care | Payer: PPO | Attending: Emergency Medicine | Admitting: Emergency Medicine

## 2020-10-04 ENCOUNTER — Other Ambulatory Visit: Payer: Self-pay

## 2020-10-04 DIAGNOSIS — Z8616 Personal history of COVID-19: Secondary | ICD-10-CM | POA: Insufficient documentation

## 2020-10-04 DIAGNOSIS — R0609 Other forms of dyspnea: Secondary | ICD-10-CM | POA: Insufficient documentation

## 2020-10-04 DIAGNOSIS — Z7984 Long term (current) use of oral hypoglycemic drugs: Secondary | ICD-10-CM | POA: Diagnosis not present

## 2020-10-04 DIAGNOSIS — R609 Edema, unspecified: Secondary | ICD-10-CM

## 2020-10-04 DIAGNOSIS — Z87891 Personal history of nicotine dependence: Secondary | ICD-10-CM | POA: Diagnosis not present

## 2020-10-04 DIAGNOSIS — I10 Essential (primary) hypertension: Secondary | ICD-10-CM | POA: Insufficient documentation

## 2020-10-04 DIAGNOSIS — E119 Type 2 diabetes mellitus without complications: Secondary | ICD-10-CM | POA: Diagnosis not present

## 2020-10-04 DIAGNOSIS — M7989 Other specified soft tissue disorders: Secondary | ICD-10-CM | POA: Diagnosis not present

## 2020-10-04 DIAGNOSIS — R2243 Localized swelling, mass and lump, lower limb, bilateral: Secondary | ICD-10-CM | POA: Diagnosis not present

## 2020-10-04 DIAGNOSIS — Z79899 Other long term (current) drug therapy: Secondary | ICD-10-CM | POA: Insufficient documentation

## 2020-10-04 LAB — HEPATIC FUNCTION PANEL
ALT: 46 U/L — ABNORMAL HIGH (ref 0–44)
AST: 38 U/L (ref 15–41)
Albumin: 3.6 g/dL (ref 3.5–5.0)
Alkaline Phosphatase: 86 U/L (ref 38–126)
Bilirubin, Direct: 0.2 mg/dL (ref 0.0–0.2)
Indirect Bilirubin: 0.6 mg/dL (ref 0.3–0.9)
Total Bilirubin: 0.8 mg/dL (ref 0.3–1.2)
Total Protein: 7.3 g/dL (ref 6.5–8.1)

## 2020-10-04 LAB — CBC WITH DIFFERENTIAL/PLATELET
Abs Immature Granulocytes: 0.02 10*3/uL (ref 0.00–0.07)
Basophils Absolute: 0.1 10*3/uL (ref 0.0–0.1)
Basophils Relative: 1 %
Eosinophils Absolute: 0.4 10*3/uL (ref 0.0–0.5)
Eosinophils Relative: 6 %
HCT: 42.2 % (ref 39.0–52.0)
Hemoglobin: 15.3 g/dL (ref 13.0–17.0)
Immature Granulocytes: 0 %
Lymphocytes Relative: 42 %
Lymphs Abs: 2.6 10*3/uL (ref 0.7–4.0)
MCH: 30.5 pg (ref 26.0–34.0)
MCHC: 36.3 g/dL — ABNORMAL HIGH (ref 30.0–36.0)
MCV: 84.2 fL (ref 80.0–100.0)
Monocytes Absolute: 0.8 10*3/uL (ref 0.1–1.0)
Monocytes Relative: 12 %
Neutro Abs: 2.5 10*3/uL (ref 1.7–7.7)
Neutrophils Relative %: 39 %
Platelets: 202 10*3/uL (ref 150–400)
RBC: 5.01 MIL/uL (ref 4.22–5.81)
RDW: 12.9 % (ref 11.5–15.5)
WBC: 6.4 10*3/uL (ref 4.0–10.5)
nRBC: 0 % (ref 0.0–0.2)

## 2020-10-04 LAB — BASIC METABOLIC PANEL
Anion gap: 7 (ref 5–15)
BUN: 7 mg/dL (ref 6–20)
CO2: 24 mmol/L (ref 22–32)
Calcium: 9.4 mg/dL (ref 8.9–10.3)
Chloride: 103 mmol/L (ref 98–111)
Creatinine, Ser: 0.85 mg/dL (ref 0.61–1.24)
GFR, Estimated: >60 mL/min (ref >60)
Glucose, Bld: 195 mg/dL — ABNORMAL HIGH (ref 70–99)
Potassium: 4.1 mmol/L (ref 3.5–5.1)
Sodium: 134 mmol/L — ABNORMAL LOW (ref 135–145)

## 2020-10-04 LAB — BRAIN NATRIURETIC PEPTIDE: B Natriuretic Peptide: 24.8 pg/mL (ref 0.0–100.0)

## 2020-10-04 MED ORDER — DOXYCYCLINE HYCLATE 100 MG PO TABS
100.0000 mg | ORAL_TABLET | Freq: Once | ORAL | Status: AC
  Administered 2020-10-04: 100 mg via ORAL
  Filled 2020-10-04: qty 1

## 2020-10-04 MED ORDER — OXYCODONE-ACETAMINOPHEN 5-325 MG PO TABS
1.0000 | ORAL_TABLET | Freq: Once | ORAL | Status: AC
Start: 2020-10-04 — End: 2020-10-04
  Administered 2020-10-04: 1 via ORAL
  Filled 2020-10-04: qty 1

## 2020-10-04 MED ORDER — DOXYCYCLINE HYCLATE 100 MG PO CAPS: 100.0000 mg | ORAL_CAPSULE | Freq: Two times a day (BID) | ORAL | 0 refills | Status: AC

## 2020-10-04 NOTE — ED Provider Notes (Signed)
Emergency Medicine Provider Triage Evaluation Note  Cameron Diaz , a 60 y.o. male  was evaluated in triage.  Pt complains of ble. Worse on the left. Ongoing for 2 weeks. Denies cp, sob, fever.  Review of Systems  Positive: Leg swelling Negative: Chest pain, sob, fever  Physical Exam  BP 139/88 (BP Location: Left Arm)   Pulse 86   Temp 98.6 F (37 C)   Resp 16   SpO2 95%  Gen:   Awake, no distress   Resp:  Normal effort  MSK:   Moves extremities without difficulty  Other:  Ble edema, erythema to the lle  Medical Decision Making  Medically screening exam initiated at 4:57 PM.  Appropriate orders placed.  Cameron Diaz was informed that the remainder of the evaluation will be completed by another provider, this initial triage assessment does not replace that evaluation, and the importance of remaining in the ED until their evaluation is complete.     Rodney Booze, PA-C 10/04/20 1659    Lorelle Gibbs, DO 10/07/20 1116

## 2020-10-04 NOTE — Discharge Instructions (Addendum)
Please take the antibiotic, Doxycycline, every 12 hours until gone. This is to cover for any possible developing skin infection. A side effect of this medication includes hypersensitivity to the suns rays - please take measures to protect your skin from the sun while taking this medication. Apply warm compresses to your leg on that area of pain. Elevate your legs frequently to help with swelling. Limit your salt intake. Follow with your primary care this week for recheck.  Return if you develop fever, redness to your leg, chest pain, shortness of breath, or other concerning symptoms.
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
## 2020-10-04 NOTE — Progress Notes (Signed)
Left lower extremity venous duplex has been completed. Preliminary results can be found in CV Proc through chart review.  Results were given to [employer] PA.  10/04/20 5:26 PM Carlos Levering RVT

## 2020-10-04 NOTE — ED Triage Notes (Signed)
Pt c/o swelling to BLE, worse on the left, and bilateral shoulder pain. Symptoms have been present "for a while". Denies chest pain/shortness of breath.

## 2020-10-06 ENCOUNTER — Encounter: Payer: Self-pay | Admitting: Family Medicine

## 2020-10-06 DIAGNOSIS — K7460 Unspecified cirrhosis of liver: Secondary | ICD-10-CM | POA: Insufficient documentation
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
## 2020-10-25 ENCOUNTER — Ambulatory Visit: Payer: Self-pay

## 2020-10-25 ENCOUNTER — Encounter: Payer: Self-pay | Admitting: Rheumatology

## 2020-10-25 ENCOUNTER — Ambulatory Visit (INDEPENDENT_AMBULATORY_CARE_PROVIDER_SITE_OTHER): Payer: PPO | Admitting: Rheumatology

## 2020-10-25 ENCOUNTER — Other Ambulatory Visit: Payer: Self-pay

## 2020-10-25 VITALS — BP 131/76 | HR 87 | Ht 68.0 in | Wt 255.4 lb

## 2020-10-25 DIAGNOSIS — Z86010 Personal history of colonic polyps: Secondary | ICD-10-CM

## 2020-10-25 DIAGNOSIS — H16009 Unspecified corneal ulcer, unspecified eye: Secondary | ICD-10-CM

## 2020-10-25 DIAGNOSIS — F1011 Alcohol abuse, in remission: Secondary | ICD-10-CM

## 2020-10-25 DIAGNOSIS — E119 Type 2 diabetes mellitus without complications: Secondary | ICD-10-CM

## 2020-10-25 DIAGNOSIS — R5383 Other fatigue: Secondary | ICD-10-CM | POA: Diagnosis not present

## 2020-10-25 DIAGNOSIS — M25562 Pain in left knee: Secondary | ICD-10-CM

## 2020-10-25 DIAGNOSIS — Z86718 Personal history of other venous thrombosis and embolism: Secondary | ICD-10-CM

## 2020-10-25 DIAGNOSIS — L409 Psoriasis, unspecified: Secondary | ICD-10-CM | POA: Diagnosis not present

## 2020-10-25 DIAGNOSIS — G4709 Other insomnia: Secondary | ICD-10-CM

## 2020-10-25 DIAGNOSIS — M79642 Pain in left hand: Secondary | ICD-10-CM | POA: Diagnosis not present

## 2020-10-25 DIAGNOSIS — Z8639 Personal history of other endocrine, nutritional and metabolic disease: Secondary | ICD-10-CM | POA: Diagnosis not present

## 2020-10-25 DIAGNOSIS — F1911 Other psychoactive substance abuse, in remission: Secondary | ICD-10-CM

## 2020-10-25 DIAGNOSIS — Z800 Family history of malignant neoplasm of digestive organs: Secondary | ICD-10-CM

## 2020-10-25 DIAGNOSIS — M25561 Pain in right knee: Secondary | ICD-10-CM

## 2020-10-25 DIAGNOSIS — I10 Essential (primary) hypertension: Secondary | ICD-10-CM | POA: Diagnosis not present

## 2020-10-25 DIAGNOSIS — U071 COVID-19: Secondary | ICD-10-CM

## 2020-10-25 DIAGNOSIS — Z79899 Other long term (current) drug therapy: Secondary | ICD-10-CM

## 2020-10-25 DIAGNOSIS — M79641 Pain in right hand: Secondary | ICD-10-CM | POA: Diagnosis not present

## 2020-10-25 DIAGNOSIS — M5136 Other intervertebral disc degeneration, lumbar region: Secondary | ICD-10-CM

## 2020-10-25 DIAGNOSIS — M5030 Other cervical disc degeneration, unspecified cervical region: Secondary | ICD-10-CM | POA: Diagnosis not present

## 2020-10-25 DIAGNOSIS — M533 Sacrococcygeal disorders, not elsewhere classified: Secondary | ICD-10-CM | POA: Diagnosis not present

## 2020-10-25 DIAGNOSIS — F4321 Adjustment disorder with depressed mood: Secondary | ICD-10-CM

## 2020-10-25 DIAGNOSIS — G8929 Other chronic pain: Secondary | ICD-10-CM | POA: Diagnosis not present

## 2020-10-26 NOTE — Progress Notes (Signed)
Please add hep C RNA quant if it cannot be added have patient come in to get the labs.

## 2020-10-29 LAB — TEST AUTHORIZATION

## 2020-10-29 LAB — HEPATITIS C RNA QUANTITATIVE
HCV Quantitative Log: 6.44 log IU/mL — ABNORMAL HIGH
HCV Quantitative Log: 6.54 Log IU/mL — ABNORMAL HIGH
HCV RNA, PCR, QN: 2770000 IU/mL — ABNORMAL HIGH
HCV RNA, PCR, QN: 3460000 IU/mL — ABNORMAL HIGH

## 2020-10-29 LAB — PROTEIN ELECTROPHORESIS, SERUM, WITH REFLEX
Albumin ELP: 4.1 g/dL (ref 3.8–4.8)
Alpha 1: 0.3 g/dL (ref 0.2–0.3)
Alpha 2: 0.9 g/dL (ref 0.5–0.9)
Beta 2: 0.4 g/dL (ref 0.2–0.5)
Beta Globulin: 0.5 g/dL (ref 0.4–0.6)
Gamma Globulin: 1.4 g/dL (ref 0.8–1.7)
Total Protein: 7.5 g/dL (ref 6.1–8.1)

## 2020-10-29 LAB — CYCLIC CITRUL PEPTIDE ANTIBODY, IGG: Cyclic Citrullin Peptide Ab: <16 UNITS

## 2020-10-29 LAB — CK: Total CK: 103 U/L (ref 44–196)

## 2020-10-29 LAB — QUANTIFERON-TB GOLD PLUS
Mitogen-NIL: >10 IU/mL
NIL: 0.08 IU/mL
QuantiFERON-TB Gold Plus: NEGATIVE
TB1-NIL: <0 IU/mL
TB2-NIL: <0 IU/mL

## 2020-10-29 LAB — HEPATITIS C ANTIBODY
Hepatitis C Ab: REACTIVE — AB
SIGNAL TO CUT-OFF: 31 — ABNORMAL HIGH (ref <1.00)

## 2020-10-29 LAB — SEDIMENTATION RATE: Sed Rate: 2 mm/h (ref 0–20)

## 2020-10-29 LAB — HEPATITIS B SURFACE ANTIGEN: Hepatitis B Surface Ag: NONREACTIVE

## 2020-10-29 LAB — HIV ANTIBODY (ROUTINE TESTING W REFLEX): HIV 1&2 Ab, 4th Generation: NONREACTIVE

## 2020-10-29 LAB — TSH: TSH: 0.42 mIU/L (ref 0.40–4.50)

## 2020-10-29 LAB — IGG, IGA, IGM
IgG (Immunoglobin G), Serum: 1575 mg/dL (ref 600–1640)
IgM, Serum: 72 mg/dL (ref 50–300)
Immunoglobulin A: 399 mg/dL — ABNORMAL HIGH (ref 47–310)

## 2020-10-29 LAB — ANA: Anti Nuclear Antibody (ANA): NEGATIVE

## 2020-10-29 LAB — RHEUMATOID FACTOR: Rheumatoid fact SerPl-aCnc: <14 IU/mL (ref <14)

## 2020-10-29 LAB — HEPATITIS B CORE ANTIBODY, IGM: Hep B C IgM: NONREACTIVE

## 2020-10-30 NOTE — Progress Notes (Signed)
Patient's labs are positive for hepatitis C.  Please notify patient.  Please refer to gastroenterology for evaluation and treatment of hepatitis C.  Please forward lab results to Dr. Annia Belt at [facility] care.

## 2020-11-01 ENCOUNTER — Other Ambulatory Visit: Payer: Self-pay | Admitting: *Deleted

## 2020-11-01 DIAGNOSIS — Z79899 Other long term (current) drug therapy: Secondary | ICD-10-CM

## 2020-11-01 DIAGNOSIS — H16009 Unspecified corneal ulcer, unspecified eye: Secondary | ICD-10-CM

## 2020-11-01 DIAGNOSIS — B1920 Unspecified viral hepatitis C without hepatic coma: Secondary | ICD-10-CM

## 2020-11-01 NOTE — Progress Notes (Signed)
I discussed lab results with the patient.  He wanted to know if there is any treatment for hepatitis C.  I explained to him that GI referral was placed for treatment of hepatitis C.  Patient voiced understanding.
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
## 2020-11-23 ENCOUNTER — Ambulatory Visit: Payer: PPO | Admitting: Rheumatology

## 2020-11-23 ENCOUNTER — Encounter: Payer: Self-pay | Admitting: Rheumatology

## 2020-11-23 ENCOUNTER — Other Ambulatory Visit: Payer: Self-pay

## 2020-11-23 VITALS — BP 132/87 | HR 91 | Ht 71.0 in | Wt 252.0 lb

## 2020-11-23 DIAGNOSIS — Z86010 Personal history of colonic polyps: Secondary | ICD-10-CM

## 2020-11-23 DIAGNOSIS — I10 Essential (primary) hypertension: Secondary | ICD-10-CM | POA: Diagnosis not present

## 2020-11-23 DIAGNOSIS — M160 Bilateral primary osteoarthritis of hip: Secondary | ICD-10-CM | POA: Diagnosis not present

## 2020-11-23 DIAGNOSIS — G4709 Other insomnia: Secondary | ICD-10-CM

## 2020-11-23 DIAGNOSIS — F1011 Alcohol abuse, in remission: Secondary | ICD-10-CM

## 2020-11-23 DIAGNOSIS — F4321 Adjustment disorder with depressed mood: Secondary | ICD-10-CM

## 2020-11-23 DIAGNOSIS — M19041 Primary osteoarthritis, right hand: Secondary | ICD-10-CM

## 2020-11-23 DIAGNOSIS — Z800 Family history of malignant neoplasm of digestive organs: Secondary | ICD-10-CM

## 2020-11-23 DIAGNOSIS — H16009 Unspecified corneal ulcer, unspecified eye: Secondary | ICD-10-CM | POA: Diagnosis not present

## 2020-11-23 DIAGNOSIS — R7989 Other specified abnormal findings of blood chemistry: Secondary | ICD-10-CM

## 2020-11-23 DIAGNOSIS — L409 Psoriasis, unspecified: Secondary | ICD-10-CM

## 2020-11-23 DIAGNOSIS — E119 Type 2 diabetes mellitus without complications: Secondary | ICD-10-CM | POA: Diagnosis not present

## 2020-11-23 DIAGNOSIS — M170 Bilateral primary osteoarthritis of knee: Secondary | ICD-10-CM

## 2020-11-23 DIAGNOSIS — M5136 Other intervertebral disc degeneration, lumbar region: Secondary | ICD-10-CM | POA: Diagnosis not present

## 2020-11-23 DIAGNOSIS — M5030 Other cervical disc degeneration, unspecified cervical region: Secondary | ICD-10-CM

## 2020-11-23 DIAGNOSIS — Z86718 Personal history of other venous thrombosis and embolism: Secondary | ICD-10-CM

## 2020-11-23 DIAGNOSIS — B1920 Unspecified viral hepatitis C without hepatic coma: Secondary | ICD-10-CM | POA: Diagnosis not present

## 2020-11-23 DIAGNOSIS — F1911 Other psychoactive substance abuse, in remission: Secondary | ICD-10-CM

## 2020-11-23 DIAGNOSIS — M19042 Primary osteoarthritis, left hand: Secondary | ICD-10-CM

## 2020-11-23 DIAGNOSIS — Z8639 Personal history of other endocrine, nutritional and metabolic disease: Secondary | ICD-10-CM

## 2020-11-23 NOTE — Patient Instructions (Addendum)
Dr. Benson [place]: [phone] Hand Exercises Hand exercises can be helpful for almost anyone. These exercises can strengthen the hands, improve flexibility and movement, and increase blood flow to the hands. These results can make work and daily tasks easier. Hand exercises can be especially helpful for people who have joint pain from arthritis or have nerve damage from overuse (carpal tunnel syndrome). These exercises can also help people who have injured a hand. Exercises Most of these hand exercises are gentle stretching and motion exercises. It is usually safe to do them often throughout the day. Warming up your hands before exercise may help to reduce stiffness. You can do this with gentle massage orby placing your hands in warm water for 10-15 minutes. It is normal to feel some stretching, pulling, tightness, or mild discomfort as you begin new exercises. This will gradually improve. Stop an exercise right away if you feel sudden, severe pain or your pain gets worse. Ask your healthcare provider which exercises are best for you. Knuckle bend or "claw" fist Stand or sit with your arm, hand, and all five fingers pointed straight up. Make sure to keep your wrist straight during the exercise. Gently bend your fingers down toward your palm until the tips of your fingers are touching the top of your palm. Keep your big knuckle straight and just bend the small knuckles in your fingers. Hold this position for __________ seconds. Straighten (extend) your fingers back to the starting position. Repeat this exercise 5-10 times with each hand. Full finger fist Stand or sit with your arm, hand, and all five fingers pointed straight up. Make sure to keep your wrist straight during the exercise. Gently bend your fingers into your palm until the tips of your fingers are touching the middle of your palm. Hold this position for __________ seconds. Extend your fingers back to the starting position, stretching every  joint fully. Repeat this exercise 5-10 times with each hand. Straight fist Stand or sit with your arm, hand, and all five fingers pointed straight up. Make sure to keep your wrist straight during the exercise. Gently bend your fingers at the big knuckle, where your fingers meet your hand, and the middle knuckle. Keep the knuckle at the tips of your fingers straight and try to touch the bottom of your palm. Hold this position for __________ seconds. Extend your fingers back to the starting position, stretching every joint fully. Repeat this exercise 5-10 times with each hand. Tabletop Stand or sit with your arm, hand, and all five fingers pointed straight up. Make sure to keep your wrist straight during the exercise. Gently bend your fingers at the big knuckle, where your fingers meet your hand, as far down as you can while keeping the small knuckles in your fingers straight. Think of forming a tabletop with your fingers. Hold this position for __________ seconds. Extend your fingers back to the starting position, stretching every joint fully. Repeat this exercise 5-10 times with each hand. Finger spread Place your hand flat on a table with your palm facing down. Make sure your wrist stays straight as you do this exercise. Spread your fingers and thumb apart from each other as far as you can until you feel a gentle stretch. Hold this position for __________ seconds. Bring your fingers and thumb tight together again. Hold this position for __________ seconds. Repeat this exercise 5-10 times with each hand. Making circles Stand or sit with your arm, hand, and all five fingers pointed straight up. Make  sure to keep your wrist straight during the exercise. Make a circle by touching the tip of your thumb to the tip of your index finger. Hold for __________ seconds. Then open your hand wide. Repeat this motion with your thumb and each finger on your hand. Repeat this exercise 5-10 times with each  hand. Thumb motion Sit with your forearm resting on a table and your wrist straight. Your thumb should be facing up toward the ceiling. Keep your fingers relaxed as you move your thumb. Lift your thumb up as high as you can toward the ceiling. Hold for __________ seconds. Bend your thumb across your palm as far as you can, reaching the tip of your thumb for the small finger (pinkie) side of your palm. Hold for __________ seconds. Repeat this exercise 5-10 times with each hand. Grip strengthening  Hold a stress ball or other soft ball in the middle of your hand. Slowly increase the pressure, squeezing the ball as much as you can without causing pain. Think of bringing the tips of your fingers into the middle of your palm. All of your finger joints should bend when doing this exercise. Hold your squeeze for __________ seconds, then relax. Repeat this exercise 5-10 times with each hand. Contact a health care provider if: Your hand pain or discomfort gets much worse when you do an exercise. Your hand pain or discomfort does not improve within 2 hours after you exercise. If you have any of these problems, stop doing these exercises right away. Do not do them again unless your health care provider says that you can. Get help right away if: You develop sudden, severe hand pain or swelling. If this happens, stop doing these exercises right away. Do not do them again unless your health care provider says that you can. This information is not intended to replace advice given to you by your health care provider. Make sure you discuss any questions you have with your healthcare provider. Document Revised: 07/18/2018 Document Reviewed: 03/28/2018 Elsevier Patient Education  [place]. Hip Exercises Ask your health care provider which exercises are safe for you. Do exercises exactly as told by your health care provider and adjust them as directed. It is normal to feel mild stretching, pulling,  tightness, or discomfort as you do these exercises. Stop right away if you feel sudden pain or your pain gets worse. Do not begin these exercises until told by your health care provider. Stretching and range-of-motion exercises These exercises warm up your muscles and joints and improve the movement and flexibility of your hip. These exercises also help to relieve pain, numbness, and tingling. You may be asked to limit your range of motion if you had a hipreplacement. Talk to your health care provider about these restrictions. Hamstrings, supine  Lie on your back (supine position). Loop a belt or towel over the ball of your left / right foot. The ball of your foot is on the walking surface, right under your toes. Straighten your left / right knee and slowly pull on the belt or towel to raise your leg until you feel a gentle stretch behind your knee (hamstring). Do not let your knee bend while you do this. Keep your other leg flat on the floor. Hold this position for __________ seconds. Slowly return your leg to the starting position. Repeat __________ times. Complete this exercise __________ times a day. Hip rotation  Lie on your back on a firm surface. With your left / right  hand, gently pull your left / right knee toward the shoulder that is on the same side of the body. Stop when your knee is pointing toward the ceiling. Hold your left / right ankle with your other hand. Keeping your knee steady, gently pull your left / right ankle toward your other shoulder until you feel a stretch in your buttocks. Keep your hips and shoulders firmly planted while you do this stretch. Hold this position for __________ seconds. Repeat __________ times. Complete this exercise __________ times a day. Seated stretch This exercise is sometimes called hamstrings and adductors stretch. Sit on the floor with your legs stretched wide. Keep your knees straight during this exercise. Keeping your head and back in a  straight line, bend at your waist to reach for your left foot (position A). You should feel a stretch in your right inner thigh (adductors). Hold this position for __________ seconds. Then slowly return to the upright position. Keeping your head and back in a straight line, bend at your waist to reach forward (position B). You should feel a stretch behind both of your thighs and knees (hamstrings). Hold this position for __________ seconds. Then slowly return to the upright position. Keeping your head and back in a straight line, bend at your waist to reach for your right foot (position C). You should feel a stretch in your left inner thigh (adductors). Hold this position for __________ seconds. Then slowly return to the upright position. Repeat __________ times. Complete this exercise __________ times a day. Lunge This exercise stretches the muscles of the hip (hip flexors). Place your left / right knee on the floor and bend your other knee so that is directly over your ankle. You should be half-kneeling. Keep good posture with your head over your shoulders. Tighten your buttocks to point your tailbone downward. This will prevent your back from arching too much. You should feel a gentle stretch in the front of your left / right thigh and hip. If you do not feel a stretch, slide your other foot forward slightly and then slowly lunge forward with your chest up until your knee once again lines up over your ankle. Make sure your tailbone continues to point downward. Hold this position for __________ seconds. Slowly return to the starting position. Repeat __________ times. Complete this exercise __________ times a day. Strengthening exercises These exercises build strength and endurance in your hip. Endurance is theability to use your muscles for a long time, even after they get tired. Bridge This exercise strengthens the muscles of your hip (hip extensors). Lie on your back on a firm surface with  your knees bent and your feet flat on the floor. Tighten your buttocks muscles and lift your bottom off the floor until the trunk of your body and your hips are level with your thighs. Do not arch your back. You should feel the muscles working in your buttocks and the back of your thighs. If you do not feel these muscles, slide your feet 1-2 inches (2.5-5 cm) farther away from your buttocks. Hold this position for __________ seconds. Slowly lower your hips to the starting position. Let your muscles relax completely between repetitions. Repeat __________ times. Complete this exercise __________ times a day. Straight leg raises, side-lying This exercise strengthens the muscles that move the hip joint away from the center of the body (hip abductors). Lie on your side with your left / right leg in the top position. Lie so your head, shoulder, hip, and  knee line up. You may bend your bottom knee slightly to help you balance. Roll your hips slightly forward, so your hips are stacked directly over each other and your left / right knee is facing forward. Leading with your heel, lift your top leg 4-6 inches (10-15 cm). You should feel the muscles in your top hip lifting. Do not let your foot drift forward. Do not let your knee roll toward the ceiling. Hold this position for __________ seconds. Slowly return to the starting position. Let your muscles relax completely between repetitions. Repeat __________ times. Complete this exercise __________ times a day. Straight leg raises, side-lying This exercise strengthens the muscles that move the hip joint toward the center of the body (hip adductors). Lie on your side with your left / right leg in the bottom position. Lie so your head, shoulder, hip, and knee line up. You may place your upper foot in front to help you balance. Roll your hips slightly forward, so your hips are stacked directly over each other and your left / right knee is facing  forward. Tense the muscles in your inner thigh and lift your bottom leg 4-6 inches (10-15 cm). Hold this position for __________ seconds. Slowly return to the starting position. Let your muscles relax completely between repetitions. Repeat __________ times. Complete this exercise __________ times a day. Straight leg raises, supine This exercise strengthens the muscles in the front of your thigh (quadriceps). Lie on your back (supine position) with your left / right leg extended and your other knee bent. Tense the muscles in the front of your left / right thigh. You should see your kneecap slide up or see increased dimpling just above your knee. Keep these muscles tight as you raise your leg 4-6 inches (10-15 cm) off the floor. Do not let your knee bend. Hold this position for __________ seconds. Keep these muscles tense as you lower your leg. Relax the muscles slowly and completely between repetitions. Repeat __________ times. Complete this exercise __________ times a day. Hip abductors, standing This exercise strengthens the muscles that move the leg and hip joint away from the center of the body (hip abductors). Tie one end of a rubber exercise band or tubing to a secure surface, such as a chair, table, or pole. Loop the other end of the band or tubing around your left / right ankle. Keeping your ankle with the band or tubing directly opposite the secured end, step away until there is tension in the tubing or band. Hold on to a chair, table, or pole as needed for balance. Lift your left / right leg out to your side. While you do this: Keep your back upright. Keep your shoulders over your hips. Keep your toes pointing forward. Make sure to use your hip muscles to slowly lift your leg. Do not tip your body or forcefully lift your leg. Hold this position for __________ seconds. Slowly return to the starting position. Repeat __________ times. Complete this exercise __________ times a  day. Squats This exercise strengthens the muscles in the front of your thigh (quadriceps). Stand in a door frame so your feet and knees are in line with the frame. You may place your hands on the frame for balance. Slowly bend your knees and lower your hips like you are going to sit in a chair. Keep your lower legs in a straight-up-and-down position. Do not let your hips go lower than your knees. Do not bend your knees lower than told by  your health care provider. If your hip pain increases, do not bend as low. Hold this position for ___________ seconds. Slowly push with your legs to return to standing. Do not use your hands to pull yourself to standing. Repeat __________ times. Complete this exercise __________ times a day. This information is not intended to replace advice given to you by your health care provider. Make sure you discuss any questions you have with your healthcare provider. Document Revised: 10/31/2018 Document Reviewed: 02/05/2018 Elsevier Patient Education  2022 [place] [employer], 15(4), [phone]. Retrieved January 14, 2018 from [email or web address].com/[email or web address]">  Knee Exercises Ask your health care provider which exercises are safe for you. Do exercises exactly as told by your health care provider and adjust them as directed. It is normal to feel mild stretching, pulling, tightness, or discomfort as you do these exercises. Stop right away if you feel sudden pain or your pain gets worse. Do not begin these exercises until told by your health care provider. Stretching and range-of-motion exercises These exercises warm up your muscles and joints and improve the movement and flexibility of your knee. These exercises also help to relieve pain andswelling. Knee extension, prone Lie on your abdomen (prone position) on a bed. Place your left / right knee just beyond the edge of the surface so your knee is not on the bed. You can put a towel under your left  / right thigh just above your kneecap for comfort. Relax your leg muscles and allow gravity to straighten your knee (extension). You should feel a stretch behind your left / right knee. Hold this position for __________ seconds. Scoot up so your knee is supported between repetitions. Repeat __________ times. Complete this exercise __________ times a day. Knee flexion, active  Lie on your back with both legs straight. If this causes back discomfort, bend your left / right knee so your foot is flat on the floor. Slowly slide your left / right heel back toward your buttocks. Stop when you feel a gentle stretch in the front of your knee or thigh (flexion). Hold this position for __________ seconds. Slowly slide your left / right heel back to the starting position. Repeat __________ times. Complete this exercise __________ times a day. Quadriceps stretch, prone  Lie on your abdomen on a firm surface, such as a bed or padded floor. Bend your left / right knee and hold your ankle. If you cannot reach your ankle or pant leg, loop a belt around your foot and grab the belt instead. Gently pull your heel toward your buttocks. Your knee should not slide out to the side. You should feel a stretch in the front of your thigh and knee (quadriceps). Hold this position for __________ seconds. Repeat __________ times. Complete this exercise __________ times a day. Hamstring, supine Lie on your back (supine position). Loop a belt or towel over the ball of your left / right foot. The ball of your foot is on the walking surface, right under your toes. Straighten your left / right knee and slowly pull on the belt to raise your leg until you feel a gentle stretch behind your knee (hamstring). Do not let your knee bend while you do this. Keep your other leg flat on the floor. Hold this position for __________ seconds. Repeat __________ times. Complete this exercise __________ times a day. Strengthening  exercises These exercises build strength and endurance in your knee. Endurance is theability to use your muscles for a long time,  even after they get tired. Quadriceps, isometric This exercise stretches the muscles in front of your thigh (quadriceps) without moving your knee joint (isometric). Lie on your back with your left / right leg extended and your other knee bent. Put a rolled towel or small pillow under your knee if told by your health care provider. Slowly tense the muscles in the front of your left / right thigh. You should see your kneecap slide up toward your hip or see increased dimpling just above the knee. This motion will push the back of the knee toward the floor. For __________ seconds, hold the muscle as tight as you can without increasing your pain. Relax the muscles slowly and completely. Repeat __________ times. Complete this exercise __________ times a day. Straight leg raises This exercise stretches the muscles in front of your thigh (quadriceps) and the muscles that move your hips (hip flexors). Lie on your back with your left / right leg extended and your other knee bent. Tense the muscles in the front of your left / right thigh. You should see your kneecap slide up or see increased dimpling just above the knee. Your thigh may even shake a bit. Keep these muscles tight as you raise your leg 4-6 inches (10-15 cm) off the floor. Do not let your knee bend. Hold this position for __________ seconds. Keep these muscles tense as you lower your leg. Relax your muscles slowly and completely after each repetition. Repeat __________ times. Complete this exercise __________ times a day. Hamstring, isometric Lie on your back on a firm surface. Bend your left / right knee about __________ degrees. Dig your left / right heel into the surface as if you are trying to pull it toward your buttocks. Tighten the muscles in the back of your thighs (hamstring) to "dig" as hard as you can  without increasing any pain. Hold this position for __________ seconds. Release the tension gradually and allow your muscles to relax completely for __________ seconds after each repetition. Repeat __________ times. Complete this exercise __________ times a day. Hamstring curls If told by your health care provider, do this exercise while wearing ankle weights. Begin with __________ lb weights. Then increase the weight by 1 lb (0.5 kg) increments. Do not wear ankle weights that are more than __________ lb. Lie on your abdomen with your legs straight. Bend your left / right knee as far as you can without feeling pain. Keep your hips flat against the floor. Hold this position for __________ seconds. Slowly lower your leg to the starting position. Repeat __________ times. Complete this exercise __________ times a day. Squats This exercise strengthens the muscles in front of your thigh and knee (quadriceps). Stand in front of a table, with your feet and knees pointing straight ahead. You may rest your hands on the table for balance but not for support. Slowly bend your knees and lower your hips like you are going to sit in a chair. Keep your weight over your heels, not over your toes. Keep your lower legs upright so they are parallel with the table legs. Do not let your hips go lower than your knees. Do not bend lower than told by your health care provider. If your knee pain increases, do not bend as low. Hold the squat position for __________ seconds. Slowly push with your legs to return to standing. Do not use your hands to pull yourself to standing. Repeat __________ times. Complete this exercise __________ times a day. Wall slides  This exercise strengthens the muscles in front of your thigh and knee (quadriceps). Lean your back against a smooth wall or door, and walk your feet out 18-24 inches (46-61 cm) from it. Place your feet hip-width apart. Slowly slide down the wall or door until your  knees bend __________ degrees. Keep your knees over your heels, not over your toes. Keep your knees in line with your hips. Hold this position for __________ seconds. Repeat __________ times. Complete this exercise __________ times a day. Straight leg raises This exercise strengthens the muscles that rotate the leg at the hip and move it away from your body (hip abductors). Lie on your side with your left / right leg in the top position. Lie so your head, shoulder, knee, and hip line up. You may bend your bottom knee to help you keep your balance. Roll your hips slightly forward so your hips are stacked directly over each other and your left / right knee is facing forward. Leading with your heel, lift your top leg 4-6 inches (10-15 cm). You should feel the muscles in your outer hip lifting. Do not let your foot drift forward. Do not let your knee roll toward the ceiling. Hold this position for __________ seconds. Slowly return your leg to the starting position. Let your muscles relax completely after each repetition. Repeat __________ times. Complete this exercise __________ times a day. Straight leg raises This exercise stretches the muscles that move your hips away from the front of the pelvis (hip extensors). Lie on your abdomen on a firm surface. You can put a pillow under your hips if that is more comfortable. Tense the muscles in your buttocks and lift your left / right leg about 4-6 inches (10-15 cm). Keep your knee straight as you lift your leg. Hold this position for __________ seconds. Slowly lower your leg to the starting position. Let your leg relax completely after each repetition. Repeat __________ times. Complete this exercise __________ times a day. This information is not intended to replace advice given to you by your health care provider. Make sure you discuss any questions you have with your healthcare provider. Document Revised: 01/15/2018 Document Reviewed:  01/15/2018 Elsevier Patient Education  2022 [employer].

## 2021-01-12 DIAGNOSIS — H2513 Age-related nuclear cataract, bilateral: Secondary | ICD-10-CM | POA: Diagnosis not present

## 2021-01-12 DIAGNOSIS — H0102B Squamous blepharitis left eye, upper and lower eyelids: Secondary | ICD-10-CM | POA: Diagnosis not present

## 2021-01-12 DIAGNOSIS — H16423 Pannus (corneal), bilateral: Secondary | ICD-10-CM | POA: Diagnosis not present

## 2021-01-12 DIAGNOSIS — H16003 Unspecified corneal ulcer, bilateral: Secondary | ICD-10-CM | POA: Diagnosis not present

## 2021-01-12 DIAGNOSIS — H0102A Squamous blepharitis right eye, upper and lower eyelids: Secondary | ICD-10-CM | POA: Diagnosis not present

## 2021-02-01 DIAGNOSIS — R768 Other specified abnormal immunological findings in serum: Secondary | ICD-10-CM | POA: Diagnosis not present

## 2021-02-01 DIAGNOSIS — Z7901 Long term (current) use of anticoagulants: Secondary | ICD-10-CM | POA: Diagnosis not present

## 2021-02-05 ENCOUNTER — Ambulatory Visit (HOSPITAL_COMMUNITY)
Admission: EM | Admit: 2021-02-05 | Discharge: 2021-02-05 | Disposition: A | Discharge status: Home / Self Care | Payer: PPO | Attending: Emergency Medicine | Admitting: Emergency Medicine

## 2021-02-05 ENCOUNTER — Encounter (HOSPITAL_COMMUNITY): Payer: Self-pay

## 2021-02-05 ENCOUNTER — Other Ambulatory Visit: Payer: Self-pay

## 2021-02-05 DIAGNOSIS — B349 Viral infection, unspecified: Secondary | ICD-10-CM | POA: Diagnosis not present

## 2021-02-05 LAB — POC INFLUENZA A AND B ANTIGEN (URGENT CARE ONLY)
INFLUENZA A ANTIGEN, POC: NEGATIVE
INFLUENZA B ANTIGEN, POC: NEGATIVE

## 2021-02-05 NOTE — ED Triage Notes (Signed)
Pt is c/o of cough, sore throat, body aches, and sneezing for the past two days
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
## 2021-02-05 NOTE — Discharge Instructions (Signed)
You can take Tylenol and/or Ibuprofen as needed for fever reduction and pain relief.   For cough: honey 1/2 to 1 teaspoon (you can dilute the honey in water or another fluid).  You can also use guaifenesin and dextromethorphan for cough. You can use a humidifier for chest congestion and cough.  If you don't have a humidifier, you can sit in the bathroom with the hot shower running.     For sore throat: try warm salt water gargles, cepacol lozenges, throat spray, warm tea or water with lemon/honey, popsicles or ice, or OTC cold relief medicine for throat discomfort.    For congestion: take a daily anti-histamine like Zyrtec, Claritin, and a oral decongestant, such as pseudoephedrine.  You can also use Flonase 1-2 sprays in each nostril daily.    It is important to stay hydrated: drink plenty of fluids (water, gatorade/powerade/pedialyte, juices, or teas) to keep your throat moisturized and help further relieve irritation/discomfort.   Return or go to the Emergency Department if symptoms worsen or do not improve in the next few days.

## 2021-02-19 ENCOUNTER — Encounter (HOSPITAL_COMMUNITY): Payer: Self-pay

## 2021-02-19 ENCOUNTER — Other Ambulatory Visit: Payer: Self-pay

## 2021-02-19 ENCOUNTER — Ambulatory Visit (HOSPITAL_COMMUNITY)
Admission: EM | Admit: 2021-02-19 | Discharge: 2021-02-19 | Disposition: A | Discharge status: Home / Self Care | Payer: PPO | Attending: Emergency Medicine | Admitting: Emergency Medicine

## 2021-02-19 DIAGNOSIS — R52 Pain, unspecified: Secondary | ICD-10-CM | POA: Diagnosis not present

## 2021-02-19 DIAGNOSIS — R051 Acute cough: Secondary | ICD-10-CM

## 2021-02-19 DIAGNOSIS — M545 Low back pain, unspecified: Secondary | ICD-10-CM | POA: Diagnosis not present

## 2021-02-19 LAB — POC INFLUENZA A AND B ANTIGEN (URGENT CARE ONLY)
INFLUENZA A ANTIGEN, POC: NEGATIVE
INFLUENZA B ANTIGEN, POC: NEGATIVE

## 2021-02-19 MED ORDER — AZITHROMYCIN 250 MG PO TABS: 250.0000 mg | ORAL_TABLET | Freq: Every day | ORAL | 0 refills | Status: DC

## 2021-02-19 MED ORDER — ACETAMINOPHEN 500 MG PO TABS: 1000.0000 mg | ORAL_TABLET | Freq: Three times a day (TID) | ORAL | 2 refills | Status: AC

## 2021-02-19 MED ORDER — MELOXICAM 15 MG PO TBDP: 15.0000 mg | ORAL_TABLET | Freq: Every day | ORAL | 2 refills | Status: DC
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
## 2021-02-19 NOTE — ED Triage Notes (Signed)
Pt reports cough, generalized body aches, lower back pain x 2 weeks. States back pain is worse when walking.

## 2021-02-19 NOTE — Discharge Instructions (Addendum)
Your rapid flu test today is negative.  Please take azithromycin as directed for upper respiratory symptoms.  I recommend that you begin taking meloxicam once daily however your wife refuses to allow you to take this medication.  Meloxicam is a safe medication that can be taken daily.  Medication such as ibuprofen and naproxen are not safe to take daily for long-term treatment.  I provided you with a prescription for Tylenol, please take 2 tablets 3 times daily for your pain.  This medication works better if you take it regularly as opposed to only as needed.  Please follow-up with your primary care provider to discuss referral to pain management as well as to discuss your blood pressure.

## 2021-03-09 DIAGNOSIS — B182 Chronic viral hepatitis C: Secondary | ICD-10-CM | POA: Diagnosis not present

## 2021-03-09 DIAGNOSIS — R768 Other specified abnormal immunological findings in serum: Secondary | ICD-10-CM | POA: Diagnosis not present

## 2021-03-28 DIAGNOSIS — H179 Unspecified corneal scar and opacity: Secondary | ICD-10-CM | POA: Diagnosis not present

## 2021-03-28 DIAGNOSIS — H16423 Pannus (corneal), bilateral: Secondary | ICD-10-CM | POA: Diagnosis not present

## 2021-03-28 DIAGNOSIS — H0102B Squamous blepharitis left eye, upper and lower eyelids: Secondary | ICD-10-CM | POA: Diagnosis not present

## 2021-03-28 DIAGNOSIS — E119 Type 2 diabetes mellitus without complications: Secondary | ICD-10-CM | POA: Diagnosis not present

## 2021-03-28 DIAGNOSIS — H40013 Open angle with borderline findings, low risk, bilateral: Secondary | ICD-10-CM | POA: Diagnosis not present

## 2021-03-28 DIAGNOSIS — H2513 Age-related nuclear cataract, bilateral: Secondary | ICD-10-CM | POA: Diagnosis not present

## 2021-03-28 DIAGNOSIS — H0102A Squamous blepharitis right eye, upper and lower eyelids: Secondary | ICD-10-CM | POA: Diagnosis not present

## 2021-03-30 ENCOUNTER — Other Ambulatory Visit: Payer: Self-pay

## 2021-03-30 ENCOUNTER — Ambulatory Visit (INDEPENDENT_AMBULATORY_CARE_PROVIDER_SITE_OTHER): Payer: PPO | Admitting: Family Medicine

## 2021-03-30 ENCOUNTER — Encounter: Payer: Self-pay | Admitting: Family Medicine

## 2021-03-30 VITALS — BP 124/89 | HR 109 | Temp 100.2°F | Ht 71.0 in

## 2021-03-30 DIAGNOSIS — J069 Acute upper respiratory infection, unspecified: Secondary | ICD-10-CM

## 2021-03-30 DIAGNOSIS — R509 Fever, unspecified: Secondary | ICD-10-CM | POA: Diagnosis not present

## 2021-03-30 DIAGNOSIS — E119 Type 2 diabetes mellitus without complications: Secondary | ICD-10-CM

## 2021-03-30 DIAGNOSIS — G894 Chronic pain syndrome: Secondary | ICD-10-CM

## 2021-03-30 DIAGNOSIS — J029 Acute pharyngitis, unspecified: Secondary | ICD-10-CM

## 2021-03-30 DIAGNOSIS — M542 Cervicalgia: Secondary | ICD-10-CM

## 2021-03-30 DIAGNOSIS — F1011 Alcohol abuse, in remission: Secondary | ICD-10-CM

## 2021-03-30 DIAGNOSIS — F1911 Other psychoactive substance abuse, in remission: Secondary | ICD-10-CM

## 2021-03-30 DIAGNOSIS — U071 COVID-19: Secondary | ICD-10-CM | POA: Diagnosis not present

## 2021-03-30 DIAGNOSIS — B1920 Unspecified viral hepatitis C without hepatic coma: Secondary | ICD-10-CM

## 2021-03-30 DIAGNOSIS — M25519 Pain in unspecified shoulder: Secondary | ICD-10-CM

## 2021-03-30 LAB — POCT GLYCOSYLATED HEMOGLOBIN (HGB A1C): HbA1c, POC (controlled diabetic range): 11.5 % — AB (ref 0.0–7.0)

## 2021-03-30 LAB — POCT INFLUENZA A/B
Influenza A, POC: NEGATIVE
Influenza B, POC: NEGATIVE

## 2021-03-30 MED ORDER — NAPROXEN 500 MG PO TABS
500.0000 mg | ORAL_TABLET | Freq: Two times a day (BID) | ORAL | 0 refills | Status: DC
Start: 2021-03-30 — End: 2022-09-27

## 2021-03-30 MED ORDER — LIDOCAINE 5 % EX PTCH: 1.0000 | MEDICATED_PATCH | CUTANEOUS | 0 refills | Status: DC

## 2021-03-30 NOTE — Patient Instructions (Addendum)
It was wonderful to meet you today. Thank you for allowing me to be a part of your care. Below is a short summary of what we discussed at your visit today:  Cough, rib pain from coughing - tessalon perls as needed to relieve cough - mucinex tablets up to twice daily as needed to loosen phlegm - continue using honey, ginger, drinking pineapple juice - tylenol for uncomfortable fever, chills, rigors - continue to take in plenty of fluids and get rest  Chronic pain - I referred you to the pain clinic - start with lidocaine patch, naproxen, and tylenol as needed for pain - referral to PT  Hep C treatment Please see your GI doctor about the medication to treat your Hep C:  Dr. Linward Headland, [phone], [facility]   Please bring all of your medications to every appointment!  If you have any questions or concerns, please do not hesitate to contact us via phone or MyChart message.   Ezequiel Essex, MD
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
## 2021-03-31 LAB — NOVEL CORONAVIRUS, NAA: SARS-CoV-2, NAA: DETECTED — AB

## 2021-03-31 LAB — SARS-COV-2, NAA 2 DAY TAT

## 2021-04-01 ENCOUNTER — Telehealth: Payer: Self-pay | Admitting: Family Medicine

## 2021-04-01 ENCOUNTER — Encounter: Payer: Self-pay | Admitting: Family Medicine

## 2021-04-01 DIAGNOSIS — B1920 Unspecified viral hepatitis C without hepatic coma: Secondary | ICD-10-CM | POA: Insufficient documentation

## 2021-04-01 MED ORDER — MOLNUPIRAVIR EUA 200MG CAPSULE: 4.0000 | ORAL_CAPSULE | Freq: Two times a day (BID) | ORAL | 0 refills | Status: AC

## 2021-04-01 NOTE — Progress Notes (Signed)
COVID(+). Will prescribe molnupiravir 800 mg BID x 5 days. Not ideal candidate for paxlovid given currently untreated Hepatitis C and hepatic cirrhosis. Since he is of child-bearing potential, will discuss with patient to either abstain from intercourse or use reliable contraception for 3 months after completing the antiviral.   Ezequiel Essex, MD

## 2021-04-01 NOTE — Telephone Encounter (Signed)
Tried to call patient, no answer. Left HIPAA safe VM. Sent MyChart message contain information.  Ezequiel Essex, MD

## 2021-04-02 DIAGNOSIS — J069 Acute upper respiratory infection, unspecified: Secondary | ICD-10-CM | POA: Insufficient documentation

## 2021-04-02 NOTE — Assessment & Plan Note (Signed)
Acute, 24-hour duration. Patient currently afebrile at visit.  Rapid flu A/B negative.  Suspect viral etiology, no evidence of bacterial infection such as pneumonia.  Will await COVID testing to return.  Recommend conservative management of symptoms, see AVS for more.

## 2021-04-02 NOTE — Assessment & Plan Note (Signed)
Has been prescribed Mavyret by Dr. Linward Headland of [place].  Per mail in pharmacy, the medication will be mailed to the GI clinic so the patient can take his loading dose under supervision.  Recommended patient call the GI clinic for further questions.

## 2021-04-02 NOTE — Assessment & Plan Note (Signed)
Chronic, gradually worsening.  Patient requests referral to pain clinic, referral placed today.  Patient also requests oxycodone, his wife reports that she gave him some of her oxycodone and it is the only thing that has worked for his pain.  Declined to prescribe oxycodone today, discussed potential for abuse, heightened by patient's history of alcohol and drug abuse.  Rx naproxen, lidocaine patch and recommended conservative OTC treatments first.  See AVS for more.

## 2021-04-06 ENCOUNTER — Ambulatory Visit: Payer: PPO | Admitting: Family Medicine

## 2021-04-14 ENCOUNTER — Encounter: Payer: Self-pay | Admitting: Physical Medicine and Rehabilitation

## 2021-04-28 ENCOUNTER — Encounter: Payer: Self-pay | Admitting: Internal Medicine
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
## 2021-05-11 DIAGNOSIS — K7460 Unspecified cirrhosis of liver: Secondary | ICD-10-CM | POA: Diagnosis not present

## 2021-05-11 DIAGNOSIS — B182 Chronic viral hepatitis C: Secondary | ICD-10-CM | POA: Diagnosis not present

## 2021-05-13 ENCOUNTER — Ambulatory Visit (INDEPENDENT_AMBULATORY_CARE_PROVIDER_SITE_OTHER): Payer: Medicare HMO | Admitting: Physician Assistant

## 2021-05-13 ENCOUNTER — Encounter: Payer: Self-pay | Admitting: Physician Assistant

## 2021-05-13 ENCOUNTER — Other Ambulatory Visit: Payer: Self-pay

## 2021-05-13 VITALS — BP 142/94 | HR 96 | Ht 71.0 in | Wt 238.4 lb

## 2021-05-13 DIAGNOSIS — M170 Bilateral primary osteoarthritis of knee: Secondary | ICD-10-CM | POA: Diagnosis not present

## 2021-05-13 DIAGNOSIS — M19041 Primary osteoarthritis, right hand: Secondary | ICD-10-CM

## 2021-05-13 DIAGNOSIS — M19042 Primary osteoarthritis, left hand: Secondary | ICD-10-CM

## 2021-05-13 DIAGNOSIS — M5136 Other intervertebral disc degeneration, lumbar region: Secondary | ICD-10-CM | POA: Diagnosis not present

## 2021-05-13 DIAGNOSIS — B1920 Unspecified viral hepatitis C without hepatic coma: Secondary | ICD-10-CM

## 2021-05-13 DIAGNOSIS — G4709 Other insomnia: Secondary | ICD-10-CM

## 2021-05-13 DIAGNOSIS — E119 Type 2 diabetes mellitus without complications: Secondary | ICD-10-CM

## 2021-05-13 DIAGNOSIS — I10 Essential (primary) hypertension: Secondary | ICD-10-CM

## 2021-05-13 DIAGNOSIS — F4321 Adjustment disorder with depressed mood: Secondary | ICD-10-CM

## 2021-05-13 DIAGNOSIS — H16009 Unspecified corneal ulcer, unspecified eye: Secondary | ICD-10-CM | POA: Diagnosis not present

## 2021-05-13 DIAGNOSIS — R7989 Other specified abnormal findings of blood chemistry: Secondary | ICD-10-CM

## 2021-05-13 DIAGNOSIS — F1011 Alcohol abuse, in remission: Secondary | ICD-10-CM

## 2021-05-13 DIAGNOSIS — Z860100 Personal history of colon polyps, unspecified: Secondary | ICD-10-CM

## 2021-05-13 DIAGNOSIS — Z800 Family history of malignant neoplasm of digestive organs: Secondary | ICD-10-CM

## 2021-05-13 DIAGNOSIS — L409 Psoriasis, unspecified: Secondary | ICD-10-CM

## 2021-05-13 DIAGNOSIS — M51369 Other intervertebral disc degeneration, lumbar region without mention of lumbar back pain or lower extremity pain: Secondary | ICD-10-CM

## 2021-05-13 DIAGNOSIS — M5030 Other cervical disc degeneration, unspecified cervical region: Secondary | ICD-10-CM | POA: Diagnosis not present

## 2021-05-13 DIAGNOSIS — M160 Bilateral primary osteoarthritis of hip: Secondary | ICD-10-CM

## 2021-05-13 DIAGNOSIS — F1911 Other psychoactive substance abuse, in remission: Secondary | ICD-10-CM

## 2021-05-13 DIAGNOSIS — Z8639 Personal history of other endocrine, nutritional and metabolic disease: Secondary | ICD-10-CM

## 2021-05-13 DIAGNOSIS — Z86718 Personal history of other venous thrombosis and embolism: Secondary | ICD-10-CM

## 2021-05-13 NOTE — Addendum Note (Signed)
Addended by: Earnestine Mealing on: 05/13/2021 01:16 PM   Modules accepted: Orders

## 2021-05-24 DIAGNOSIS — E559 Vitamin D deficiency, unspecified: Secondary | ICD-10-CM | POA: Diagnosis not present

## 2021-05-24 DIAGNOSIS — D539 Nutritional anemia, unspecified: Secondary | ICD-10-CM | POA: Diagnosis not present

## 2021-05-24 DIAGNOSIS — E538 Deficiency of other specified B group vitamins: Secondary | ICD-10-CM | POA: Diagnosis not present

## 2021-05-24 DIAGNOSIS — E612 Magnesium deficiency: Secondary | ICD-10-CM | POA: Diagnosis not present

## 2021-05-24 DIAGNOSIS — M545 Low back pain, unspecified: Secondary | ICD-10-CM | POA: Diagnosis not present

## 2021-05-24 DIAGNOSIS — E119 Type 2 diabetes mellitus without complications: Secondary | ICD-10-CM | POA: Diagnosis not present

## 2021-05-24 DIAGNOSIS — Z79899 Other long term (current) drug therapy: Secondary | ICD-10-CM | POA: Diagnosis not present

## 2021-05-24 DIAGNOSIS — M129 Arthropathy, unspecified: Secondary | ICD-10-CM | POA: Diagnosis not present

## 2021-05-24 DIAGNOSIS — B182 Chronic viral hepatitis C: Secondary | ICD-10-CM | POA: Diagnosis not present

## 2021-05-24 DIAGNOSIS — R5383 Other fatigue: Secondary | ICD-10-CM | POA: Diagnosis not present

## 2021-05-24 DIAGNOSIS — G8929 Other chronic pain: Secondary | ICD-10-CM | POA: Diagnosis not present

## 2021-05-26 ENCOUNTER — Ambulatory Visit: Payer: PPO | Admitting: Rheumatology

## 2021-05-26 DIAGNOSIS — Z79899 Other long term (current) drug therapy: Secondary | ICD-10-CM | POA: Diagnosis not present

## 2021-05-31 ENCOUNTER — Other Ambulatory Visit: Payer: Self-pay | Admitting: *Deleted

## 2021-05-31 DIAGNOSIS — I10 Essential (primary) hypertension: Secondary | ICD-10-CM

## 2021-05-31 MED ORDER — LISINOPRIL 20 MG PO TABS: 20.0000 mg | ORAL_TABLET | Freq: Every day | ORAL | 1 refills | Status: DC

## 2021-05-31 MED ORDER — METOPROLOL TARTRATE 25 MG PO TABS: 25.0000 mg | ORAL_TABLET | Freq: Two times a day (BID) | ORAL | 3 refills | Status: DC

## 2021-05-31 MED ORDER — AMLODIPINE BESYLATE 5 MG PO TABS: 5.0000 mg | ORAL_TABLET | Freq: Every day | ORAL | 0 refills | Status: DC

## 2021-05-31 NOTE — Telephone Encounter (Signed)
Patient was removed from [facility] patient panel by mistake after an urgent care visit in 2022.  He was assigned to Dr. Arby Barrette prior, so he has been given back to her.  He is needing a refill of his medications as he has been out of his blood pressure medication since 05/26/21.  They have been pended and sent to MD.  Cameron Diaz

## 2021-06-03 ENCOUNTER — Ambulatory Visit (AMBULATORY_SURGERY_CENTER): Payer: PPO | Admitting: *Deleted

## 2021-06-03 ENCOUNTER — Other Ambulatory Visit: Payer: Self-pay

## 2021-06-03 VITALS — Ht 71.0 in | Wt 225.0 lb

## 2021-06-03 DIAGNOSIS — Z86010 Personal history of colonic polyps: Secondary | ICD-10-CM

## 2021-06-03 NOTE — Progress Notes (Signed)
No egg or soy allergy known to patient  No issues known to pt with past sedation with any surgeries or procedures Patient denies ever being told they had issues or difficulty with intubation  No FH of Malignant Hyperthermia Pt is not on diet pills Pt is not on  home 02  Pt is not on blood thinners  Pt denies issues with constipation  No A fib or A flutter  Pt is vaccinated  for Covid  Due to the COVID-19 pandemic we are asking patients to follow certain guidelines in PV and the [place]   Pt aware of COVID protocols and [facility] guidelines   PV completed over the phone. Pt verified name, DOB, address and insurance during PV today.  Pt mailed instruction packet with copy of consent form to read and not return, and instructions.  Pt encouraged to call with questions or issues.  If pt has My chart, procedure instructions sent via My Chart    Sample sheet of over the counter items to purchase sent.  Colonoscopy classification sent with packet.

## 2021-06-09 DIAGNOSIS — Z6834 Body mass index (BMI) 34.0-34.9, adult: Secondary | ICD-10-CM | POA: Diagnosis not present

## 2021-06-09 DIAGNOSIS — M129 Arthropathy, unspecified: Secondary | ICD-10-CM | POA: Diagnosis not present

## 2021-06-09 DIAGNOSIS — M545 Low back pain, unspecified: Secondary | ICD-10-CM | POA: Diagnosis not present

## 2021-06-09 DIAGNOSIS — E119 Type 2 diabetes mellitus without complications: Secondary | ICD-10-CM | POA: Diagnosis not present

## 2021-06-09 DIAGNOSIS — I10 Essential (primary) hypertension: Secondary | ICD-10-CM | POA: Diagnosis not present

## 2021-06-09 DIAGNOSIS — G8929 Other chronic pain: Secondary | ICD-10-CM | POA: Diagnosis not present

## 2021-06-09 DIAGNOSIS — Z79899 Other long term (current) drug therapy: Secondary | ICD-10-CM | POA: Diagnosis not present

## 2021-06-14 ENCOUNTER — Encounter: Payer: Self-pay | Admitting: Internal Medicine

## 2021-06-17 ENCOUNTER — Encounter: Payer: PPO | Admitting: Internal Medicine

## 2021-06-20 ENCOUNTER — Ambulatory Visit (INDEPENDENT_AMBULATORY_CARE_PROVIDER_SITE_OTHER): Payer: Medicare HMO | Admitting: Family Medicine

## 2021-06-20 ENCOUNTER — Other Ambulatory Visit: Payer: Self-pay

## 2021-06-20 ENCOUNTER — Encounter: Payer: Self-pay | Admitting: Certified Registered Nurse Anesthetist

## 2021-06-20 VITALS — BP 134/82 | HR 86 | Wt 241.4 lb

## 2021-06-20 DIAGNOSIS — H16423 Pannus (corneal), bilateral: Secondary | ICD-10-CM | POA: Diagnosis not present

## 2021-06-20 DIAGNOSIS — B1920 Unspecified viral hepatitis C without hepatic coma: Secondary | ICD-10-CM | POA: Diagnosis not present

## 2021-06-20 DIAGNOSIS — I10 Essential (primary) hypertension: Secondary | ICD-10-CM

## 2021-06-20 DIAGNOSIS — E119 Type 2 diabetes mellitus without complications: Secondary | ICD-10-CM

## 2021-06-20 DIAGNOSIS — H179 Unspecified corneal scar and opacity: Secondary | ICD-10-CM | POA: Diagnosis not present

## 2021-06-20 DIAGNOSIS — H0102A Squamous blepharitis right eye, upper and lower eyelids: Secondary | ICD-10-CM | POA: Diagnosis not present

## 2021-06-20 DIAGNOSIS — H2513 Age-related nuclear cataract, bilateral: Secondary | ICD-10-CM | POA: Diagnosis not present

## 2021-06-20 DIAGNOSIS — H0102B Squamous blepharitis left eye, upper and lower eyelids: Secondary | ICD-10-CM | POA: Diagnosis not present

## 2021-06-20 LAB — POCT GLYCOSYLATED HEMOGLOBIN (HGB A1C): HbA1c, POC (controlled diabetic range): 8.4 % — AB (ref 0.0–7.0)

## 2021-06-20 NOTE — Patient Instructions (Signed)
Was great seeing you today! ? ?I will call you back with your A1c results, and your blood pressure looks good today so we will not make any changes to your medication.  Given that you had to leave for another appointment at the same time, we will follow-up in about a month to do your full physical. ? ?Feel free to call with any questions or concerns at any time, at [phone]. ?  ?Take care,  ?Dr. Shary Key ?[place]  ?

## 2021-06-20 NOTE — Progress Notes (Unsigned)
SUBJECTIVE:   CHIEF COMPLAINT / HPI:   Has another appointment at this same time.   Currently on Amlodipine and Lisinopriol   States he is done taking the [place] it was an 8 week treatment   Does feel better from lat visit but still doesn't have sense of sell   Colonscopy tomorrow.   Had flaming red eyeballs   Hepatitis C test positive - Patient is under the care at [facility].  He had his most recent office visit with Dr. Eber Jones on 05/11/2021 so we called to obtain these records and recent lab results.  He was started on Mavyret on 04/05/21.  He has been tolerating it without any side effects.   CMP was updated on 05/11/2021: LFTs within normal limits.  Alk phos 125, glucose 242, sodium 140, potassium 4.7, creatinine 0.8, and GFR was 114.  Hepatitis C quant is pending. They will be updating an ultrasound of the abdomen for [place] screening. AFP was WNL.  He will be following back up with Dr. Eber Jones in 1 month.     PERTINENT  PMH / PSH: ***  OBJECTIVE:   BP 134/82    Pulse 86    Wt 241 lb 6.4 oz (109.5 kg)    SpO2 97%    BMI 33.67 kg/m    Physical exam General: well appearing, NAD Cardiovascular: RRR, no murmurs Lungs: CTAB. Normal WOB Abdomen: soft, non-distended, non-tender Skin: warm, dry. No edema  ASSESSMENT/PLAN:   No problem-specific Assessment & Plan notes found for this encounter.     [place]

## 2021-06-21 ENCOUNTER — Encounter: Payer: Self-pay | Admitting: Internal Medicine

## 2021-06-21 ENCOUNTER — Ambulatory Visit (AMBULATORY_SURGERY_CENTER): Payer: Medicare HMO | Admitting: Internal Medicine

## 2021-06-21 VITALS — BP 131/89 | HR 79 | Temp 98.2°F | Resp 21 | Ht 71.0 in | Wt 225.0 lb

## 2021-06-21 DIAGNOSIS — K635 Polyp of colon: Secondary | ICD-10-CM | POA: Diagnosis not present

## 2021-06-21 DIAGNOSIS — Z800 Family history of malignant neoplasm of digestive organs: Secondary | ICD-10-CM | POA: Diagnosis not present

## 2021-06-21 DIAGNOSIS — Z86010 Personal history of colonic polyps: Secondary | ICD-10-CM | POA: Diagnosis not present

## 2021-06-21 DIAGNOSIS — D128 Benign neoplasm of rectum: Secondary | ICD-10-CM | POA: Diagnosis not present

## 2021-06-21 DIAGNOSIS — D125 Benign neoplasm of sigmoid colon: Secondary | ICD-10-CM | POA: Diagnosis not present

## 2021-06-21 DIAGNOSIS — D120 Benign neoplasm of cecum: Secondary | ICD-10-CM | POA: Diagnosis not present

## 2021-06-21 DIAGNOSIS — I10 Essential (primary) hypertension: Secondary | ICD-10-CM | POA: Diagnosis not present

## 2021-06-21 DIAGNOSIS — D124 Benign neoplasm of descending colon: Secondary | ICD-10-CM | POA: Diagnosis not present

## 2021-06-21 DIAGNOSIS — K621 Rectal polyp: Secondary | ICD-10-CM | POA: Diagnosis not present

## 2021-06-21 DIAGNOSIS — E119 Type 2 diabetes mellitus without complications: Secondary | ICD-10-CM | POA: Diagnosis not present

## 2021-06-21 NOTE — Patient Instructions (Addendum)
I removed 13 tiny polyps today. ?I will let you know pathology results and when to have another routine colonoscopy by mail and/or My Chart. ? ?I appreciate the opportunity to care for you. ?Gatha Mayer, MD, [facility] ? ? ? ?YOU HAD AN ENDOSCOPIC PROCEDURE TODAY AT [place]:   Refer to the procedure report that was given to you for any specific questions about what was found during the examination.  If the procedure report does not answer your questions, please call your gastroenterologist to clarify.  If you requested that your care partner not be given the details of your procedure findings, then the procedure report has been included in a sealed envelope for you to review at your convenience later. ? ?YOU SHOULD EXPECT: Some feelings of bloating in the abdomen. Passage of more gas than usual.  Walking can help get rid of the air that was put into your GI tract during the procedure and reduce the bloating. If you had a lower endoscopy (such as a colonoscopy or flexible sigmoidoscopy) you may notice spotting of blood in your stool or on the toilet paper. If you underwent a bowel prep for your procedure, you may not have a normal bowel movement for a few days. ? ?Please Note:  You might notice some irritation and congestion in your nose or some drainage.  This is from the oxygen used during your procedure.  There is no need for concern and it should clear up in a day or so. ? ?SYMPTOMS TO REPORT IMMEDIATELY: ? ?Following lower endoscopy (colonoscopy or flexible sigmoidoscopy): ? Excessive amounts of blood in the stool ? Significant tenderness or worsening of abdominal pains ? Swelling of the abdomen that is new, acute ? Fever of 100?F or higher ? ?For urgent or emergent issues, a gastroenterologist can be reached at any hour by calling [phone]. ?Do not use MyChart messaging for urgent concerns.  ? ? ?DIET:  We do recommend a small meal at first, but then you may proceed to your regular  diet.  Drink plenty of fluids but you should avoid alcoholic beverages for 24 hours. ? ?ACTIVITY:  You should plan to take it easy for the rest of today and you should NOT DRIVE or use heavy machinery until tomorrow (because of the sedation medicines used during the test).   ? ?FOLLOW UP: ?Our staff will call the number listed on your records 48-72 hours following your procedure to check on you and address any questions or concerns that you may have regarding the information given to you following your procedure. If we do not reach you, we will leave a message.  We will attempt to reach you two times.  During this call, we will ask if you have developed any symptoms of COVID 19. If you develop any symptoms (ie: fever, flu-like symptoms, shortness of breath, cough etc.) before then, please call [phone].  If you test positive for Covid 19 in the 2 weeks post procedure, please call and report this information to [place].   ? ?If any biopsies were taken you will be contacted by phone or by letter within the next 1-3 weeks.  Please call us at [phone] if you have not heard about the biopsies in 3 weeks.  ? ? ?SIGNATURES/CONFIDENTIALITY: ?You and/or your care partner have signed paperwork which will be entered into your electronic medical record.  These signatures attest to the fact that that the information above on your After Visit Summary  has been reviewed and is understood.  Full responsibility of the confidentiality of this discharge information lies with you and/or your care-partner.  ?

## 2021-06-21 NOTE — Progress Notes (Signed)
I have reviewed the patient's medical history in detail and updated the computerized patient record.

## 2021-06-21 NOTE — Op Note (Signed)
Cameron Diaz ?Patient Name: Cameron Diaz ?Procedure Date: 06/21/2021 3:02 PM ?MRN: [id number] ?Endoscopist: Gatha Mayer , MD ?Age: 61 ?Referring MD:  ?Date of Birth: 03-10-1961 ?Gender: Male ?Account #: [id number] ?Procedure:                Colonoscopy ?Indications:              High risk colon cancer surveillance: Personal  ?                          history of Serrated Polyposis Syndrome, FHx CRCA  ?                          father < 27, Last colonoscopy: 2020 ?Medicines:                Propofol per Anesthesia, Monitored Anesthesia Care ?Procedure:                Pre-Anesthesia Assessment: ?                          - Prior to the procedure, a History and Physical  ?                          was performed, and patient medications and  ?                          allergies were reviewed. The patient's tolerance of  ?                          previous anesthesia was also reviewed. The risks  ?                          and benefits of the procedure and the sedation  ?                          options and risks were discussed with the patient.  ?                          All questions were answered, and informed consent  ?                          was obtained. Prior Anticoagulants: The patient has  ?                          taken no previous anticoagulant or antiplatelet  ?                          agents. ASA Grade Assessment: II - A patient with  ?                          mild systemic disease. After reviewing the risks  ?                          and benefits, the patient was deemed in  ?  satisfactory condition to undergo the procedure. ?                          After obtaining informed consent, the colonoscope  ?                          was passed under direct vision. Throughout the  ?                          procedure, the patient's blood pressure, pulse, and  ?                          oxygen saturations were monitored continuously. The  ?                           Colonoscope was introduced through the anus and  ?                          advanced to the the cecum, identified by  ?                          appendiceal orifice and ileocecal valve. The  ?                          colonoscopy was performed without difficulty. The  ?                          patient tolerated the procedure well. The quality  ?                          of the bowel preparation was good. The ileocecal  ?                          valve, appendiceal orifice, and rectum were  ?                          photographed. ?Scope In: 3:10:31 PM ?Scope Out: 3:31:21 PM ?Scope Withdrawal Time: 0 hours 17 minutes 54 seconds  ?Total Procedure Duration: 0 hours 20 minutes 50 seconds  ?Findings:                 The perianal and digital rectal examinations were  ?                          normal. ?                          Eleven(11) sessile polyps were found in the rectum  ?                          and sigmoid colon. The polyps were diminutive in  ?                          size. These polyps were removed with a cold snare.  ?  Resection and retrieval were complete. Verification  ?                          of patient identification for the specimen was  ?                          done. Estimated blood loss was minimal. There wer  ?                          other diminutive polyps seen - numerous as always -  ?                          too many to remove all. ?                          Two sessile polyps were found in the descending  ?                          colon and cecum. The polyps were 1 to 2 mm in size.  ?                          These polyps were removed with a cold biopsy  ?                          forceps. Resection and retrieval were complete.  ?                          Verification of patient identification for the  ?                          specimen was done. Estimated blood loss was minimal. ?                          The exam was otherwise without abnormality on  ?                           direct and retroflexion views except for tattoo in  ?                          transverse colon. ?Complications:            No immediate complications. ?Estimated Blood Loss:     Estimated blood loss was minimal. ?Impression:               - QIONGE(95) diminutive polyps in the rectum and in  ?                          the sigmoid colon, removed with a cold snare.  ?                          Resected and retrieved. Other diminutive polyps  ?                          remain - given #  have been removing multiple and  ?                          keeping close f/u as more than practical to remove  ?                          each time ?                          - Two 1 to 2 mm polyps in the descending colon and  ?                          in the cecum, removed with a cold biopsy forceps.  ?                          Resected and retrieved. ?                          - The examination was otherwise normal on direct  ?                          and retroflexion views except for tattoo in  ?                          transverse colon. ?                          - Personal history of colonic polyps SSP polyposis  ?                          syndrome + FHx CRCA father < 86. ?Recommendation:           - Patient has a contact number available for  ?                          emergencies. The signs and symptoms of potential  ?                          delayed complications were discussed with the  ?                          patient. Return to normal activities tomorrow.  ?                          Written discharge instructions were provided to the  ?                          patient. ?                          - Resume previous diet. ?                          - Continue present medications. ?                          -  Await pathology results. ?                          - Repeat colonoscopy is recommended for  ?                          surveillance. The colonoscopy date will be  ?                           determined after pathology results from today's  ?                          exam become available for review. ?Gatha Mayer, MD ?06/21/2021 3:43:14 PM ?This report has been signed electronically. ?
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
## 2021-06-21 NOTE — Progress Notes (Signed)
Report given to PACU, vss 

## 2021-06-23 ENCOUNTER — Telehealth: Payer: Self-pay

## 2021-06-23 DIAGNOSIS — E119 Type 2 diabetes mellitus without complications: Secondary | ICD-10-CM | POA: Diagnosis not present

## 2021-06-23 DIAGNOSIS — G8929 Other chronic pain: Secondary | ICD-10-CM | POA: Diagnosis not present

## 2021-06-23 DIAGNOSIS — I10 Essential (primary) hypertension: Secondary | ICD-10-CM | POA: Diagnosis not present

## 2021-06-23 DIAGNOSIS — M545 Low back pain, unspecified: Secondary | ICD-10-CM | POA: Diagnosis not present

## 2021-06-23 DIAGNOSIS — Z79899 Other long term (current) drug therapy: Secondary | ICD-10-CM | POA: Diagnosis not present

## 2021-06-23 DIAGNOSIS — Z6834 Body mass index (BMI) 34.0-34.9, adult: Secondary | ICD-10-CM | POA: Diagnosis not present

## 2021-06-23 NOTE — Telephone Encounter (Signed)
?  Follow up Call- ? ?Call back number 06/21/2021 01/01/2019  ?Post procedure Call Back phone  # [phone]  ?Permission to leave phone message No Yes  ?Some recent data might be hidden  ?  ? ?Patient questions: ? ?Do you have a fever, pain , or abdominal swelling? No. ?Pain Score  0 * ? ?Have you tolerated food without any problems? Yes.   ? ?Have you been able to return to your normal activities? Yes.   ? ?Do you have any questions about your discharge instructions: ?Diet   No. ?Medications  No. ?Follow up visit  No. ? ?Do you have questions or concerns about your Care? No. ? ?Actions: ?* If pain score is 4 or above: ?No action needed, pain <4. ? ?Have you developed a fever since your procedure? no ? ?2.   Have you had an respiratory symptoms (SOB or cough) since your procedure? no ? ?3.   Have you tested positive for COVID 19 since your procedure no ? ?4.   Have you had any family members/close contacts diagnosed with the COVID 19 since your procedure?  no ? ? ?If yes to any of these questions please route to Joylene John, RN and Joella Prince, RN ? ? ? ?

## 2021-06-27 ENCOUNTER — Encounter: Payer: Self-pay | Admitting: Internal Medicine

## 2021-08-04 DIAGNOSIS — R030 Elevated blood-pressure reading, without diagnosis of hypertension: Secondary | ICD-10-CM | POA: Diagnosis not present

## 2021-08-04 DIAGNOSIS — M545 Low back pain, unspecified: Secondary | ICD-10-CM | POA: Diagnosis not present

## 2021-08-04 DIAGNOSIS — Z79899 Other long term (current) drug therapy: Secondary | ICD-10-CM | POA: Diagnosis not present

## 2021-08-04 DIAGNOSIS — Z6834 Body mass index (BMI) 34.0-34.9, adult: Secondary | ICD-10-CM | POA: Diagnosis not present

## 2021-08-04 DIAGNOSIS — G8929 Other chronic pain: Secondary | ICD-10-CM | POA: Diagnosis not present

## 2021-08-10 DIAGNOSIS — M545 Low back pain, unspecified: Secondary | ICD-10-CM | POA: Diagnosis not present

## 2021-08-31 IMAGING — DX DG CHEST 1V PORT
1 series · 1 of 1 positions shown · non-contrast
Comparison: September 01, 2010

CLINICAL DATA: Cough

EXAM:
PORTABLE CHEST 1 VIEW

[chest ap]
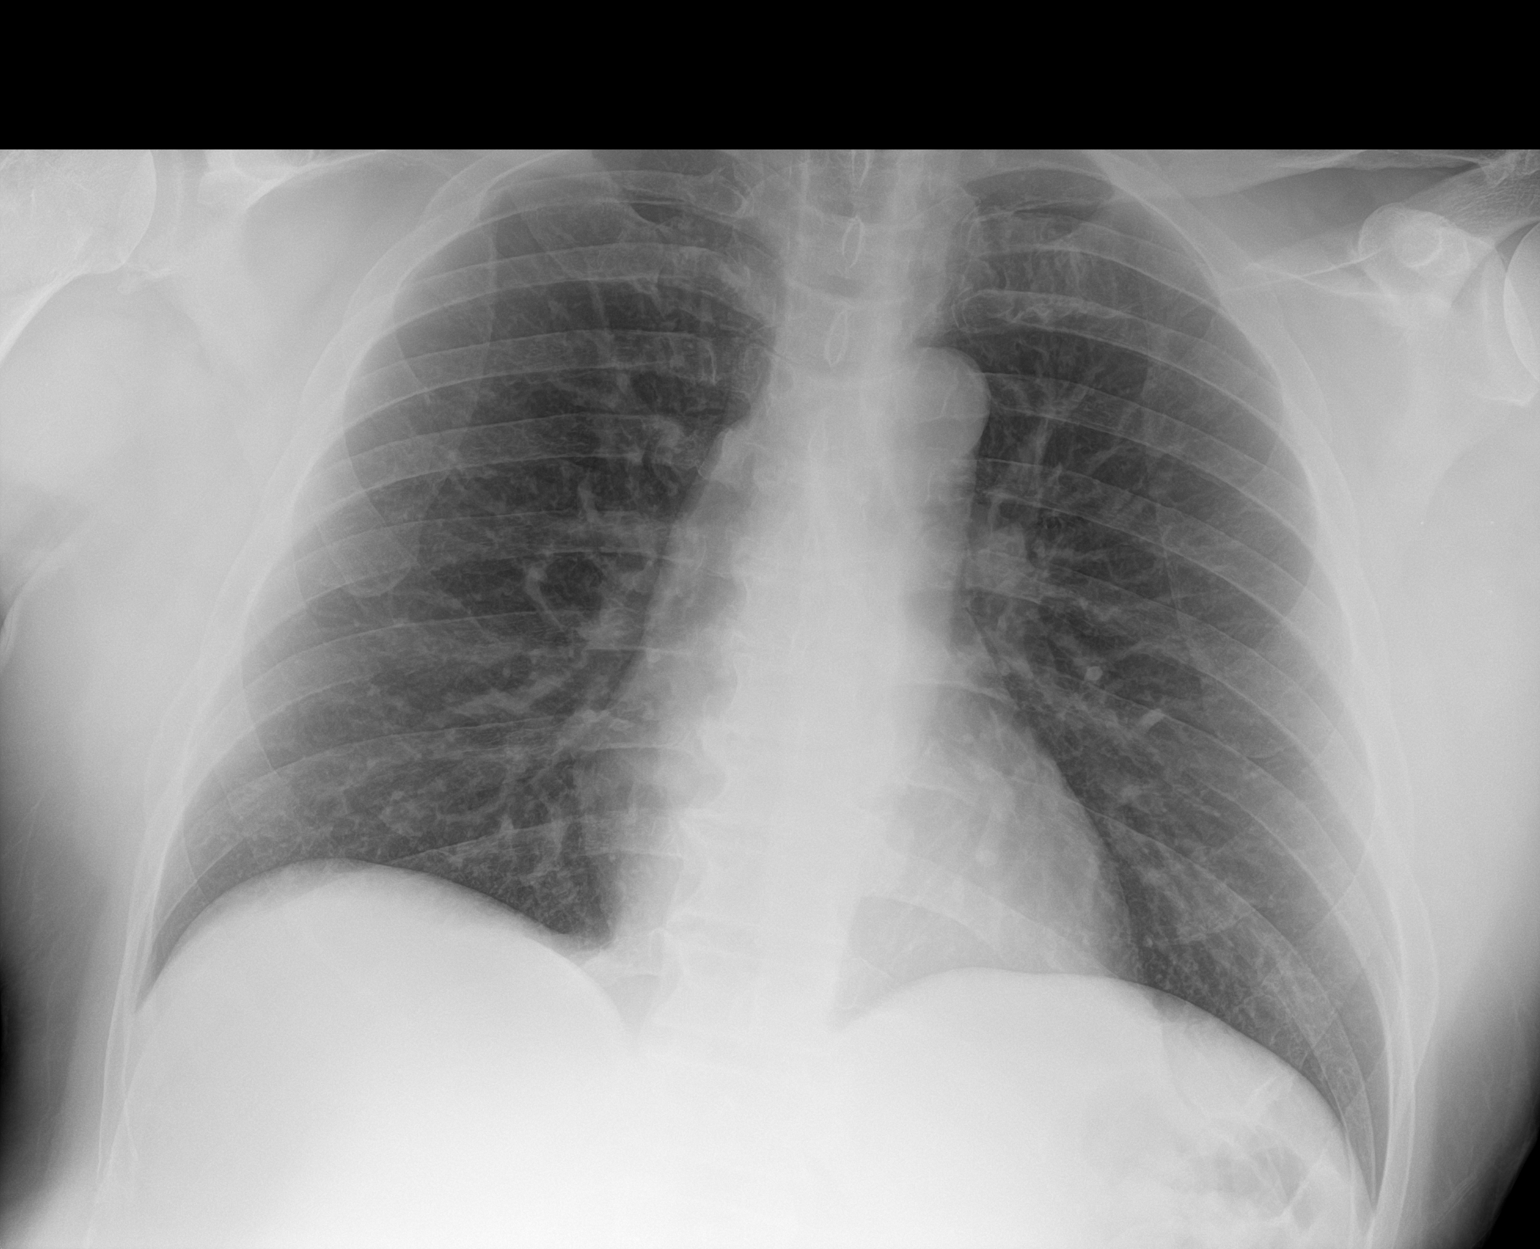

[1 of 1 positions shown; findings below may reference images not displayed]

FINDINGS: The heart size and mediastinal contours are within normal limits.
Both lungs are clear. The visualized skeletal structures are
unremarkable.
IMPRESSION: No active disease.

## 2021-09-13 ENCOUNTER — Encounter: Payer: Self-pay | Admitting: *Deleted

## 2021-09-22 DIAGNOSIS — G8929 Other chronic pain: Secondary | ICD-10-CM | POA: Diagnosis not present

## 2021-09-22 DIAGNOSIS — R030 Elevated blood-pressure reading, without diagnosis of hypertension: Secondary | ICD-10-CM | POA: Diagnosis not present

## 2021-09-22 DIAGNOSIS — M545 Low back pain, unspecified: Secondary | ICD-10-CM | POA: Diagnosis not present

## 2021-09-22 DIAGNOSIS — Z6834 Body mass index (BMI) 34.0-34.9, adult: Secondary | ICD-10-CM | POA: Diagnosis not present

## 2021-09-22 DIAGNOSIS — Z79899 Other long term (current) drug therapy: Secondary | ICD-10-CM | POA: Diagnosis not present

## 2021-09-22 DIAGNOSIS — M5136 Other intervertebral disc degeneration, lumbar region: Secondary | ICD-10-CM | POA: Diagnosis not present

## 2021-09-22 DIAGNOSIS — M5030 Other cervical disc degeneration, unspecified cervical region: Secondary | ICD-10-CM | POA: Diagnosis not present

## 2021-09-22 DIAGNOSIS — M542 Cervicalgia: Secondary | ICD-10-CM | POA: Diagnosis not present

## 2021-09-26 DIAGNOSIS — Z79899 Other long term (current) drug therapy: Secondary | ICD-10-CM | POA: Diagnosis not present

## 2021-10-10 ENCOUNTER — Other Ambulatory Visit: Payer: Self-pay | Admitting: Family Medicine

## 2021-10-10 DIAGNOSIS — I10 Essential (primary) hypertension: Secondary | ICD-10-CM

## 2021-10-14 ENCOUNTER — Telehealth: Payer: Self-pay

## 2021-10-14 NOTE — Telephone Encounter (Signed)
Patient's wife calls nurse line requesting order for home oxygen. Wife reports that patient has had issues with breathing since having COVID in 2020. Reports that yesterday evening O2 saturations dropped to 89. Patient used home oxygen tank from family member. He slept with 4 L [place] and saturations went back up to high 90's.   Wife reports that patient is out running errands at this time and was in no distress when he left. I scheduled patient for Monday morning for further home oxygen eval.  Provided with strict ED precautions.   Please see the following for insurance required documentation for home oxygen.   For insurance to pay for Oxygen, the following O2 sats?must?be documented?all at the same visit: Unless the patient has an O2 sat of 88%, you can stop there; if not, the following will need to be completed. You will also need a face-to-face visit within the last 30 days.   Patient Saturations on?room air?at rest?=        %   Patient Saturations on?room air?while ambulating?=    %   Patient Saturations on? liters of Oxygen while ambulating?=    %   Please briefly explain why the patient needs home oxygen   Medicare?requires long-standing diagnoses for Oxygen, such as COPD, just for bronchitis or pneumonia by itself will not be covered.   Forwarding to PCP and ATC provider who will see patient on Monday.  Talbot Grumbling, RN

## 2021-10-17 ENCOUNTER — Encounter: Payer: Self-pay | Admitting: Family Medicine

## 2021-10-17 ENCOUNTER — Ambulatory Visit (INDEPENDENT_AMBULATORY_CARE_PROVIDER_SITE_OTHER): Payer: Medicare HMO | Admitting: Student

## 2021-10-17 VITALS — BP 126/80 | HR 80 | Wt 250.0 lb

## 2021-10-17 DIAGNOSIS — H16009 Unspecified corneal ulcer, unspecified eye: Secondary | ICD-10-CM | POA: Insufficient documentation

## 2021-10-17 DIAGNOSIS — R2243 Localized swelling, mass and lump, lower limb, bilateral: Secondary | ICD-10-CM | POA: Diagnosis not present

## 2021-10-17 DIAGNOSIS — R2241 Localized swelling, mass and lump, right lower limb: Secondary | ICD-10-CM | POA: Insufficient documentation

## 2021-10-17 DIAGNOSIS — R0602 Shortness of breath: Secondary | ICD-10-CM

## 2021-10-17 NOTE — Progress Notes (Signed)
SUBJECTIVE:   CHIEF COMPLAINT / HPI:   Shortness of breath Pt states he gets SOB with walking short distances such as walking from parking lot into a store. Feels like he cannot breath well through his nose, breaths a little better through mouth but still feels SOB. He just feels like he isn't getting enough air in general. Also feels light headed almost all the time, not like he is about to pass out but just uncomfortable and like he is spinning. This has been going on for the past 3-4 weeks and he states his wife feels the same way.  It does not matter where he is, he can feel this way at home, here in the doctor's office, or at the store.  He denies vomiting, nausea, diarrhea, chest pain, headaches, loss of appetite. He has an oxygen tank at home from when he had COVID and used it for 2 nights while sleeping and once during the day for about 40 minutes each time and states it made him feel better. He is unsure how many L of O2 he used.   Of note, patient was seen by Dr. Valentina Lucks on 05/24/2020 for pulmonary function tests which showed "excellent ability to move air quickly " and reduced FVC which Dr. Valentina Lucks noted may be related to deconditioning.  Stopped smoking cigarettes about 5 years ago. Started smoking at age 61, one pack would last 3 days and he smokes on and off over the years.   Knots on legs Pt states he has two knots, one on left calf and one on right calf. One on the right has been present for past couple months, has increased in size over time and is intermittently painful. The left one has been present for past week and is not painful.  He admits to night sweats since end of 2020, unsure if he has had fevers but wife states he feels warm sometimes.  PERTINENT  PMH / PSH: Psoriasis, chronic pain, anxiety  OBJECTIVE:   BP 126/80   Pulse 80   Wt 250 lb (113.4 kg)   SpO2 97% Comment: 95% with ambulating  BMI 34.87 kg/m    General: NAD, pleasant, able to participate in  exam Cardiac: RRR, no murmurs. Respiratory: CTAB, normal effort, No wheezes, rales or rhonchi Lymph nodes: Swollen lymph node in right axilla approximately 1 cm x 1 cm, no cervical, supraclavicular, submandibular, or groin lymphadenopathy Abdomen: Bowel sounds present, nontender, nondistended, no hepatosplenomegaly. Extremities: Firm nodule approximately 3 x 3 cm on the lateral right calf muscle nontender to palpation, no warmth, but with mild erythema.  Firm nodule approximately 1 x 1 cm on posterior left calf with no warmth or erythema and nontender to palpation Skin: warm and dry, no rashes noted Neuro: alert, no obvious focal deficits Psych: Normal affect and mood  ASSESSMENT/PLAN:   Shortness of breath Reassuringly, lung sounds clear to auscultation, patient is breathing very comfortably on exam, and oxygen saturation is mid to high 90s with both rest and ambulation.  We will check a BNP as shortness of breath is worse with exertion to rule out heart failure as a cause.  We will also check other labs which are noted below to see if shortness of breath could potentially be related to an autoimmune disorder such as sarcoidosis.  We will get a chest x-ray today.  We will follow-up pending results. -BNP -Chest x-ray   Nodule of skin of both lower legs This could potentially be erythema  nodosum syndrome although that is more likely seen on the shins.  We will check for causes of erythema nodosum including strep, autoimmune disorders.  As patient is also having night sweats and swollen lymph node in right axilla, will also check CBC for evidence of malignancy. -ANA -ASO titer -TSH -Sed rate -CBC -CMP  Dr. Precious Gilding, [place]

## 2021-10-17 NOTE — Patient Instructions (Signed)
It was great to see you! Thank you for allowing me to participate in your care!  I recommend that you always bring your medications to each appointment as this makes it easy to ensure you are on the correct medications and helps [place] not miss when refills are needed.  Our plans for today:  - If your shortness of breath worsens, your lips turn blue, you are having to work very hard to breath, you have chest pain, you feel like you may pass out, please seek emergency medical care  -We are checking some labs today, I will call you if they are abnormal will send you a MyChart message or a letter if they are normal.  If you do not hear about your labs in the next 2 weeks please let us know.  Take care and seek immediate care sooner if you develop any concerns.   Dr. Precious Gilding, DO [facility]

## 2021-10-17 NOTE — Assessment & Plan Note (Addendum)
Reassuringly, lung sounds clear to auscultation, patient is breathing very comfortably on exam, and oxygen saturation is mid to high 90s with both rest and ambulation.  We will check a BNP as shortness of breath is worse with exertion to rule out heart failure as a cause.  We will also check other labs which are noted below to see if shortness of breath could potentially be related to an autoimmune disorder such as sarcoidosis.  We will get a chest x-ray today.  We will follow-up pending results.

## 2021-10-18 LAB — TSH: TSH: 1.23 u[IU]/mL (ref 0.450–4.500)

## 2021-10-18 LAB — COMPREHENSIVE METABOLIC PANEL
ALT: 19 IU/L (ref 0–44)
AST: 15 IU/L (ref 0–40)
Albumin/Globulin Ratio: 1.7 (ref 1.2–2.2)
Albumin: 4.5 g/dL (ref 3.8–4.9)
Alkaline Phosphatase: 89 IU/L (ref 44–121)
BUN/Creatinine Ratio: 12 (ref 10–24)
BUN: 10 mg/dL (ref 8–27)
Bilirubin Total: 0.6 mg/dL (ref 0.0–1.2)
CO2: 22 mmol/L (ref 20–29)
Calcium: 9.8 mg/dL (ref 8.6–10.2)
Chloride: 101 mmol/L (ref 96–106)
Creatinine, Ser: 0.82 mg/dL (ref 0.76–1.27)
Globulin, Total: 2.7 g/dL (ref 1.5–4.5)
Glucose: 215 mg/dL — ABNORMAL HIGH (ref 70–99)
Potassium: 4.2 mmol/L (ref 3.5–5.2)
Sodium: 136 mmol/L (ref 134–144)
Total Protein: 7.2 g/dL (ref 6.0–8.5)
eGFR: 101 mL/min/{1.73_m2} (ref >59)

## 2021-10-18 LAB — CBC
Hematocrit: 44.1 % (ref 37.5–51.0)
Hemoglobin: 15.2 g/dL (ref 13.0–17.7)
MCH: 28.7 pg (ref 26.6–33.0)
MCHC: 34.5 g/dL (ref 31.5–35.7)
MCV: 83 fL (ref 79–97)
Platelets: 219 10*3/uL (ref 150–450)
RBC: 5.3 x10E6/uL (ref 4.14–5.80)
RDW: 13.7 % (ref 11.6–15.4)
WBC: 5.5 10*3/uL (ref 3.4–10.8)

## 2021-10-18 LAB — ANA: Anti Nuclear Antibody (ANA): NEGATIVE

## 2021-10-18 LAB — SEDIMENTATION RATE: Sed Rate: 19 mm/hr (ref 0–30)

## 2021-10-18 LAB — ANTISTREPTOLYSIN O TITER: ASO: 33 IU/mL (ref 0.0–200.0)

## 2021-10-18 LAB — BRAIN NATRIURETIC PEPTIDE: BNP: 58.9 pg/mL (ref 0.0–100.0)

## 2021-10-25 ENCOUNTER — Other Ambulatory Visit: Payer: Self-pay | Admitting: Student

## 2021-10-25 DIAGNOSIS — R2243 Localized swelling, mass and lump, lower limb, bilateral: Secondary | ICD-10-CM

## 2021-10-25 NOTE — Progress Notes (Signed)
Ultrasound of nodule on Rt lower extremity ordered. Pt notified via phone call and in agreement.

## 2021-10-27 ENCOUNTER — Encounter: Payer: Medicare HMO | Attending: Physical Medicine and Rehabilitation | Admitting: Physical Medicine and Rehabilitation

## 2021-10-28 ENCOUNTER — Telehealth: Payer: Self-pay | Admitting: Family Medicine

## 2021-10-28 ENCOUNTER — Telehealth: Payer: Self-pay

## 2021-10-28 ENCOUNTER — Inpatient Hospital Stay: Admission: RE | Admit: 2021-10-28 | Payer: Medicare HMO | Source: Ambulatory Visit

## 2021-10-28 NOTE — Telephone Encounter (Signed)
Patient's wife came in stating that patient missed his appt with [facility] and was told he couldn't reschedule. Wants to know if he can get referred again.

## 2021-10-28 NOTE — Telephone Encounter (Signed)
Received phone call from [place] regarding [place] order for lower extremity.   Ultrasound was ordered for RLE, however, diagnosis code was for nodules on both legs.   If ultrasound is needed for both legs, please place new order.   If only RLE is needed, please let me know and I can call back [place] with clarification.   Talbot Grumbling, RN

## 2021-11-01 NOTE — Telephone Encounter (Signed)
Called [facility] and informed. They informed that diagnosis code would need to be changed to right leg nodule.   Thanks.   Talbot Grumbling, RN

## 2021-11-02 ENCOUNTER — Encounter: Payer: Self-pay | Admitting: Student

## 2021-11-02 ENCOUNTER — Other Ambulatory Visit: Payer: Self-pay | Admitting: Student

## 2021-11-02 DIAGNOSIS — R2241 Localized swelling, mass and lump, right lower limb: Secondary | ICD-10-CM

## 2021-11-07 ENCOUNTER — Ambulatory Visit
Admission: RE | Admit: 2021-11-07 | Discharge: 2021-11-07 | Disposition: A | Discharge status: Home / Self Care | Payer: Medicare HMO | Source: Ambulatory Visit | Attending: Family Medicine | Admitting: Family Medicine

## 2021-11-07 DIAGNOSIS — R2241 Localized swelling, mass and lump, right lower limb: Secondary | ICD-10-CM

## 2021-11-07 DIAGNOSIS — R600 Localized edema: Secondary | ICD-10-CM | POA: Diagnosis not present

## 2021-11-08 ENCOUNTER — Telehealth: Payer: Self-pay | Admitting: Student

## 2021-11-08 NOTE — Telephone Encounter (Signed)
Called pt to discuss results of recent US of nodule on R leg. US showed no mass or fluid collection. Pt states nodule is still present but seems to be 'splitting' into 2-3 parts. Is still a little painful. Nodule on L leg is now becoming painful.  I explained to pt that next best step is to get scheduled for a biopsy with our dermatology clinic. Pt is agreeable with this plan.

## 2021-11-09 NOTE — Telephone Encounter (Signed)
Looks like he got the U/S done. Austyn Perriello Kennon Holter, CMA

## 2021-11-16 DIAGNOSIS — E119 Type 2 diabetes mellitus without complications: Secondary | ICD-10-CM | POA: Diagnosis not present

## 2021-11-16 DIAGNOSIS — G8929 Other chronic pain: Secondary | ICD-10-CM | POA: Diagnosis not present

## 2021-11-16 DIAGNOSIS — B182 Chronic viral hepatitis C: Secondary | ICD-10-CM | POA: Diagnosis not present

## 2021-11-16 DIAGNOSIS — Z79899 Other long term (current) drug therapy: Secondary | ICD-10-CM | POA: Diagnosis not present

## 2021-11-16 DIAGNOSIS — R030 Elevated blood-pressure reading, without diagnosis of hypertension: Secondary | ICD-10-CM | POA: Diagnosis not present

## 2021-11-16 DIAGNOSIS — K7460 Unspecified cirrhosis of liver: Secondary | ICD-10-CM | POA: Diagnosis not present

## 2021-11-16 DIAGNOSIS — M545 Low back pain, unspecified: Secondary | ICD-10-CM | POA: Diagnosis not present

## 2021-11-23 DIAGNOSIS — Z0100 Encounter for examination of eyes and vision without abnormal findings: Secondary | ICD-10-CM | POA: Diagnosis not present

## 2021-11-28 DIAGNOSIS — H0102B Squamous blepharitis left eye, upper and lower eyelids: Secondary | ICD-10-CM | POA: Diagnosis not present

## 2021-11-28 DIAGNOSIS — H179 Unspecified corneal scar and opacity: Secondary | ICD-10-CM | POA: Diagnosis not present

## 2021-11-28 DIAGNOSIS — H16423 Pannus (corneal), bilateral: Secondary | ICD-10-CM | POA: Diagnosis not present

## 2021-11-28 DIAGNOSIS — H40013 Open angle with borderline findings, low risk, bilateral: Secondary | ICD-10-CM | POA: Diagnosis not present

## 2021-11-28 DIAGNOSIS — E119 Type 2 diabetes mellitus without complications: Secondary | ICD-10-CM | POA: Diagnosis not present

## 2021-11-28 DIAGNOSIS — H0102A Squamous blepharitis right eye, upper and lower eyelids: Secondary | ICD-10-CM | POA: Diagnosis not present

## 2021-11-28 DIAGNOSIS — H2513 Age-related nuclear cataract, bilateral: Secondary | ICD-10-CM | POA: Diagnosis not present
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
## 2021-12-01 ENCOUNTER — Ambulatory Visit: Payer: Medicare HMO

## 2021-12-03 ENCOUNTER — Other Ambulatory Visit: Payer: Self-pay | Admitting: Family Medicine

## 2021-12-03 DIAGNOSIS — I10 Essential (primary) hypertension: Secondary | ICD-10-CM

## 2021-12-14 ENCOUNTER — Ambulatory Visit: Payer: Medicare HMO | Attending: Rheumatology | Admitting: Rheumatology

## 2021-12-14 ENCOUNTER — Encounter: Payer: Self-pay | Admitting: Rheumatology

## 2021-12-14 VITALS — BP 142/95 | HR 75 | Resp 18 | Ht 71.0 in | Wt 248.2 lb

## 2021-12-14 DIAGNOSIS — M5030 Other cervical disc degeneration, unspecified cervical region: Secondary | ICD-10-CM | POA: Diagnosis not present

## 2021-12-14 DIAGNOSIS — R7989 Other specified abnormal findings of blood chemistry: Secondary | ICD-10-CM

## 2021-12-14 DIAGNOSIS — M170 Bilateral primary osteoarthritis of knee: Secondary | ICD-10-CM

## 2021-12-14 DIAGNOSIS — M160 Bilateral primary osteoarthritis of hip: Secondary | ICD-10-CM | POA: Diagnosis not present

## 2021-12-14 DIAGNOSIS — F1911 Other psychoactive substance abuse, in remission: Secondary | ICD-10-CM

## 2021-12-14 DIAGNOSIS — M19041 Primary osteoarthritis, right hand: Secondary | ICD-10-CM | POA: Diagnosis not present

## 2021-12-14 DIAGNOSIS — I10 Essential (primary) hypertension: Secondary | ICD-10-CM

## 2021-12-14 DIAGNOSIS — L409 Psoriasis, unspecified: Secondary | ICD-10-CM | POA: Diagnosis not present

## 2021-12-14 DIAGNOSIS — H16009 Unspecified corneal ulcer, unspecified eye: Secondary | ICD-10-CM

## 2021-12-14 DIAGNOSIS — M51369 Other intervertebral disc degeneration, lumbar region without mention of lumbar back pain or lower extremity pain: Secondary | ICD-10-CM

## 2021-12-14 DIAGNOSIS — F1011 Alcohol abuse, in remission: Secondary | ICD-10-CM

## 2021-12-14 DIAGNOSIS — Z800 Family history of malignant neoplasm of digestive organs: Secondary | ICD-10-CM

## 2021-12-14 DIAGNOSIS — M19042 Primary osteoarthritis, left hand: Secondary | ICD-10-CM

## 2021-12-14 DIAGNOSIS — Z8639 Personal history of other endocrine, nutritional and metabolic disease: Secondary | ICD-10-CM

## 2021-12-14 DIAGNOSIS — M5136 Other intervertebral disc degeneration, lumbar region: Secondary | ICD-10-CM | POA: Diagnosis not present

## 2021-12-14 DIAGNOSIS — F4321 Adjustment disorder with depressed mood: Secondary | ICD-10-CM

## 2021-12-14 DIAGNOSIS — B1920 Unspecified viral hepatitis C without hepatic coma: Secondary | ICD-10-CM

## 2021-12-14 DIAGNOSIS — Z860100 Personal history of colon polyps, unspecified: Secondary | ICD-10-CM

## 2021-12-14 DIAGNOSIS — E119 Type 2 diabetes mellitus without complications: Secondary | ICD-10-CM

## 2021-12-14 DIAGNOSIS — Z86718 Personal history of other venous thrombosis and embolism: Secondary | ICD-10-CM

## 2021-12-14 DIAGNOSIS — G4709 Other insomnia: Secondary | ICD-10-CM

## 2021-12-14 NOTE — Patient Instructions (Signed)
Hand Exercises Hand exercises can be helpful for almost anyone. These exercises can strengthen the hands, improve flexibility and movement, and increase blood flow to the hands. These results can make work and daily tasks easier. Hand exercises can be especially helpful for people who have joint pain from arthritis or have nerve damage from overuse (carpal tunnel syndrome). These exercises can also help people who have injured a hand. Exercises Most of these hand exercises are gentle stretching and motion exercises. It is usually safe to do them often throughout the day. Warming up your hands before exercise may help to reduce stiffness. You can do this with gentle massage or by placing your hands in warm water for 10-15 minutes. It is normal to feel some stretching, pulling, tightness, or mild discomfort as you begin new exercises. This will gradually improve. Stop an exercise right away if you feel sudden, severe pain or your pain gets worse. Ask your health care provider which exercises are best for you. Knuckle bend or "claw" fist  Stand or sit with your arm, hand, and all five fingers pointed straight up. Make sure to keep your wrist straight during the exercise. Gently bend your fingers down toward your palm until the tips of your fingers are touching the top of your palm. Keep your big knuckle straight and just bend the small knuckles in your fingers. Hold this position for __________ seconds. Straighten (extend) your fingers back to the starting position. Repeat this exercise 5-10 times with each hand. Full finger fist  Stand or sit with your arm, hand, and all five fingers pointed straight up. Make sure to keep your wrist straight during the exercise. Gently bend your fingers into your palm until the tips of your fingers are touching the middle of your palm. Hold this position for __________ seconds. Extend your fingers back to the starting position, stretching every joint fully. Repeat  this exercise 5-10 times with each hand. Straight fist Stand or sit with your arm, hand, and all five fingers pointed straight up. Make sure to keep your wrist straight during the exercise. Gently bend your fingers at the big knuckle, where your fingers meet your hand, and the middle knuckle. Keep the knuckle at the tips of your fingers straight and try to touch the bottom of your palm. Hold this position for __________ seconds. Extend your fingers back to the starting position, stretching every joint fully. Repeat this exercise 5-10 times with each hand. Tabletop  Stand or sit with your arm, hand, and all five fingers pointed straight up. Make sure to keep your wrist straight during the exercise. Gently bend your fingers at the big knuckle, where your fingers meet your hand, as far down as you can while keeping the small knuckles in your fingers straight. Think of forming a tabletop with your fingers. Hold this position for __________ seconds. Extend your fingers back to the starting position, stretching every joint fully. Repeat this exercise 5-10 times with each hand. Finger spread  Place your hand flat on a table with your palm facing down. Make sure your wrist stays straight as you do this exercise. Spread your fingers and thumb apart from each other as far as you can until you feel a gentle stretch. Hold this position for __________ seconds. Bring your fingers and thumb tight together again. Hold this position for __________ seconds. Repeat this exercise 5-10 times with each hand. Making circles  Stand or sit with your arm, hand, and all five fingers pointed   straight up. Make sure to keep your wrist straight during the exercise. Make a circle by touching the tip of your thumb to the tip of your index finger. Hold for __________ seconds. Then open your hand wide. Repeat this motion with your thumb and each finger on your hand. Repeat this exercise 5-10 times with each hand. Thumb  motion  Sit with your forearm resting on a table and your wrist straight. Your thumb should be facing up toward the ceiling. Keep your fingers relaxed as you move your thumb. Lift your thumb up as high as you can toward the ceiling. Hold for __________ seconds. Bend your thumb across your palm as far as you can, reaching the tip of your thumb for the small finger (pinkie) side of your palm. Hold for __________ seconds. Repeat this exercise 5-10 times with each hand. Grip strengthening  Hold a stress ball or other soft ball in the middle of your hand. Slowly increase the pressure, squeezing the ball as much as you can without causing pain. Think of bringing the tips of your fingers into the middle of your palm. All of your finger joints should bend when doing this exercise. Hold your squeeze for __________ seconds, then relax. Repeat this exercise 5-10 times with each hand. Contact a health care provider if: Your hand pain or discomfort gets much worse when you do an exercise. Your hand pain or discomfort does not improve within 2 hours after you exercise. If you have any of these problems, stop doing these exercises right away. Do not do them again unless your health care provider says that you can. Get help right away if: You develop sudden, severe hand pain or swelling. If this happens, stop doing these exercises right away. Do not do them again unless your health care provider says that you can. This information is not intended to replace advice given to you by your health care provider. Make sure you discuss any questions you have with your health care provider. Document Revised: 07/15/2020 Document Reviewed: 07/15/2020 Elsevier Patient Education  2023 [employer] Exercises for Chronic Knee Pain Chronic knee pain is pain that lasts longer than 3 months. For most people with chronic knee pain, exercise and weight loss is an important part of treatment. Your health care provider may want  you to focus on: Strengthening the muscles that support your knee. This can take pressure off your knee and lessen pain. Preventing knee stiffness. Maintaining or increasing how far you can move your knee. Losing weight (if this applies) to take pressure off your knee, decrease your risk for injury, and make it easier for you to exercise. Your health care provider will help you develop an exercise program that matches your needs and physical abilities. Below are simple, low-impact exercises you can do at home. Ask your health care provider or a physical therapist how often you should do your exercise program and how many times to repeat each exercise. General safety tips Follow these safety tips for exercising with chronic knee pain: Get your health care provider's approval before doing any exercises. Start slowly and stop any time an exercise causes pain. Do not exercise if your knee pain is flaring up. Warm up first. Stretching a cold muscle can cause an injury. Do 5-10 minutes of easy movement or light stretching before beginning your exercise routine. Do 5-10 minutes of low-impact activity (like walking or cycling) before starting strengthening exercises. Contact your health care provider any time you have pain   during or after exercising. Exercise may cause discomfort but should not be painful. It is normal to be a little stiff or sore after exercising.  Stretching and range-of-motion exercises Front thigh stretch  Stand up straight and support your body by holding on to a chair or resting one hand on a wall. With your legs straight and close together, bend one knee to lift your heel up toward your buttocks. Using one hand for support, grab your ankle with your free hand. Pull your foot up closer toward your buttocks to feel the stretch in front of your thigh. Hold the stretch for 30 seconds. Repeat __________ times. Complete this exercise __________ times a day. Back thigh stretch  Sit  on the floor with your back straight and your legs out straight in front of you. Place the palms of your hands on the floor and slide them toward your feet as you bend at the hip. Try to touch your nose to your knees and feel the stretch in the back of your thighs. Hold for 30 seconds. Repeat __________ times. Complete this exercise __________ times a day. Calf stretch  Stand facing a wall. Place the palms of your hands flat against the wall, arms extended, and lean slightly against the wall. Get into a lunge position with one leg bent at the knee and the other leg stretched out straight behind you. Keep both feet facing the wall and increase the bend in your knee while keeping the heel of the other leg flat on the ground. You should feel the stretch in your calf. Hold for 30 seconds. Repeat __________ times. Complete this exercise __________ times a day. Strengthening exercises Straight leg lift Lie on your back with one knee bent and the other leg out straight. Slowly lift the straight leg without bending the knee. Lift until your foot is about 12 inches (30 cm) off the floor. Hold for 3-5 seconds and slowly lower your leg. Repeat __________ times. Complete this exercise __________ times a day. Single leg dip Stand between two chairs and put both hands on the backs of the chairs for support. Extend one leg out straight with your body weight resting on the heel of the standing leg. Slowly bend your standing knee to dip your body to the level that is comfortable for you. Hold for 3-5 seconds. Repeat __________ times. Complete this exercise __________ times a day. Hamstring curls Stand straight, knees close together, facing the back of a chair. Hold on to the back of a chair with both hands. Keep one leg straight. Bend the other knee while bringing the heel up toward the buttock until the knee is bent at a 90-degree angle (right angle). Hold for 3-5 seconds. Repeat __________ times.  Complete this exercise __________ times a day. Wall squat Stand straight with your back, hips, and head against a wall. Step forward one foot at a time with your back still against the wall. Your feet should be 2 feet (61 cm) from the wall at shoulder width. Keeping your back, hips, and head against the wall, slide down the wall to as close of a sitting position as you can get. Hold for 5-10 seconds, then slowly slide back up. Repeat __________ times. Complete this exercise __________ times a day. Step-ups Step up with one foot onto a sturdy platform or stool that is about 6 inches (15 cm) high. Face sideways with one foot on the platform and one on the ground. Place all your weight on  on the platform foot and lift your body off the ground until your knee extends. Let your other leg hang free to the side. Hold for 3-5 seconds then slowly lower your weight down to the floor foot. Repeat __________ times. Complete this exercise __________ times a day. Contact a health care provider if: Your exercise causes pain. Your pain is worse after you exercise. Your pain prevents you from doing your exercises. This information is not intended to replace advice given to you by your health care provider. Make sure you discuss any questions you have with your health care provider. Document Revised: 07/31/2019 Document Reviewed: 03/24/2019 Elsevier Patient Education  2023 Elsevier Inc.  

## 2021-12-27 DIAGNOSIS — R030 Elevated blood-pressure reading, without diagnosis of hypertension: Secondary | ICD-10-CM | POA: Diagnosis not present

## 2021-12-27 DIAGNOSIS — G8929 Other chronic pain: Secondary | ICD-10-CM | POA: Diagnosis not present

## 2021-12-27 DIAGNOSIS — B182 Chronic viral hepatitis C: Secondary | ICD-10-CM | POA: Diagnosis not present

## 2021-12-27 DIAGNOSIS — E119 Type 2 diabetes mellitus without complications: Secondary | ICD-10-CM | POA: Diagnosis not present

## 2021-12-27 DIAGNOSIS — M545 Low back pain, unspecified: Secondary | ICD-10-CM | POA: Diagnosis not present

## 2021-12-27 DIAGNOSIS — K7460 Unspecified cirrhosis of liver: Secondary | ICD-10-CM | POA: Diagnosis not present

## 2021-12-27 DIAGNOSIS — Z79899 Other long term (current) drug therapy: Secondary | ICD-10-CM | POA: Diagnosis not present

## 2021-12-29 ENCOUNTER — Other Ambulatory Visit: Payer: Self-pay | Admitting: Family Medicine

## 2021-12-29 DIAGNOSIS — I10 Essential (primary) hypertension: Secondary | ICD-10-CM

## 2021-12-29 DIAGNOSIS — Z79899 Other long term (current) drug therapy: Secondary | ICD-10-CM | POA: Diagnosis not present

## 2022-01-04 ENCOUNTER — Telehealth: Payer: Self-pay

## 2022-01-04 NOTE — Patient Outreach (Signed)
  Care Coordination   Initial Visit Note   01/04/2022 Name: JASIN BRAZEL MRN: [id number] DOB: [birth year]  Corley EFTON THOMLEY is a 61 y.o. year old male who sees Shary Key, DO for primary care. I spoke with  Tye Savoy by phone today.  What matters to the patients health and wellness today?  Patient states he has been having ongoing leg issues for past several months. He reports he has some nodules on leg and painful at times. Possible referral for biopsy was mentioned but patient has not followed up regarding the matter. Encouraged to do so ASAP. He states that his blood sugars have also been running a little higher(mid 100's) than normal for him.     Goals Addressed             This Visit's Progress    Find out what's going on with my legs        Care Coordination Interventions: Advised patient to contact MD office ASAP to follow up on leg pain,nodule and biopsy needed Provided education to patient re: [facility] services Assessed social determinant of health barriers         SDOH assessments and interventions completed:  Yes  SDOH Interventions Today    Flowsheet Row Most Recent Value  SDOH Interventions   Food Insecurity Interventions Intervention Not Indicated  Transportation Interventions Intervention Not Indicated        Care Coordination Interventions Activated:  Yes  Care Coordination Interventions:  Yes, provided   Follow up plan: Follow up call scheduled for 01/18/22-10:30 am with Rosalee Kaufman    Encounter Outcome:  Pt. Visit Completed    Enzo Montgomery, RN,BSN,CCM [facility] Telephonic Care Management Coordinator Direct Phone: [phone] Toll Free: [phone] Fax: [phone]

## 2022-01-04 NOTE — Patient Instructions (Signed)
Visit Information  Thank you for taking time to visit with me today. Please don't hesitate to contact me if I can be of assistance to you.   Following are the goals we discussed today:   Goals Addressed             This Visit's Progress    Find out what's going on with my legs        Care Coordination Interventions: Advised patient to contact MD office ASAP to follow up on leg pain,nodule and biopsy needed Provided education to patient re: [place] social determinant of health barriers         Your next appointment is by telephone on 01/18/22 at 10:30am  Please call the care guide team at [phone] if you need to cancel or reschedule your appointment.   If you are experiencing a Mental Health or [place] or need someone to talk to, please call the [place] National Suicide Prevention Lifeline: 207-555-3051 or TTY: (863)863-6425 TTY 314-064-9572) to talk to a trained counselor  Patient verbalizes understanding of instructions and care plan provided today and agrees to view in [place]. Active MyChart status and patient understanding of how to access instructions and care plan via MyChart confirmed with patient.

## 2022-01-09 ENCOUNTER — Telehealth: Payer: Self-pay

## 2022-01-09 NOTE — Telephone Encounter (Signed)
Patient calls nurse line requesting to schedule follow up appointment for leg nodules and umbilical hernia. He reports that he feels that hernia has grown since last appointment. Denies pain, vomiting, or discoloration.   Scheduled patient with PCP on Thursday at 2:10. Red flags discussed.   Talbot Grumbling, RN

## 2022-01-12 ENCOUNTER — Ambulatory Visit: Payer: Medicare HMO | Admitting: Family Medicine

## 2022-01-13 ENCOUNTER — Ambulatory Visit: Payer: Medicare HMO | Admitting: Student

## 2022-01-16 ENCOUNTER — Ambulatory Visit (INDEPENDENT_AMBULATORY_CARE_PROVIDER_SITE_OTHER): Payer: Medicare HMO | Admitting: Family Medicine

## 2022-01-16 VITALS — BP 128/78 | HR 97 | Wt 248.0 lb

## 2022-01-16 DIAGNOSIS — R2243 Localized swelling, mass and lump, lower limb, bilateral: Secondary | ICD-10-CM

## 2022-01-16 DIAGNOSIS — K429 Umbilical hernia without obstruction or gangrene: Secondary | ICD-10-CM | POA: Diagnosis not present

## 2022-01-16 NOTE — Assessment & Plan Note (Signed)
-  seems benign although would favor general surgery evaluation to ensure no surgical intervention needed, surgery referral placed -return and ED precautions discussed

## 2022-01-16 NOTE — Assessment & Plan Note (Signed)
-  low to no suspicion of DVT given symptoms, these seem to be benign skin changes. Recent US performed showed no mass or prominent fluid collection  -continue to monitor for changes, reassurance provided  -ED precautions discussed

## 2022-01-16 NOTE — Patient Instructions (Signed)
It was great seeing you today!  Today we discussed your leg nodules, this seems benign and an ultrasound was done for the other leg which is similar. If you notice any redness, leg or calf tenderness or shortness of breath then please go to the emergency department.   Regarding your umbilical hernia, just to be sure I want you to get evaluated by surgery to ensure there is nothing else to do other than monitor.   Please follow up at your next scheduled appointment, if anything arises between now and then, please don't hesitate to contact our office.   Thank you for allowing [place] to be a part of your medical care!  Thank you, Dr. Larae Grooms

## 2022-01-16 NOTE — Progress Notes (Signed)
    SUBJECTIVE:   CHIEF COMPLAINT / HPI:   Patient presents with nodule on his left lower extremity, has had one on the right previously. He noticed earlier in the year which formed before the right one, he has been evaluated for the right one but would like to get the other one looked at as well. Denies calf tenderness, the nodule is starting to get painful. Denies erythema but reports mild edema along the nodule. Started out as the size of a nickel and now is slightly larger. Denies chest pain and dyspnea.   He also wants to discuss his hernia, history of umbilical hernia of 0737. Some days it aches and other days he does not even notice it. The occasional aching earlier this summer. Denies nausea, vomiting and pain otherwise.   OBJECTIVE:   BP 128/78   Pulse 97   Wt 248 lb (112.5 kg)   SpO2 98%   BMI 34.59 kg/m   General: Patient well-appearing, in no acute distress. CV: RRR, no murmurs or gallops auscultated Resp: CTAB, no wheezing, rales or rhonchi noted Abdomen: soft, nontender, nondistended, presence of bowel sounds, presence of umbilical hernia without signs of incarceration or color change Ext: subcutaneous nodule measuring 1-2 cm in diameter on both LE with mild tenderness on palpation without erythema, no LE edema noted, no calf tenderness bilaterally  ASSESSMENT/PLAN:   Umbilical hernia -seems benign although would favor general surgery evaluation to ensure no surgical intervention needed, surgery referral placed -return and ED precautions discussed  Nodule of skin of both lower extremities -low to no suspicion of DVT given symptoms, these seem to be benign skin changes. Recent US performed showed no mass or prominent fluid collection  -continue to monitor for changes, reassurance provided  -ED precautions discussed    -PHQ-9 score of 1 with negative question 9 reviewed.   Donney Dice, [place]

## 2022-01-18 ENCOUNTER — Ambulatory Visit: Payer: Self-pay

## 2022-01-18 NOTE — Patient Outreach (Signed)
  Care Coordination   01/18/2022 Name: Cameron Diaz MRN: [id number] DOB: 02-Aug-1960   Care Coordination Outreach Attempts:  An unsuccessful telephone outreach was attempted today to offer the patient information about available care coordination services as a benefit of their health plan.   Follow Up Plan:  Additional outreach attempts will be made to offer the patient care coordination information and services.   Encounter Outcome:  No Answer  Care Coordination Interventions Activated:  No   Care Coordination Interventions:  No, not indicated    Lazaro Arms RN, BSN, [facility]   Phone: [phone]

## 2022-01-25 ENCOUNTER — Ambulatory Visit: Payer: Medicare HMO | Admitting: Family Medicine

## 2022-01-31 DIAGNOSIS — N529 Male erectile dysfunction, unspecified: Secondary | ICD-10-CM | POA: Diagnosis not present

## 2022-01-31 DIAGNOSIS — G8929 Other chronic pain: Secondary | ICD-10-CM | POA: Diagnosis not present

## 2022-01-31 DIAGNOSIS — B182 Chronic viral hepatitis C: Secondary | ICD-10-CM | POA: Diagnosis not present

## 2022-01-31 DIAGNOSIS — Z6835 Body mass index (BMI) 35.0-35.9, adult: Secondary | ICD-10-CM | POA: Diagnosis not present

## 2022-01-31 DIAGNOSIS — R030 Elevated blood-pressure reading, without diagnosis of hypertension: Secondary | ICD-10-CM | POA: Diagnosis not present

## 2022-01-31 DIAGNOSIS — M545 Low back pain, unspecified: Secondary | ICD-10-CM | POA: Diagnosis not present

## 2022-01-31 DIAGNOSIS — K7460 Unspecified cirrhosis of liver: Secondary | ICD-10-CM | POA: Diagnosis not present

## 2022-01-31 DIAGNOSIS — M7661 Achilles tendinitis, right leg: Secondary | ICD-10-CM | POA: Diagnosis not present

## 2022-01-31 DIAGNOSIS — R7309 Other abnormal glucose: Secondary | ICD-10-CM | POA: Diagnosis not present

## 2022-02-03 ENCOUNTER — Ambulatory Visit (INDEPENDENT_AMBULATORY_CARE_PROVIDER_SITE_OTHER): Payer: Medicare HMO | Admitting: Family Medicine

## 2022-02-03 ENCOUNTER — Other Ambulatory Visit: Payer: Self-pay

## 2022-02-03 ENCOUNTER — Encounter: Payer: Self-pay | Admitting: Family Medicine

## 2022-02-03 VITALS — BP 139/97 | HR 83 | Wt 248.0 lb

## 2022-02-03 DIAGNOSIS — N529 Male erectile dysfunction, unspecified: Secondary | ICD-10-CM | POA: Diagnosis not present

## 2022-02-03 DIAGNOSIS — E119 Type 2 diabetes mellitus without complications: Secondary | ICD-10-CM

## 2022-02-03 LAB — POCT GLYCOSYLATED HEMOGLOBIN (HGB A1C): HbA1c, POC (controlled diabetic range): 8.4 % — AB (ref 0.0–7.0)

## 2022-02-03 MED ORDER — METFORMIN HCL 1000 MG PO TABS: 1000.0000 mg | ORAL_TABLET | Freq: Two times a day (BID) | ORAL | 3 refills | Status: DC

## 2022-02-03 MED ORDER — SILDENAFIL CITRATE 20 MG PO TABS: 20.0000 mg | ORAL_TABLET | Freq: Every day | ORAL | 0 refills | Status: DC | PRN

## 2022-02-03 NOTE — Progress Notes (Unsigned)
    SUBJECTIVE:   CHIEF COMPLAINT / HPI:   Patient presents for follow up.   DM2: Last A1c in March was 8.4 Takes Metformin 1 tablet daily  Has had diabetic eye exam this year  Not taking crestor   Umbilical hernia: Has surgery appointment scheduled but not sure when   Endorses issues with erection that started several years ago Able to climb a flight of stairs without issues  No SOB on exertion   PERTINENT  PMH / PSH: ***  OBJECTIVE:   There were no vitals taken for this visit.   Physical exam General: well appearing, NAD Cardiovascular: RRR, no murmurs Lungs: CTAB. Normal WOB Abdomen: soft, non-distended, non-tender Skin: warm, dry. No edema  ASSESSMENT/PLAN:   No problem-specific Assessment & Plan notes found for this encounter.   DM2 A1c today 8.4  [place]

## 2022-02-03 NOTE — Patient Instructions (Signed)
It was great seeing you today!  I have sent in more metformin because I would like you to take the higher dose of 1000 mg twice a day.  For the first week you can take the 500 mg twice a day that you have at home.  I have also sent in the medication for erectile dysfunction to take an hour before intercourse.   I will see you back in 3 months for your next diabetes follow up, but if you need to be seen earlier than that for any new issues we're happy to fit you in, just give [place] a call!  Visit Reminders: - Stop by the pharmacy to pick up your prescriptions  - Continue to work on your healthy eating habits and incorporating exercise into your daily life.   Feel free to call with any questions or concerns at any time, at [phone].   Take care,  Dr. Shary Key [facility]

## 2022-02-06 NOTE — Assessment & Plan Note (Signed)
Patient does not have a contraindication to medication. Prescribed Sildenafil '20mg'$  to use an hour before intercourse but if it does not help can take 2 prior to intercourse. May need at least '60mg'$ . Will send in more if he desires to continue on it

## 2022-02-06 NOTE — Assessment & Plan Note (Signed)
A1c today 8.4, which is the same he has had 7 months ago. Discussed increasing Metformin to '500mg'$  BID for the next week and then to '1000mg'$  BID to achieve better control since he would prefer not to add an agent if possible. Also discussed lifestyle changes including diet and exercise. Will follow up in 3 months for next A1c check.

## 2022-02-09 ENCOUNTER — Ambulatory Visit: Payer: Self-pay

## 2022-02-09 NOTE — Patient Outreach (Signed)
  Care Coordination   Initial Visit Note   02/09/2022 Name: Cameron Diaz MRN: [id number] DOB: 09/05/1960  Cameron Diaz is a 61 y.o. year old male who sees Cameron Key, DO for primary care. I spoke with  Cameron Diaz by phone today.  What matters to the patients health and wellness today?  I want to lower my A1c    Goals Addressed             This Visit's Progress    COMPLETED: Care Coordination no follow up required at this time.I want to lower my A1c       Care Coordination Interventions: Evaluation of current treatment plan related to DM and patient's adherence to plan as established by provider Advised patient, providing education and rationale, to check cbg  and record, calling office for findings outside established parameters  Active listening / Reflection utilized  Problem Solving [place] strategies reviewed I had a conversation with Cameron Diaz today, and he mentioned that he couldn't discuss his medical history since he was out in public. He went to the doctor on 02/03/22, and his doctor increased his metformin dosage to 1000 mg bid. He is currently taking that dose without any side effects. I explained to him the importance of checking his blood sugar, and he agreed to do so. He wants to wait until his next appointment in three months to see if his A1c levels have decreased from 8.4 and if he needs my assistance.         SDOH assessments and interventions completed:  Yes  SDOH Interventions Today    Flowsheet Row Most Recent Value  SDOH Interventions   Food Insecurity Interventions Intervention Not Indicated  Transportation Interventions Intervention Not Indicated        Care Coordination Interventions Activated:  Yes  Care Coordination Interventions:  Yes, provided   Follow up plan: No further intervention required.   Encounter Outcome:  Pt. Visit Completed   Lazaro Arms RN, BSN, [facility]   Phone:  [phone]

## 2022-02-09 NOTE — Patient Instructions (Signed)
Visit Information  Thank you for taking time to visit with me today. Please don't hesitate to contact me if I can be of assistance to you.   Following are the goals we discussed today:   Goals Addressed             This Visit's Progress    COMPLETED: Care Coordination no follow up required at this time.I want to lower my A1c       Care Coordination Interventions: Evaluation of current treatment plan related to DM and patient's adherence to plan as established by provider Advised patient, providing education and rationale, to check cbg  and record, calling office for findings outside established parameters  Active listening / Reflection utilized  Problem Solving [place] strategies reviewed I had a conversation with Mr. Amsden today, and he mentioned that he couldn't discuss his medical history since he was out in public. He went to the doctor on 02/03/22, and his doctor increased his metformin dosage to 1000 mg bid. He is currently taking that dose without any side effects. I explained to him the importance of checking his blood sugar, and he agreed to do so. He wants to wait until his next appointment in three months to see if his A1c levels have decreased from 8.4 and if he needs my assistance.        If you are experiencing a Mental Health or [place] or need someone to talk to, please call 1-800-273-TALK (toll free, 24 hour hotline)  Patient verbalizes understanding of instructions and care plan provided today and agrees to view in [place]. Active MyChart status and patient understanding of how to access instructions and care plan via MyChart confirmed with patient.     Lazaro Arms RN, BSN, [facility]   Phone: [phone]

## 2022-02-24 DIAGNOSIS — Z79899 Other long term (current) drug therapy: Secondary | ICD-10-CM | POA: Diagnosis not present

## 2022-02-24 DIAGNOSIS — M545 Low back pain, unspecified: Secondary | ICD-10-CM | POA: Diagnosis not present

## 2022-02-24 DIAGNOSIS — K7460 Unspecified cirrhosis of liver: Secondary | ICD-10-CM | POA: Diagnosis not present

## 2022-02-24 DIAGNOSIS — Z6836 Body mass index (BMI) 36.0-36.9, adult: Secondary | ICD-10-CM | POA: Diagnosis not present

## 2022-02-24 DIAGNOSIS — R030 Elevated blood-pressure reading, without diagnosis of hypertension: Secondary | ICD-10-CM | POA: Diagnosis not present

## 2022-02-24 DIAGNOSIS — R7309 Other abnormal glucose: Secondary | ICD-10-CM | POA: Diagnosis not present

## 2022-02-24 DIAGNOSIS — B182 Chronic viral hepatitis C: Secondary | ICD-10-CM | POA: Diagnosis not present

## 2022-02-24 DIAGNOSIS — G8929 Other chronic pain: Secondary | ICD-10-CM | POA: Diagnosis not present

## 2022-02-24 DIAGNOSIS — M7661 Achilles tendinitis, right leg: Secondary | ICD-10-CM | POA: Diagnosis not present

## 2022-04-01 ENCOUNTER — Other Ambulatory Visit: Payer: Self-pay | Admitting: Family Medicine

## 2022-04-01 DIAGNOSIS — I10 Essential (primary) hypertension: Secondary | ICD-10-CM

## 2022-06-21 ENCOUNTER — Encounter (INDEPENDENT_AMBULATORY_CARE_PROVIDER_SITE_OTHER): Payer: Medicare HMO | Admitting: Pharmacist

## 2022-06-21 ENCOUNTER — Encounter: Payer: Self-pay | Admitting: Pharmacist

## 2022-06-21 VITALS — BP 147/89 | HR 90 | Wt 248.4 lb

## 2022-06-21 DIAGNOSIS — E119 Type 2 diabetes mellitus without complications: Secondary | ICD-10-CM

## 2022-06-21 LAB — POCT GLYCOSYLATED HEMOGLOBIN (HGB A1C): HbA1c, POC (controlled diabetic range): 8.1 % — AB (ref 0.0–7.0)

## 2022-06-21 NOTE — Patient Instructions (Addendum)
It was great to see you today!  We placed a Freestyle Libre Continuous Blood Glucose Monitor today. If the sensor falls off early, put it in a plastic bag and bring back to clinic. Avoid high dose salicylic acid (aspirin) and vitamin C products while wearing sensor. Continue checking blood glucose and taking medications as normal.   Remember to replace your sensor ever 14 days. As you are monitoring your blood sugar readings, your goal blood sugar when you wake up in the mornings is 80-130 mg/dL. Your goal blood sugar after a meal is less than 180 mg/dL.

## 2022-06-21 NOTE — Assessment & Plan Note (Signed)
LIBERATE Study:  -Patient provided verbal consent to participate in the study. Consent documented in electronic medical record.  -Provided education on Libre 3 CGM. Collaborated to ensure [employer] 3 app was downloaded on patient's phone. Educated on how to place sensor every 14 days, patient placed first sensor correctly and verbalized understanding of use, removal, and how to place next sensor. Discussed alarms. 8 sensors provided for a 3 month supply. Educated to contact the office if the sensor falls off early and replacements are needed before their next [employer].  Diabetes diagnosed in 2012 currently not controlled with an A1c of 8%. Patient is able to verbalize appropriate hypoglycemia management plan. Control is suboptimal due to dietary habits and not regularly checking blood sugar. - Continued GLP-1 Mounjaro (tirzepatide) 2.5 mg once weekly -Patient educated on purpose, proper use, and potential adverse effects. Continue to monitor mild GI side effects at next visit. -Extensively discussed pathophysiology of diabetes, recommended lifestyle interventions, dietary effects on blood sugar control.  -Counseled on s/sx of and management of hypoglycemia.  -Next A1c anticipated 09/2022.

## 2022-06-21 NOTE — Research (Signed)
S:     Chief Complaint  Patient presents with   Medication Management    Liberate - CGM Study enrollment    62 y.o. male who presents for diabetes evaluation, education, and management in the context of the LIBERATE Study.   PMH is significant for alcohol use disorder, depression, hypertension.  Patient was referred and last seen by Primary Care Provider, Dr. Arby Barrette, on 02/03/22.   At last visit, PCP increased dose of metformin from 500 mg daily to 500 mg twice daily. However, patient states he has since stopped the metformin and has started [employer] (tirzepatide). Patient also states he was switched from lisinopril to amlodipine 1 week ago.  Today, patient arrives in good spirits and presents without any assistance.  Patient reports Diabetes was diagnosed in 2012.   Family/Social History: reports brother has been diagnosed with diabetes  Current diabetes medications include: Mounjaro (tirzepatide) 2.5 mg once weekly Current hypertension medications include: amlodipine 10 mg once daily Current hyperlipidemia medications include: none  Patient reports having multiple medication changes recently - states he was told to stop lisinopril and start amlodipine "due to swelling in his legs" and stopped his metformin after he was started on Mounjaro (tirzepatide). In office today, states he only takes three medications at home currently: amlodipine, Mounjaro, and oxycodone-acetaminophen.   Insurance coverage: Medicare  Patient denies hypoglycemic events.  Patient denies nocturia (nighttime urination).  Patient denies neuropathy (nerve pain). Patient denies visual changes. Patient denies self foot exams.   Patient reported dietary habits: reports feeling nausea with newly started Mounjaro (tirzepatide). Difficult to eat large meals. Feels bloated after small meals such as a sandwich and chips.  O:   Review of Systems  All other systems reviewed and are negative.  Physical  Exam Constitutional:      Appearance: Normal appearance.  Pulmonary:     Effort: Pulmonary effort is normal.  Neurological:     Mental Status: He is alert.  Psychiatric:        Mood and Affect: Mood normal.        Behavior: Behavior normal.    Lab Results  Component Value Date   HGBA1C 8.1 (A) 06/21/2022    Vitals:   06/21/22 1003 06/21/22 1005  BP: (!) 147/94 (!) 147/89  Pulse: 90   SpO2: 96%     A/P: LIBERATE Study:  -Patient provided verbal consent to participate in the study. Consent documented in electronic medical record.  -Provided education on Libre 3 CGM. Collaborated to ensure [employer]ato 3 app was downloaded on patient's phone. Educated on how to place sensor every 14 days, patient placed first sensor correctly and verbalized understanding of use, removal, and how to place next sensor. Discussed alarms. 8 sensors provided for a 3 month supply. Educated to contact the office if the sensor falls off early and replacements are needed before their next [employer].  Diabetes diagnosed in 2012 currently not controlled with an A1c of 8%. Patient is able to verbalize appropriate hypoglycemia management plan. Control is suboptimal due to dietary habits and not regularly checking blood sugar. - Continued GLP-1 Mounjaro (tirzepatide) 2.5 mg once weekly -Patient educated on purpose, proper use, and potential adverse effects. Continue to monitor mild GI side effects at next visit. -Extensively discussed pathophysiology of diabetes, recommended lifestyle interventions, dietary effects on blood sugar control.  -Counseled on s/sx of and management of hypoglycemia.  -Next A1c anticipated 09/2022.   Written patient instructions provided. Patient verbalized understanding of treatment plan.  Total time in face to face counseling 30 minutes.    Follow-up:  Pharmacist in 2 weeks. Patient seen with Louanne Belton PharmD PGY-1 Pharmacy Resident and Estelle June, PharmD Candidate.

## 2022-06-21 NOTE — Progress Notes (Signed)
Reviewed and agree with Dr Koval's plan.   

## 2022-06-27 ENCOUNTER — Telehealth: Payer: Self-pay | Admitting: Pharmacist

## 2022-06-27 NOTE — Telephone Encounter (Signed)
LIBERATE Study  Patient completed first study visit for the LIBERATE CGM Study. Contacted patient to discuss CGM tolerability. Confirmed HIPAA identifiers.   "Control is very good with most readings in range"  Patient confirms [place] 3 sensors is working and glucose values are transmitting appropriately. Denies any questions or concerns.   - Confirmed visit on 3/26 to review CGM report. Confirmed patient has transportation to this appointment.   Phone call conducted by Estelle June, PharmD Candidate.

## 2022-07-04 ENCOUNTER — Ambulatory Visit (INDEPENDENT_AMBULATORY_CARE_PROVIDER_SITE_OTHER): Payer: Medicare HMO | Admitting: Pharmacist

## 2022-07-04 ENCOUNTER — Encounter: Payer: Self-pay | Admitting: Pharmacist

## 2022-07-04 VITALS — BP 142/92 | HR 96 | Wt 245.4 lb

## 2022-07-04 DIAGNOSIS — E1159 Type 2 diabetes mellitus with other circulatory complications: Secondary | ICD-10-CM | POA: Diagnosis not present

## 2022-07-04 DIAGNOSIS — I152 Hypertension secondary to endocrine disorders: Secondary | ICD-10-CM

## 2022-07-04 DIAGNOSIS — E119 Type 2 diabetes mellitus without complications: Secondary | ICD-10-CM

## 2022-07-04 MED ORDER — TIRZEPATIDE 5 MG/0.5ML ~~LOC~~ SOAJ: 5.0000 mg | SUBCUTANEOUS | 1 refills | Status: DC

## 2022-07-04 MED ORDER — CARVEDILOL 6.25 MG PO TABS: 6.2500 mg | ORAL_TABLET | Freq: Two times a day (BID) | ORAL | 3 refills | Status: DC

## 2022-07-04 MED ORDER — AMLODIPINE BESYLATE 5 MG PO TABS: 5.0000 mg | ORAL_TABLET | Freq: Every day | ORAL | 3 refills | Status: DC

## 2022-07-04 NOTE — Assessment & Plan Note (Signed)
Diabetes, diagnosed in 2016, currently well controlled with most recent CGM report showing GMI of 6.7% and an average glucose of 143 mg/dL. Patient is able to verbalize appropriate hypoglycemia management plan. Medication adherence appears good.  -Increased GLP/GIP Mounjaro (tirzepatide) from 2.5 mg to 5 mg once weekly -Discussed re-initiating metformin during visit, patient declined -Patient educated on purpose, proper use, and potential adverse effects  -Extensively discussed pathophysiology of diabetes, recommended lifestyle interventions, dietary effects on blood sugar control.  -Counseled on s/sx of and management of hypoglycemia.
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
## 2022-07-04 NOTE — Patient Instructions (Signed)
It was nice to see you today!  Your goal blood sugar is 80-130 before eating and less than 180 after eating.  Medication Changes: INCREASE Mounjaro (tirzepatide) to 5 mg once weekly  STOP metoprolol START carvedilol 6.25 mg twice daily  CONTINUE amlodipine 5 mg daily  Monitor blood sugars at home and keep a log (glucometer or piece of paper) to bring with you to your next visit.  Keep up the good work with diet and exercise. Aim for a diet full of vegetables, fruit and lean meats (chicken, [place], fish). Try to limit salt intake by eating fresh or frozen vegetables (instead of canned), rinse canned vegetables prior to cooking and do not add any additional salt to meals.

## 2022-07-04 NOTE — Assessment & Plan Note (Signed)
Hypertension, diagnosed in 2016, currently uncontrolled with a blood pressure goal of <130/80 mmHg. Patient reports being fairly non-adherent to antihypertensive medications- states he typically takes metoprolol tartrate once daily. Blood pressure control is suboptimal due to medication non-compliance. Office blood pressure reading could also be elevated due to patient rushing to get to appointment as well as trying to help someone with their care issues in the parking lot. -Discontinued metoprolol tartrate -Initiated carvedilol 6.25 mg twice daily -Continued amlodipine 5 mg once daily -Patient educated on purpose, proper use and potential adverse effects.  Following instruction patient verbalized understanding of treatment plan.

## 2022-07-05 NOTE — Progress Notes (Signed)
Reviewed and agree with Dr Koval's plan.   

## 2022-08-03 ENCOUNTER — Emergency Department (HOSPITAL_COMMUNITY): Payer: Medicare HMO

## 2022-08-03 ENCOUNTER — Other Ambulatory Visit: Payer: Self-pay

## 2022-08-03 ENCOUNTER — Encounter (HOSPITAL_COMMUNITY): Payer: Self-pay

## 2022-08-03 ENCOUNTER — Emergency Department (HOSPITAL_COMMUNITY)
Admission: EM | Admit: 2022-08-03 | Discharge: 2022-08-03 | Disposition: A | Discharge status: Home / Self Care | Payer: Medicare HMO | Attending: Emergency Medicine | Admitting: Emergency Medicine

## 2022-08-03 DIAGNOSIS — K5903 Drug induced constipation: Secondary | ICD-10-CM | POA: Diagnosis not present

## 2022-08-03 DIAGNOSIS — R160 Hepatomegaly, not elsewhere classified: Secondary | ICD-10-CM | POA: Diagnosis not present

## 2022-08-03 DIAGNOSIS — R109 Unspecified abdominal pain: Secondary | ICD-10-CM | POA: Diagnosis present

## 2022-08-03 LAB — BASIC METABOLIC PANEL
Anion gap: 10 (ref 5–15)
BUN: 11 mg/dL (ref 8–23)
CO2: 23 mmol/L (ref 22–32)
Calcium: 9.5 mg/dL (ref 8.9–10.3)
Chloride: 103 mmol/L (ref 98–111)
Creatinine, Ser: 0.86 mg/dL (ref 0.61–1.24)
GFR, Estimated: >60 mL/min (ref >60)
Glucose, Bld: 160 mg/dL — ABNORMAL HIGH (ref 70–99)
Potassium: 3.6 mmol/L (ref 3.5–5.1)
Sodium: 136 mmol/L (ref 135–145)

## 2022-08-03 LAB — HEPATIC FUNCTION PANEL
ALT: 21 U/L (ref 0–44)
AST: 20 U/L (ref 15–41)
Albumin: 4.5 g/dL (ref 3.5–5.0)
Alkaline Phosphatase: 67 U/L (ref 38–126)
Bilirubin, Direct: 0.2 mg/dL (ref 0.0–0.2)
Indirect Bilirubin: 0.6 mg/dL (ref 0.3–0.9)
Total Bilirubin: 0.8 mg/dL (ref 0.3–1.2)
Total Protein: 8.5 g/dL — ABNORMAL HIGH (ref 6.5–8.1)

## 2022-08-03 LAB — CBC
HCT: 43.4 % (ref 39.0–52.0)
Hemoglobin: 16.1 g/dL (ref 13.0–17.0)
MCH: 31.1 pg (ref 26.0–34.0)
MCHC: 37.1 g/dL — ABNORMAL HIGH (ref 30.0–36.0)
MCV: 83.8 fL (ref 80.0–100.0)
Platelets: 249 10*3/uL (ref 150–400)
RBC: 5.18 MIL/uL (ref 4.22–5.81)
RDW: 12.9 % (ref 11.5–15.5)
WBC: 6.7 10*3/uL (ref 4.0–10.5)
nRBC: 0 % (ref 0.0–0.2)

## 2022-08-03 LAB — URINALYSIS, ROUTINE W REFLEX MICROSCOPIC
Bilirubin Urine: NEGATIVE
Glucose, UA: NEGATIVE mg/dL
Hgb urine dipstick: NEGATIVE
Ketones, ur: NEGATIVE mg/dL
Leukocytes,Ua: NEGATIVE
Nitrite: NEGATIVE
Protein, ur: NEGATIVE mg/dL
Specific Gravity, Urine: 1.008 (ref 1.005–1.030)
pH: 6 (ref 5.0–8.0)

## 2022-08-03 LAB — LIPASE, BLOOD: Lipase: 22 U/L (ref 11–51)

## 2022-08-03 MED ORDER — HYDROMORPHONE HCL 1 MG/ML IJ SOLN
0.5000 mg | Freq: Once | INTRAMUSCULAR | Status: AC
  Administered 2022-08-03: 0.5 mg via INTRAVENOUS
  Filled 2022-08-03: qty 1

## 2022-08-03 NOTE — ED Provider Notes (Signed)
[place] EMERGENCY DEPARTMENT AT [facility] Provider Note   CSN: [id number] Arrival date & time: 08/03/22  1103     History  Chief Complaint  Patient presents with   Flank Pain    Cameron Diaz is a 62 y.o. male.  Patient complains of pain in his right flank.  Patient thinks this may be due to constipation.  Patient reports he began taking fish oil 3 days ago and has noticed decreased bowel movements since starting the fish oil.  Patient is on Percocet 4 times a day.  Patient denies any fever or chills he denies any cough or congestion patient points to his right flank and his right upper abdomen as the area of discomfort.  He has not had any nausea vomiting or diarrhea  The history is provided by the patient. No language interpreter was used.  Flank Pain       Home Medications Prior to Admission medications   Medication Sig Start Date End Date Taking? Authorizing Provider  amLODipine (NORVASC) 5 MG tablet Take 1 tablet (5 mg total) by mouth at bedtime. 07/04/22   McDiarmid, Leighton Roach, MD  bacitracin-polymyxin b (POLYSPORIN) ophthalmic ointment  01/12/21   [provider]  carvedilol (COREG) 6.25 MG tablet Take 1 tablet (6.25 mg total) by mouth 2 (two) times daily with a meal. 07/04/22   McDiarmid, Leighton Roach, MD  diclofenac Sodium (VOLTAREN) 1 % GEL Apply 4 g topically See admin instructions. Apply 4 grams to entire lower back ("hip to hip") two times a day    [provider]  escitalopram (LEXAPRO) 10 MG tablet Take 1 tablet by mouth once daily 05/24/20   Allayne Stack, DO  ibuprofen (ADVIL) 200 MG tablet Take 800 mg by mouth every 6 (six) hours as needed for mild pain.    [provider]  lidocaine (LIDODERM) 5 % Place 1 patch onto the skin daily. Remove & Discard patch within 12 hours or as directed by MD 03/30/21   Fayette Pho, MD  lisinopril (ZESTRIL) 20 MG tablet Take 1 tablet by mouth once daily 12/03/21   Cora Collum, DO   metFORMIN (GLUCOPHAGE) 1000 MG tablet Take 1 tablet (1,000 mg total) by mouth 2 (two) times daily with a meal. Patient not taking: Reported on 07/04/2022 02/03/22   Cora Collum, DO  naproxen (NAPROSYN) 500 MG tablet Take 1 tablet (500 mg total) by mouth 2 (two) times daily with a meal. Patient not taking: Reported on 12/14/2021 03/30/21   Fayette Pho, MD  ofloxacin (OCUFLOX) 0.3 % ophthalmic solution Place 2 drops into the right eye daily. 01/12/21   [provider]  oxyCODONE-acetaminophen (PERCOCET/ROXICET) 5-325 MG tablet Take 1-2 tablets by mouth every 4 (four) hours as needed for moderate pain. 05/24/21   [provider]  rosuvastatin (CRESTOR) 10 MG tablet Take 10 mg by mouth daily. Patient not taking: Reported on 06/21/2022 09/14/20   [provider]  sildenafil (REVATIO) 20 MG tablet Take 1 tablet (20 mg total) by mouth daily as needed. Patient not taking: Reported on 06/21/2022 02/03/22   Cora Collum, DO  tirzepatide [facility]) 5 MG/0.5ML Pen Inject 5 mg into the skin once a week. 07/04/22   McDiarmid, Leighton Roach, MD      Allergies    Patient has no known allergies.    Review of Systems   Review of Systems  Gastrointestinal:  Positive for constipation. Negative for nausea and vomiting.  Genitourinary:  Positive  for flank pain.  All other systems reviewed and are negative.   Physical Exam Updated Vital Signs BP (!) 154/101 (BP Location: Left Arm)   Pulse 100   Temp 98.1 F (36.7 C) (Oral)   Resp 17   Ht  (1.778 m)   Wt 108.9 kg   SpO2 96%   BMI 34.44 kg/m  Physical Exam Vitals and nursing note reviewed.  Constitutional:      Appearance: He is well-developed.  HENT:     Head: Normocephalic.     Nose: Nose normal.  Eyes:     Pupils: Pupils are equal, round, and reactive to light.  Cardiovascular:     Rate and Rhythm: Normal rate and regular rhythm.  Pulmonary:     Effort: Pulmonary effort is normal.  Abdominal:     General:  Abdomen is flat. There is no distension.     Tenderness: There is abdominal tenderness.     Comments: Tender right flank minimal diffuse tenderness right upper quadrant  Musculoskeletal:        General: Normal range of motion.     Cervical back: Normal range of motion.  Skin:    General: Skin is warm.  Neurological:     General: No focal deficit present.     Mental Status: He is alert and oriented to person, place, and time.  Psychiatric:        Mood and Affect: Mood normal.     ED Results / Procedures / Treatments   Labs (all labs ordered are listed, but only abnormal results are displayed) Labs Reviewed  URINALYSIS, ROUTINE W REFLEX MICROSCOPIC - Abnormal; Notable for the following components:      Result Value   Color, Urine STRAW (*)    All other components within normal limits  BASIC METABOLIC PANEL - Abnormal; Notable for the following components:   Glucose, Bld 160 (*)    All other components within normal limits  CBC - Abnormal; Notable for the following components:   MCHC 37.1 (*)    All other components within normal limits  HEPATIC FUNCTION PANEL - Abnormal; Notable for the following components:   Total Protein 8.5 (*)    All other components within normal limits  LIPASE, BLOOD    EKG None  Radiology CT Renal Stone Study  Result Date: 08/03/2022 CLINICAL DATA:  Right-sided flank pain EXAM: CT ABDOMEN AND PELVIS WITHOUT CONTRAST TECHNIQUE: Multidetector CT imaging of the abdomen and pelvis was performed following the standard protocol without IV contrast. RADIATION DOSE REDUCTION: This exam was performed according to the departmental dose-optimization program which includes automated exposure control, adjustment of the mA and/or kV according to patient size and/or use of iterative reconstruction technique. COMPARISON:  CT abdomen and pelvis dated October 17, 2017 FINDINGS: Lower chest: Subpleural solid pulmonary nodules of the right middle lobe measuring 5 mm on  series 5, image 2 image 8. Mild aortic valve calcifications. Small hiatal hernia. Hepatobiliary: Cirrhotic liver morphology. New subtle hypoattenuating lesion of the left lobe of the liver measuring 11 mm on series 2, image 16 and additional hypoattenuating lesion of the right lobe of the liver measuring 10 mm on image 16. Gallbladder is unremarkable. No biliary ductal dilation. Pancreas: Unremarkable. No pancreatic ductal dilatation or surrounding inflammatory changes. Spleen: Normal in size without focal abnormality. Adrenals/Urinary Tract: Bilateral adrenal glands are unremarkable. No hydronephrosis or nephrolithiasis. Bladder is unremarkable. Stomach/Bowel: Small hiatal hernia. Stomach is within normal limits. Appendix appears normal. Mild diverticulosis. No  evidence of bowel wall thickening, distention, or inflammatory changes. Vascular/Lymphatic: Aortic atherosclerosis. No enlarged abdominal or pelvic lymph nodes. Prominent gastrohepatic ligament lymph nodes, unchanged when compared with the prior exam and likely reactive. Reference node measuring 8 mm in short axis on series 2, image 22, unchanged. No pathologically enlarged lymph nodes seen in the abdomen or pelvis. Reproductive: Prostate is unremarkable. Other: Rectus diastasis and small periumbilical hernia containing fat and a nondilated loop of small bowel. No abdominopelvic ascites. Musculoskeletal: No acute or significant osseous findings. IMPRESSION: 1. No acute findings in the abdomen or pelvis. 2. Cirrhotic liver morphology. New hypoattenuating lesion of the left lobe of the liver and additional smaller hypoattenuating lesion of the right lobe of the liver. Recommend non-emergent contrast-enhanced liver MRI for further evaluation. 3. Subpleural solid pulmonary nodules of the right middle lobe measuring 5 mm. No follow-up needed if patient is low-risk.This recommendation follows the consensus statement: Guidelines for Management of Incidental  Pulmonary Nodules Detected on CT Images: From the Fleischner Society 2017; Radiology 2017; 284:228-243. 4. Aortic Atherosclerosis (ICD10-I70.0). Electronically Signed   By: Allegra Lai M.D.   On: 08/03/2022 13:56    Procedures Procedures    Medications Ordered in ED Medications  HYDROmorphone (DILAUDID) injection 0.5 mg (0.5 mg Intravenous Given 08/03/22 1340)    ED Course/ Medical Decision Making/ A&P                             Medical Decision Making Complains of right flank pain and right upper abdominal pain.  Patient reports he feels like he is constipated since starting to take fish oil  Amount and/or Complexity of Data Reviewed Independent Historian: spouse    Details: Pt is here with his wife who is supportive External Data Reviewed: notes.    Details: Care notes are reviewed Labs: ordered. Decision-making details documented in ED Course.    Details: Labs ordered reviewed and interpreted.  Bilirubin is normal liver functions are normal patient has normal white blood count urinalysis is negative Radiology: ordered.    Details: T renal shows a 1 cm new liver lesion. Discussion of management or test interpretation with external provider(s): I will send a message to patient's primary care physician that he needs to have follow-up the radiologist has recommended MRI  Risk Prescription drug management. Risk Details: I discussed treatment of constipation with patient I also discussed the need to have follow-up of his liver mass.  Patient understands and he has scheduled an appointment with family practice tomorrow           Final Clinical Impression(s) / ED Diagnoses Final diagnoses:  Liver mass  Drug-induced constipation    Rx / DC Orders ED Discharge Orders     None      An After Visit Summary was printed and given to the patient.    Elson Areas, [place] 08/03/22 1452    Rondel Baton, MD 08/05/22 1019

## 2022-08-03 NOTE — ED Triage Notes (Signed)
Patient has had right sided flank pain for 2 days. No burning urination.

## 2022-08-03 NOTE — Discharge Instructions (Addendum)
The CT scan of your abdomen showed a new mass in your liver.  You will need to schedule to see your primary care physician at family practice and they will need to set you up for further evaluation.  The radiologist has recommended that you be scheduled for an MRI. The narcotic pain medicine that you are taking may cause constipation.  Try using a fleets enema twice a day for the next 2 days.  Take a dose of Ducalax.  You probably should take a dose of MiraLAX daily.

## 2022-08-04 ENCOUNTER — Ambulatory Visit (INDEPENDENT_AMBULATORY_CARE_PROVIDER_SITE_OTHER): Payer: Medicare HMO | Admitting: Family Medicine

## 2022-08-04 VITALS — BP 135/97 | HR 100 | Temp 97.9°F | Ht 70.0 in | Wt 242.5 lb

## 2022-08-04 DIAGNOSIS — R160 Hepatomegaly, not elsewhere classified: Secondary | ICD-10-CM | POA: Diagnosis not present

## 2022-08-04 DIAGNOSIS — K5903 Drug induced constipation: Secondary | ICD-10-CM

## 2022-08-04 MED ORDER — SENNA 8.6 MG PO CAPS
1.0000 | ORAL_CAPSULE | Freq: Two times a day (BID) | ORAL | 0 refills | Status: DC
Start: 2022-08-04 — End: 2024-02-28

## 2022-08-04 NOTE — Assessment & Plan Note (Signed)
Incidental finding on CT scan in the ED.  Will follow-up with liver MRI per radiology recommendations.

## 2022-08-04 NOTE — Patient Instructions (Addendum)
It was nice seeing you today!  Go to your MRI as scheduled.  Continue taking the MiraLAX 3 capfuls per day.  Start taking the senna twice a day for now.  Please return if things are not getting better in the next few days.  Stay well, Littie Deeds, MD [facility] [phone]  --  Make sure to check out at the front desk before you leave today.  Please arrive at least 15 minutes prior to your scheduled appointments.  If you had blood work today, I will send you a MyChart message or a letter if results are normal. Otherwise, I will give you a call.  If you had a referral placed, they will call you to set up an appointment. Please give [place] a call if you don't hear back in the next 2 weeks.  If you need additional refills before your next appointment, please call your pharmacy first.

## 2022-08-04 NOTE — Progress Notes (Unsigned)
SUBJECTIVE:   CHIEF COMPLAINT / HPI:    Patient was evaluated in the ED yesterday for right flank pain.  CT renal stone study obtained did not find any acute findings but did reveal cirrhotic liver morphology with lesion of the left lobe of the liver and smaller lesion on the right lobe of the liver.  Recommendation for nonemergent liver MRI.  Today, patient reports he is still having significant abdominal discomfort and bloating.  He reports he has not had a good bowel movement in 4 days.  He reports currently taking the Percocet 5-325 mg 5 to 6 tablets a day, though he has not taken any in the past 2 to 3 days. He believes he is very constipated.  He did have 2 small bowel movements this morning but still feels significant bloating. Yesterday he took MiraLAX 24 ounces divided into 3 bottles, and Dulcolax 4 times. Not eating as much due to bloating.  Unsure if eating exacerbates pain. Denies nausea, vomiting, diarrhea, blood in stool, melena.  PERTINENT  PMH / PSH: T2DM, HTN, HLD, hepatitis C, cirrhosis, history of alcohol and drug abuse Appendectomy  Patient Care Team: Cora Collum, DO as PCP - General (Family Medicine)   OBJECTIVE:   BP (!) 135/97   Pulse 100   Temp 97.9 F (36.6 C) (Oral)   Ht 5\' 10"  (1.778 m)   Wt 242 lb 8 oz (110 kg)   SpO2 98%   BMI 34.80 kg/m   Physical Exam Constitutional:      General: He is not in acute distress.    Comments: Appears uncomfortable  Cardiovascular:     Rate and Rhythm: Normal rate and regular rhythm.  Pulmonary:     Effort: Pulmonary effort is normal. No respiratory distress.     Breath sounds: Normal breath sounds.  Abdominal:     General: Bowel sounds are normal.     Palpations: Abdomen is soft.     Tenderness: There is abdominal tenderness. There is no rebound.     Comments: Diffusely tender throughout  Neurological:     Mental Status: He is alert.         08/04/2022    2:36 PM  Depression screen PHQ 2/9   Decreased Interest 0  Down, Depressed, Hopeless 0  PHQ - 2 Score 0  Altered sleeping 0  Tired, decreased energy 0  Change in appetite 0  Feeling bad or failure about yourself  0  Trouble concentrating 0  Moving slowly or fidgety/restless 0  Suicidal thoughts 0  PHQ-9 Score 0     {Show previous vital signs (optional):23777}  {Labs  Heme  Chem  Endocrine  Serology  Results Review (optional):23779}  ASSESSMENT/PLAN:   Problem List Items Addressed This Visit       Other   Liver mass - Primary    Incidental finding on CT scan in the ED.  Will follow-up with liver MRI per radiology recommendations.      Relevant Orders   MR LIVER W CONTRAST   Other Visit Diagnoses     Drug-induced constipation       Relevant Medications   Sennosides (SENNA) 8.6 MG CAPS     He is still having significant abdominal discomfort and bloating, suspect most likely secondary to drug-induced constipation related to opioids.  Some tenderness noted on exam, but no peritoneal signs.  Advised to continue MiraLAX, add on senna to help with constipation.  ED precautions discussed.  Return if symptoms  worsen or fail to improve.   Littie Deeds, MD [facility]

## 2022-08-08 ENCOUNTER — Telehealth: Payer: Self-pay | Admitting: Pharmacist

## 2022-08-08 ENCOUNTER — Ambulatory Visit: Payer: Medicare HMO | Admitting: Pharmacist

## 2022-08-08 NOTE — Telephone Encounter (Signed)
Attempted contact with patient following missed appointment.   Left HIPAA compliant Voice Mail requesting a new visit.   Asked to call and reschedule.

## 2022-08-11 ENCOUNTER — Ambulatory Visit (HOSPITAL_COMMUNITY)
Admission: RE | Admit: 2022-08-11 | Discharge: 2022-08-11 | Disposition: A | Discharge status: Home / Self Care | Payer: Medicare HMO | Source: Ambulatory Visit | Attending: Family Medicine | Admitting: Family Medicine

## 2022-08-11 DIAGNOSIS — R160 Hepatomegaly, not elsewhere classified: Secondary | ICD-10-CM | POA: Insufficient documentation

## 2022-08-11 MED ORDER — GADOBUTROL 1 MMOL/ML IV SOLN
10.0000 mL | Freq: Once | INTRAVENOUS | Status: AC | PRN
  Administered 2022-08-11: 10 mL via INTRAVENOUS

## 2022-08-16 ENCOUNTER — Telehealth: Payer: Self-pay

## 2022-08-16 NOTE — Telephone Encounter (Signed)
Patient calls nurse line requesting MRI results.   Will forward to ordering provider.

## 2022-08-17 NOTE — Telephone Encounter (Signed)
Called patient with MRI results.  2 lesions initially seen on CT appear to be either benign cyst or have resolved.  However, there are changes within the liver that could suggest hepatocellular carcinoma and there is a recommendation to repeat CT scan in 6 months.  Patient also expressed concern that he still has not had a bowel movement in about 1 month.  He is having some discomfort.  He asked about the MRI results regarding his bowels, and I informed him that there were no abnormalities reported regarding his colon or bowels.  He saw his pain specialist who prescribed him linaclotide, but reports that this is not in stock.  He has tried various over-the-counter treatments for his constipation.  He has not tried senna which I recommended at last appointment with him, so I advised that he obtain some over-the-counter and start taking this twice a day.  Advised him to follow-up if no improvement.  FYI to PCP, patient will need repeat CT scan in 6 months.

## 2022-08-24 ENCOUNTER — Telehealth: Payer: Self-pay | Admitting: Family Medicine

## 2022-08-24 NOTE — Telephone Encounter (Signed)
Called patient to schedule Medicare Annual Wellness Visit (AWV). Left message for patient to call back and schedule Medicare Annual Wellness Visit (AWV).  Last date of AWV:  AWVI eligible as of 06/08/2012   Please schedule an AWVI appointment at any time with [facility] ANNUAL WELLNESS VISIT.  If any questions, please contact me at [phone].    Thank you,  Neilson Oehlert  Ambulatory Clinic Support [facility] Direct dial  [phone]   

## 2022-09-05 ENCOUNTER — Other Ambulatory Visit: Payer: Self-pay | Admitting: Family Medicine

## 2022-09-05 DIAGNOSIS — E119 Type 2 diabetes mellitus without complications: Secondary | ICD-10-CM

## 2022-09-19 ENCOUNTER — Telehealth: Payer: Self-pay | Admitting: Student

## 2022-09-19 NOTE — Telephone Encounter (Signed)
Patient came in asking if we can help get more Chase sensors, he is on his last one. Also would like to speak with someone about increasing the dosage on his Mounjaro. Please give him a call, [phone].

## 2022-09-24 ENCOUNTER — Other Ambulatory Visit: Payer: Self-pay | Admitting: Student

## 2022-09-24 DIAGNOSIS — E119 Type 2 diabetes mellitus without complications: Secondary | ICD-10-CM

## 2022-09-24 MED ORDER — FREESTYLE LIBRE SENSOR SYSTEM MISC: 5 refills | Status: DC

## 2022-09-24 MED ORDER — MOUNJARO 7.5 MG/0.5ML ~~LOC~~ SOAJ: 7.5000 mg | SUBCUTANEOUS | 2 refills | Status: DC

## 2022-09-24 NOTE — Progress Notes (Addendum)
Increasing patient's dose of Mounjaro to 7.5 mg, sent in new Rx of freestyle Libre CGM sensors

## 2022-09-24 NOTE — Telephone Encounter (Signed)
Patient called, rx for sensors sent, increased dose of Mounjaro. Patient to make follow up in 1 month

## 2022-09-27 ENCOUNTER — Encounter: Payer: Self-pay | Admitting: Pharmacist

## 2022-09-27 ENCOUNTER — Ambulatory Visit (INDEPENDENT_AMBULATORY_CARE_PROVIDER_SITE_OTHER): Payer: Medicare HMO | Admitting: Pharmacist

## 2022-09-27 VITALS — BP 140/84 | HR 97 | Ht 70.0 in | Wt 243.4 lb

## 2022-09-27 DIAGNOSIS — E119 Type 2 diabetes mellitus without complications: Secondary | ICD-10-CM

## 2022-09-27 LAB — POCT GLYCOSYLATED HEMOGLOBIN (HGB A1C): HbA1c, POC (controlled diabetic range): 6.5 % (ref 0.0–7.0)

## 2022-09-27 NOTE — Progress Notes (Signed)
S:     Chief Complaint  Patient presents with   Medication Management    3 month Liberate Visit   62 y.o. male who presents for diabetes evaluation, education, and management in the context of the LIBERATE Study - 3 month follow-up.  PMH is significant for diabetes, cirrhosis, hypertension, hyperlipidemia, depression, substance use disorder.  Patient last medication adjustment by Primary Care Provider, Dr. Barbaraann Faster, on 09/24/2022 when [facility], (tirzepatide) dose was increased.    Today, patient arrives in good spirits and presents without any assistance.   Patient reports Diabetes was diagnosed in 2012.    Current diabetes medications include: metformin 1000 mg BID (not taking), Mounjaro 7.5 mg weekly Current hypertension medications include: amlodipine 5 mg daily, carvedilol 6.25 mg BID, lisinopril 20 mg daily Current hyperlipidemia medications include: rosuvastatin 10 mg daily (not taking)  Patient reports adherence to taking all medications as prescribed.   Insurance coverage: [employer]  Patient denies hypoglycemic events.  Patient denies nocturia (nighttime urination).    O:   Review of Systems  All other systems reviewed and are negative.   Physical Exam Constitutional:      Appearance: Normal appearance.  Pulmonary:     Effort: Pulmonary effort is normal.  Neurological:     Mental Status: He is alert.  Psychiatric:        Mood and Affect: Mood normal.        Thought Content: Thought content normal.        Judgment: Judgment normal.      Lab Results  Component Value Date   HGBA1C 6.5 09/27/2022    Vitals:   09/27/22 1134  BP: (!) 140/84  Pulse: 97  SpO2: 100%     Lipid Panel     Component Value Date/Time   CHOL 138 05/06/2020 1147   TRIG 101 05/06/2020 1147   HDL 62 05/06/2020 1147   CHOLHDL 2.2 05/06/2020 1147   CHOLHDL 3.6 05/08/2016 1521   VLDL 23 05/08/2016 1521   LDLCALC 58 05/06/2020 1147    Clinical Atherosclerotic  Cardiovascular Disease (ASCVD): No  The 10-year ASCVD risk score (Arnett DK, et al., 2019) is: 24.7%   Values used to calculate the score:     Age: 84 years     Sex: Male     Is Non-Hispanic African American: Yes     Diabetic: Yes     Tobacco smoker: No     Systolic Blood Pressure: 140 mmHg     Is BP treated: Yes     HDL Cholesterol: 62 mg/dL     Total Cholesterol: 138 mg/dL     A/P:  LIBERATE Study:  - 8 sensors provided for a 3 month supply. Educated to contact the office if the sensor falls off early and replacements are needed before their next [employer].  Diabetes longstanding duration of ` 12 years currently improved with 3 month A1C value reduced from 8.1 to 6.5 Patient is able to verbalize appropriate hypoglycemia management plan. Medication adherence appears good.  -Continued GIP/GLP-1 Mounjaro (tirzepatide) at recently increased dose of 7.5mg  weekly.  - Consider  SGLT2-I at future visit for CV and Kidney benefits. -Discontinued metformin due to patient preference and lack of use since starting [facility].   -Patient educated on purpose, proper use, and potential adverse effects.  -Extensively discussed pathophysiology of diabetes, recommended lifestyle interventions, dietary effects on blood sugar control.  -Counseled on s/sx of and management of hypoglycemia.  -Next A1c anticipated 3 months  at end of [facility].  -Completed DDS   Written patient instructions provided. Patient verbalized understanding of treatment plan.  Total time in face to face counseling 23 minutes.    Follow-up:  Pharmacist 3 months (end of Liberate study) Patient seen with Lily Peer, PharmD, PGY-1 resident.   No charge as this was a research/ Liberate Study visit.

## 2022-09-27 NOTE — Patient Instructions (Addendum)
It was nice to see you today!  Your goal blood sugar is 80-130 before eating and less than 180 after eating.  Medication Changes: Continue Mounjaro 7.5 mg weekly  Discontinue metformin   Monitor blood sugars at home and keep a log (glucometer or piece of paper) to bring with you to your next visit.  Keep up the good work with diet and exercise. Aim for a diet full of vegetables, fruit and lean meats (chicken, [place], fish). Try to limit salt intake by eating fresh or frozen vegetables (instead of canned), rinse canned vegetables prior to cooking and do not add any additional salt to meals.    Please schedule follow up with the pharmacy team (Dr. Paulino Rily) in 2 months

## 2022-09-27 NOTE — Assessment & Plan Note (Signed)
LIBERATE Study:  - 8 sensors provided for a 3 month supply. Educated to contact the office if the sensor falls off early and replacements are needed before their next [employer].  Diabetes longstanding duration of ` 12 years currently improved with 3 month A1C value reduced from 8.1 to 6.5 Patient is able to verbalize appropriate hypoglycemia management plan. Medication adherence appears good.  -Continued GIP/GLP-1 Mounjaro (tirzepatide) at recently increased dose of 7.5mg  weekly.  -Consider SGLT2-I at future visit for CV and Kidney benefits. -Discontinued metformin due to patient preference and lack of use since starting Mounjaro.   -Patient educated on purpose, proper use, and potential adverse effects.  -Extensively discussed pathophysiology of diabetes, recommended lifestyle interventions, dietary effects on blood sugar control.  -Counseled on s/sx of and management of hypoglycemia.  -Next A1c anticipated 3 months at end of Liberate Study.

## 2022-09-29 ENCOUNTER — Ambulatory Visit: Payer: Medicare HMO | Admitting: Pharmacist

## 2022-09-29 NOTE — Progress Notes (Signed)
Reviewed and agree with Dr Koval's plan.   

## 2022-11-28 ENCOUNTER — Encounter: Payer: Self-pay | Admitting: Pharmacist

## 2022-11-28 ENCOUNTER — Ambulatory Visit (INDEPENDENT_AMBULATORY_CARE_PROVIDER_SITE_OTHER): Payer: Medicare HMO | Admitting: Student

## 2022-11-28 ENCOUNTER — Ambulatory Visit (INDEPENDENT_AMBULATORY_CARE_PROVIDER_SITE_OTHER): Payer: Medicare HMO | Admitting: Pharmacist

## 2022-11-28 ENCOUNTER — Ambulatory Visit
Admission: RE | Admit: 2022-11-28 | Discharge: 2022-11-28 | Disposition: A | Discharge status: Home / Self Care | Payer: Medicare HMO | Source: Ambulatory Visit | Attending: Family Medicine | Admitting: Family Medicine

## 2022-11-28 ENCOUNTER — Other Ambulatory Visit: Payer: Self-pay | Admitting: Student

## 2022-11-28 VITALS — BP 109/71 | HR 100 | Ht 68.75 in | Wt 250.0 lb

## 2022-11-28 DIAGNOSIS — M545 Low back pain, unspecified: Secondary | ICD-10-CM

## 2022-11-28 DIAGNOSIS — I152 Hypertension secondary to endocrine disorders: Secondary | ICD-10-CM

## 2022-11-28 DIAGNOSIS — E119 Type 2 diabetes mellitus without complications: Secondary | ICD-10-CM | POA: Diagnosis not present

## 2022-11-28 DIAGNOSIS — G894 Chronic pain syndrome: Secondary | ICD-10-CM

## 2022-11-28 DIAGNOSIS — M25519 Pain in unspecified shoulder: Secondary | ICD-10-CM

## 2022-11-28 DIAGNOSIS — M542 Cervicalgia: Secondary | ICD-10-CM

## 2022-11-28 DIAGNOSIS — E1159 Type 2 diabetes mellitus with other circulatory complications: Secondary | ICD-10-CM | POA: Diagnosis not present

## 2022-11-28 MED ORDER — AMLODIPINE BESYLATE 2.5 MG PO TABS
2.5000 mg | ORAL_TABLET | Freq: Every day | ORAL | 2 refills | Status: DC
Start: 2022-11-28 — End: 2023-03-02

## 2022-11-28 MED ORDER — LIDOCAINE 5 % EX PTCH
1.0000 | MEDICATED_PATCH | CUTANEOUS | 0 refills | Status: DC
Start: 2022-11-28 — End: 2022-12-05

## 2022-11-28 MED ORDER — OLMESARTAN MEDOXOMIL-HCTZ 40-25 MG PO TABS
1.0000 | ORAL_TABLET | Freq: Every day | ORAL | 2 refills | Status: DC
Start: 2022-11-28 — End: 2023-02-26

## 2022-11-28 NOTE — Progress Notes (Signed)
    SUBJECTIVE:   CHIEF COMPLAINT / HPI:   Cameron Diaz is a 62 y.o. male  presenting for acute back pain. He reports falling onto the right side of his lower back when he was getting up from his chair and lost his balance last Thursday. Since then he has tried a Chief Strategy Officer significant relief.  He reports that his pain did seem to improve temporarily but then worsened again this morning.  He denies sedentary lifestyle and has been active since the injury.  The pain does not radiate and stays concentrated at the point of impact.  He does not have any difficulty breathing or chest pain.   PERTINENT  PMH / PSH: Reviewed and updated   OBJECTIVE:   Wt Readings from Last 3 Encounters:  11/28/22 250 lb (113.4 kg)  09/27/22 243 lb 6.4 oz (110.4 kg)  08/04/22 242 lb 8 oz (110 kg)   Temp Readings from Last 3 Encounters:  08/04/22 97.9 F (36.6 C) (Oral)  08/03/22 98.1 F (36.7 C) (Oral)  06/21/21 98.2 F (36.8 C)   BP Readings from Last 3 Encounters:  11/28/22 109/71  09/27/22 (!) 140/84  08/04/22 (!) 135/97   Pulse Readings from Last 3 Encounters:  11/28/22 100  09/27/22 97  08/04/22 100    Well-appearing, no acute distress Cardio: Regular rate, regular rhythm, no murmurs on exam. Pulm: Clear, no wheezing, no crackles. No increased work of breathing Abdominal: bowel sounds present, soft, non-tender, non-distended Extremities: no peripheral edema  Neuro: alert and oriented x3, speech normal in content, no facial asymmetry, strength intact and equal bilaterally in UE and LE, pupils equal and reactive to light.  MSK: 4 x 3 cm bruising located on right flank, tenderness to palpation over bruise, no tenderness to palpation over bony sites, peripheral pulses intact     08/04/2022    2:36 PM 02/03/2022    3:13 PM 01/16/2022    1:39 PM  PHQ9 SCORE ONLY  PHQ-9 Total Score 0 5 1      ASSESSMENT/PLAN:   No problem-specific Assessment & Plan notes found for this  encounter.  Acute right-sided back pain: Exam consistent with possible muscle strain or injury.  Shared decision making with patient to pursue imaging of his lumbar spine and right sided ribs.  Discussed pain management options with heating pad, lidocaine patch, Voltaren gel.  Discussed that back pain can take 6 to 8 weeks to resolve acutely.  If he is not improved and his x-rays are normal consider physical therapy referral.   Glendale Chard, DO [facility]
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
## 2022-11-28 NOTE — Assessment & Plan Note (Signed)
Diabetes, diagnosed in 2012, currently well controlled with most recent CGM report showing GMI of 6.7% and an average glucose of 143 mg/dL. Patient is able to verbalize appropriate hypoglycemia management plan. Medication adherence appears optimal.  - Continued Mounjaro (tirzepatide) 7.5 mg once weekly. - Continued use of continuous glucose monitoring Libre 3 sensor. - Patient educated on purpose, proper use, and potential adverse effects. -Extensively discussed pathophysiology of diabetes, recommended lifestyle interventions, dietary effects on blood sugar control.  -Counseled on s/sx of and management of hypoglycemia.

## 2022-11-28 NOTE — Patient Instructions (Addendum)
It was great to see you today!   I am ordering x-rays of your lower back and ribs. You can go to [facility] on [address] at any time to get these x-rays completed.  You do not need an appointment.  For pain control:  - lidocaine patch  - voltaren gel  - heating pad  - avoid prolonged sitting or laying down  Future Appointments  Date Time Provider Department Center  12/19/2022 11:20 AM Pollyann Savoy, MD [facility] None    Please arrive 15 minutes before your appointment to ensure smooth check in process.    Please call the clinic at [phone] if your symptoms worsen or you have any concerns.  Thank you for allowing me to participate in your care, Dr. Glendale Chard [facility]

## 2022-11-28 NOTE — Patient Instructions (Addendum)
It was nice to see you today!  Your goal blood sugar is 80-130 before eating and less than 180 after eating.  Bring all your medications to your next visit.  Medication Changes: START olmesartan-hydrochlorothiazide 40-25 mg once daily. START amlodipine 2.5 mg once daily.  Discontinue lisinopril 20 mg once daily. Discontinue amlodipine 5 mg once daily. Discontinue torsemide 5 mg once daily.  Continue all other medication the same.   Keep up the good work with diet and exercise. Aim for a diet full of vegetables, fruit and lean meats (chicken, [place], fish). Try to limit salt intake by eating fresh or frozen vegetables (instead of canned), rinse canned vegetables prior to cooking and do not add any additional salt to meals.

## 2022-11-28 NOTE — Assessment & Plan Note (Signed)
Hypertension, diagnosed in 2016, currently controlled with in office reading of 109/71 mm Hg . Blood pressure goal of <130/80 mmHg. Medication adherence optimal.  - Patient reports doctor from [facility] instructed patient to hold Norvasc (amlodipine) 5 mg once daily and to start Demadex (torsemide) 5 mg once daily. - Suspecting swelling in patient's extremities is associated with amlodipine. - Discontinued Demadex (torsemide) 5 mg once daily. - Decreased Norvasc (amlodipine) from 5 mg to 2.5 mg once daily. - Discontinued Zestril (lisinopril) 20 mg once daily. - Initiated Benicar HCT (olmesartan/hydrochlorothiazide) 40-25 mg once daily.

## 2022-11-30 NOTE — Progress Notes (Signed)
Reviewed and agree with Dr Koval's plan.   

## 2022-12-05 ENCOUNTER — Telehealth: Payer: Self-pay | Admitting: Student

## 2022-12-05 NOTE — Telephone Encounter (Signed)
Called to discuss x-ray results with patient. DG lumbar and ribs were negative for acute fracture s/p fall. Patient reports his pain is improving with conservative therapy.   Glendale Chard, DO [facility], PGY-2 12/05/22 11:56 AM
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
## 2022-12-08 ENCOUNTER — Other Ambulatory Visit: Payer: Self-pay | Admitting: Student

## 2022-12-13 ENCOUNTER — Ambulatory Visit (INDEPENDENT_AMBULATORY_CARE_PROVIDER_SITE_OTHER): Payer: Medicare HMO | Admitting: Pharmacist

## 2022-12-13 ENCOUNTER — Encounter: Payer: Self-pay | Admitting: Pharmacist

## 2022-12-13 VITALS — BP 118/82 | Resp 112 | Wt 244.8 lb

## 2022-12-13 DIAGNOSIS — E119 Type 2 diabetes mellitus without complications: Secondary | ICD-10-CM

## 2022-12-13 DIAGNOSIS — E1159 Type 2 diabetes mellitus with other circulatory complications: Secondary | ICD-10-CM | POA: Diagnosis not present

## 2022-12-13 DIAGNOSIS — E1169 Type 2 diabetes mellitus with other specified complication: Secondary | ICD-10-CM | POA: Diagnosis not present

## 2022-12-13 DIAGNOSIS — E785 Hyperlipidemia, unspecified: Secondary | ICD-10-CM

## 2022-12-13 DIAGNOSIS — I152 Hypertension secondary to endocrine disorders: Secondary | ICD-10-CM

## 2022-12-13 MED ORDER — CARVEDILOL 12.5 MG PO TABS
12.5000 mg | ORAL_TABLET | Freq: Two times a day (BID) | ORAL | 3 refills | Status: DC
Start: 2022-12-13 — End: 2023-04-24

## 2022-12-13 NOTE — Assessment & Plan Note (Signed)
Hypertension longstanding and improved control with lower dose amlodipine in combination with ARB/Thiazide.  Blood pressure goal of <130/80  mmHg. Medication adherence appears good. Heart rate remains elevated ~ 100 and blood pressure control is suboptimal at this time.  -Increased dose of Carvedilol from .25mg  BID to 12.5mg  BID.

## 2022-12-13 NOTE — Assessment & Plan Note (Signed)
Diabetes longstanding with currently much improved glucose control based on CGM data. . Patient is able to verbalize appropriate hypoglycemia management plan. Medication adherence appears good.  -Continued GLP-1  Mounjaro (tirzepatide) at 7.5mg  weekly. .  -Patient educated on purpose, proper use, and potential adverse effects.  -Extensively discussed pathophysiology of diabetes, recommended lifestyle interventions, dietary effects on blood sugar control.  -Counseled on s/sx of and management of hypoglycemia.

## 2022-12-13 NOTE — Assessment & Plan Note (Signed)
ASCVD risk - primary prevention in patient with diabetes. Patient repors unwillingness to use cholesterol lowering medication.  -Plan to readdress at future visit.

## 2022-12-13 NOTE — Progress Notes (Signed)
S:     No chief complaint on file.  62 y.o. male who presents for diabetes evaluation, education, and management. Patient arrives in fair spirits and presents without any assistance.  Patient was referred and last seen by Primary Care Provider, Dr. Barbaraann Faster on 09/24/22.    PMH is significant for diabetes, cirrhosis, hypertension, hyperlipidemia, depression, substance use disorder.  At last visit, continued Mounjaro (tirzepatide) at  dose of  7.5 mg once weekly.    Patient reports Diabetes was diagnosed in 2012.    Current diabetes medications include: Mounjaro (tirzepatide) 7.5 mg once weekly  Current hypertension medications include: Norvasc (amlodipine) 5 mg once daily, Coreg (carvedilol) 6.25 mg twice daily, Zestril (lisinopril) 20 mg once daily  Current hyperlipidemia medications include: Crestor (rosuvastatin) 10 mg once daily (not taking)   Patient reports adherence to taking all medications as prescribed.    Insurance coverage: [employer] Medicare   Patient denies hypoglycemic events.   Patient continues to report increased stress since his father passed away in Nov 04, 2022. Patient also reports his back pain is resolved.     O:   Review of Systems  Musculoskeletal:  Positive for back pain and joint pain.  Psychiatric/Behavioral:  Positive for depression.     Physical Exam Pulmonary:     Effort: Pulmonary effort is normal.  Musculoskeletal:     Right lower leg: No edema.     Left lower leg: No edema.  Psychiatric:        Behavior: Behavior normal.        Judgment: Judgment normal.      Libre3 CGM Download today 12/13/2022  % Time CGM is active: 94% Average Glucose: 150 mg/dL Glucose Management Indicator: 6.9  Glucose Variability: 21.7% (goal <36%) Time in Goal:  - Time in range 70-180: 84% - Time above range: 16% - Time below range: 0% Observed patterns:   Lab Results  Component Value Date   HGBA1C 6.5 09/27/2022   Vitals:   12/13/22 1145  BP: 118/82  Resp:  (!) 112  SpO2: 99%    Lipid Panel     Component Value Date/Time   CHOL 138 05/06/2020 1147   TRIG 101 05/06/2020 1147   HDL 62 05/06/2020 1147   CHOLHDL 2.2 05/06/2020 1147   CHOLHDL 3.6 05/08/2016 1521   VLDL 23 05/08/2016 1521   LDLCALC 58 05/06/2020 1147    Clinical Atherosclerotic Cardiovascular Disease (ASCVD): No  The 10-year ASCVD risk score (Arnett DK, et al., 2019) is: 19.2%   Values used to calculate the score:     Age: 16 years     Sex: Male     Is Non-Hispanic African American: Yes     Diabetic: Yes     Tobacco smoker: No     Systolic Blood Pressure: 118 mmHg     Is BP treated: Yes     HDL Cholesterol: 62 mg/dL     Total Cholesterol: 138 mg/dL    A/P: Diabetes longstanding with currently much improved glucose control based on CGM data. . Patient is able to verbalize appropriate hypoglycemia management plan. Medication adherence appears good.  -Continued GLP-1  Mounjaro (tirzepatide) at 7.5mg  weekly. .  -Patient educated on purpose, proper use, and potential adverse effects.  -Extensively discussed pathophysiology of diabetes, recommended lifestyle interventions, dietary effects on blood sugar control.  -Counseled on s/sx of and management of hypoglycemia.    ASCVD risk - primary prevention in patient with diabetes. Patient repors unwillingness to use cholesterol lowering  medication.  -Plan to readdress at future visit.   Hypertension longstanding and improved control with lower dose amlodipine in combination with ARB/Thiazide.  Blood pressure goal of <130/80  mmHg. Medication adherence appears good. Heart rate remains elevated ~ 100 and blood pressure control is suboptimal at this time.  -Increased dose of Carvedilol from .25mg  BID to 12.5mg  BID.   Written patient instructions provided. Patient verbalized understanding of treatment plan.  Total time in face to face counseling 33 minutes.    Follow-up:  Pharmacist 3 weeks for end of Liberate study 6 month  visit.  PCP clinic visit in PRN Patient seen with Alesia Banda, PharmD Candidate and Andee Poles, PharmD Candidate.

## 2022-12-13 NOTE — Patient Instructions (Signed)
It was nice to see you today!  Your goal blood pressure is <130/80 mmHg.  Medication Changes: STOP Carvedilol 6.25 mg  START Carvedilol 12.5 mg - Take 2 tablets by mouth twice daily  Continue all other medication the same.   Monitor blood pressure at home daily and keep a log (on your phone or piece of paper) to bring with you to your next visit. Write down date, time, blood pressure and pulse.  Keep up the good work with diet and exercise. Aim for a diet full of vegetables, fruit and lean meats (chicken, [place], fish). Try to limit salt intake by eating fresh or frozen vegetables (instead of canned), rinse canned vegetables prior to cooking and do not add any additional salt to meals.

## 2022-12-14 ENCOUNTER — Ambulatory Visit: Payer: Medicare HMO | Admitting: Student

## 2022-12-14 ENCOUNTER — Ambulatory Visit: Payer: Medicare HMO | Admitting: Pharmacist

## 2022-12-14 NOTE — Progress Notes (Signed)
Reviewed and agree with Dr Koval's plan.   

## 2022-12-19 ENCOUNTER — Ambulatory Visit: Payer: Medicare HMO | Admitting: Rheumatology

## 2022-12-19 DIAGNOSIS — H16009 Unspecified corneal ulcer, unspecified eye: Secondary | ICD-10-CM

## 2022-12-19 DIAGNOSIS — M170 Bilateral primary osteoarthritis of knee: Secondary | ICD-10-CM

## 2022-12-19 DIAGNOSIS — M19041 Primary osteoarthritis, right hand: Secondary | ICD-10-CM

## 2022-12-19 DIAGNOSIS — I10 Essential (primary) hypertension: Secondary | ICD-10-CM

## 2022-12-19 DIAGNOSIS — Z86718 Personal history of other venous thrombosis and embolism: Secondary | ICD-10-CM

## 2022-12-19 DIAGNOSIS — Z800 Family history of malignant neoplasm of digestive organs: Secondary | ICD-10-CM

## 2022-12-19 DIAGNOSIS — M5136 Other intervertebral disc degeneration, lumbar region: Secondary | ICD-10-CM

## 2022-12-19 DIAGNOSIS — M160 Bilateral primary osteoarthritis of hip: Secondary | ICD-10-CM

## 2022-12-19 DIAGNOSIS — Z86010 Personal history of colonic polyps: Secondary | ICD-10-CM

## 2022-12-19 DIAGNOSIS — F1911 Other psychoactive substance abuse, in remission: Secondary | ICD-10-CM

## 2022-12-19 DIAGNOSIS — E119 Type 2 diabetes mellitus without complications: Secondary | ICD-10-CM

## 2022-12-19 DIAGNOSIS — M5030 Other cervical disc degeneration, unspecified cervical region: Secondary | ICD-10-CM

## 2022-12-19 DIAGNOSIS — F4321 Adjustment disorder with depressed mood: Secondary | ICD-10-CM

## 2022-12-19 DIAGNOSIS — F1011 Alcohol abuse, in remission: Secondary | ICD-10-CM

## 2022-12-19 DIAGNOSIS — Z8639 Personal history of other endocrine, nutritional and metabolic disease: Secondary | ICD-10-CM

## 2022-12-19 DIAGNOSIS — B1920 Unspecified viral hepatitis C without hepatic coma: Secondary | ICD-10-CM

## 2022-12-19 DIAGNOSIS — L409 Psoriasis, unspecified: Secondary | ICD-10-CM

## 2022-12-19 DIAGNOSIS — G4709 Other insomnia: Secondary | ICD-10-CM

## 2022-12-22 ENCOUNTER — Other Ambulatory Visit: Payer: Self-pay | Admitting: Student

## 2022-12-22 DIAGNOSIS — M545 Low back pain, unspecified: Secondary | ICD-10-CM

## 2023-01-02 ENCOUNTER — Encounter: Payer: Medicare HMO | Admitting: Pharmacist

## 2023-01-02 ENCOUNTER — Encounter: Payer: Self-pay | Admitting: Pharmacist

## 2023-01-02 VITALS — BP 116/62 | Wt 245.0 lb

## 2023-01-02 DIAGNOSIS — E1159 Type 2 diabetes mellitus with other circulatory complications: Secondary | ICD-10-CM

## 2023-01-02 DIAGNOSIS — E119 Type 2 diabetes mellitus without complications: Secondary | ICD-10-CM

## 2023-01-02 DIAGNOSIS — Z7985 Long-term (current) use of injectable non-insulin antidiabetic drugs: Secondary | ICD-10-CM

## 2023-01-02 DIAGNOSIS — E785 Hyperlipidemia, unspecified: Secondary | ICD-10-CM

## 2023-01-02 DIAGNOSIS — E1169 Type 2 diabetes mellitus with other specified complication: Secondary | ICD-10-CM

## 2023-01-02 DIAGNOSIS — I152 Hypertension secondary to endocrine disorders: Secondary | ICD-10-CM

## 2023-01-02 LAB — POCT GLYCOSYLATED HEMOGLOBIN (HGB A1C): HbA1c, POC (controlled diabetic range): 6.6 % (ref 0.0–7.0)

## 2023-01-02 MED ORDER — ROSUVASTATIN CALCIUM 10 MG PO TABS: 10.0000 mg | ORAL_TABLET | Freq: Every day | ORAL | 3 refills | Status: DC

## 2023-01-02 MED ORDER — FREESTYLE LIBRE SENSOR SYSTEM MISC: 11 refills | Status: DC

## 2023-01-02 MED ORDER — MOUNJARO 10 MG/0.5ML ~~LOC~~ SOAJ: 10.0000 mg | SUBCUTANEOUS | 6 refills | Status: DC

## 2023-01-02 NOTE — Assessment & Plan Note (Signed)
Diabetes longstanding currently controlled with latest GMI of 6.9% and A1c today of 6.6! Patient is able to verbalize appropriate hypoglycemia management plan. Medication adherence appears good.  - Increased Mounjaro (tirzepitide) to 10 mg once weekly for weight loss and satiety benefit.  -Patient educated on purpose, proper use, and potential adverse effects.  -Extensively discussed pathophysiology of diabetes, recommended lifestyle interventions, dietary effects on blood sugar control.

## 2023-01-02 NOTE — Patient Instructions (Signed)
It was nice to see you today!  It has been a pleasure working with you   Your goal blood sugar is 80-130 before eating and less than 180 after eating.  Medication Changes: START Rosuvastatin once daily   Increase Carvedilol to twice daily (remember to take at bedtime)  Continue all other medication the same.    Keep up the good work with diet and exercise. Aim for a diet full of vegetables, fruit and lean meats (chicken, [place], fish). Try to limit salt intake by eating fresh or frozen vegetables (instead of canned), rinse canned vegetables prior to cooking and do not add any additional salt to meals.

## 2023-01-02 NOTE — Assessment & Plan Note (Signed)
ASCVD risk - primary prevention in patient with diabetes. Last LDL is 58 at goal of <70 mg/dL. At last visit, patient reported unwillingness to use cholesterol lowering medication. Today, thoroughly discussed benefit of statin to prevent heart attacks and strokes. Patient reported willingness to restart medication. -Restarted Crestor (rosuvastatin) 10 mg once daily.

## 2023-01-02 NOTE — Assessment & Plan Note (Signed)
Hypertension longstanding currently controlled with in-office reading of 116/62 mm Hg. Blood pressure goal of <130/80 mmHg. Medication adherence appears suboptimal. Blood pressure control is suboptimal due to patient forgetting to take evening dose of carvedilol.  No leg swelling and well controlled with use of combination including amlodipine 2.5mg  daily.  - Continued carvedilol 12.5 mg twice daily. - Continued olmesartan-hydrochlorothiazide 40-25 mg once daily. - Continued amlodipine 2.5 mg once daily.

## 2023-01-02 NOTE — Research (Signed)
S:     Chief Complaint  Patient presents with   Medication Management    Liberate CGM study - 6 month visit   62 y.o. male who presents for diabetes evaluation, education, and management in the context of the Liberate Study. Patient arrives in good spirits and presents without any assistance. Patient reports experiencing dizziness today and flu-like symptoms after receiving flu and COVID-19 vaccinations last Friday.  He states that his symptoms have been gradually improving.   Patient was referred and last seen by Primary Care Provider, Dr. Hyacinth Meeker, on 11/28/22.   PMH is significant for diabetes, cirrhosis, hypertension, hyperlipidemia, depression, substance use disorder.  At last visit, increased dose of carvedilol from 6.25 mg BID to 12.5 mg BID.  Patient reports Diabetes was diagnosed in 2012.   Current diabetes medications include: Mounjaro (tirzepatide) 7.5 mg once weekly  Current hypertension medications include: amlodipine 2.5 mg once daily, carvedilol 12.5 mg twice daily, olmesartan-hydrochlorothiazide 40-25 mg once daily Current hyperlipidemia medications include: Crestor (rosuvastatin) 10 mg once daily (patient reports not taking)  Patient reports adherence to taking all medications as prescribed. Patient does report missing evening doses of carvedilol.  Insurance coverage: [employer]   Patient denies hypoglycemic events.  O:   Review of Systems  Cardiovascular:  Negative for leg swelling.  All other systems reviewed and are negative.   Physical Exam Constitutional:      Appearance: Normal appearance.  Pulmonary:     Effort: Pulmonary effort is normal.  Musculoskeletal:     Right lower leg: No edema.     Left lower leg: No edema.  Neurological:     Mental Status: He is alert.  Psychiatric:        Mood and Affect: Mood normal.        Behavior: Behavior normal.        Thought Content: Thought content normal.        Judgment: Judgment normal.    7  day average blood glucose: 151 mg/dL  Libre3 CGM Download today on 01/02/23 % Time CGM is active: 87% Average Glucose: 151 mg/dL Glucose Management Indicator: 6.9%  Glucose Variability: 18.6% (goal <36%) Time in Goal:  - Time in range 70-180: 86% - Time above range: 14% - Time below range: 0%   Lab Results  Component Value Date   HGBA1C 6.6 01/02/2023   Vitals:   01/02/23 1136  BP: 116/62    Lipid Panel     Component Value Date/Time   CHOL 138 05/06/2020 1147   TRIG 101 05/06/2020 1147   HDL 62 05/06/2020 1147   CHOLHDL 2.2 05/06/2020 1147   CHOLHDL 3.6 05/08/2016 1521   VLDL 23 05/08/2016 1521   LDLCALC 58 05/06/2020 1147    Clinical Atherosclerotic Cardiovascular Disease (ASCVD): No  The 10-year ASCVD risk score (Arnett DK, et al., 2019) is: 18.6%   Values used to calculate the score:     Age: 71 years     Sex: Male     Is Non-Hispanic African American: Yes     Diabetic: Yes     Tobacco smoker: No     Systolic Blood Pressure: 116 mmHg     Is BP treated: Yes     HDL Cholesterol: 62 mg/dL     Total Cholesterol: 138 mg/dL   Liberate Study: Patient is interested in continuing use of Libre sensors. - Sent prescription for [place] 3 sensors to patient's pharmacy.  A/P: Diabetes longstanding currently controlled with latest GMI  of 6.9% and A1c today of 6.6! Patient is able to verbalize appropriate hypoglycemia management plan. Medication adherence appears good.  - Increased Mounjaro (tirzepitide) to 10 mg once weekly for weight loss and satiety benefit.  -Patient educated on purpose, proper use, and potential adverse effects.  -Extensively discussed pathophysiology of diabetes, recommended lifestyle interventions, dietary effects on blood sugar control.  -Counseled on s/sx of and management of hypoglycemia.   ASCVD risk - primary prevention in patient with diabetes. Last LDL is 58 at goal of <70 mg/dL. At last visit, patient reported unwillingness to use cholesterol  lowering medication. Today, thoroughly discussed benefit of statin to prevent heart attacks and strokes. Patient reported willingness to restart medication. -Restarted Crestor (rosuvastatin) 10 mg once daily.  Hypertension longstanding currently controlled with in-office reading of 116/62 mm Hg. Blood pressure goal of <130/80 mmHg. Medication adherence appears suboptimal. Blood pressure control is suboptimal due to patient forgetting to take evening dose of carvedilol.  No leg swelling and well controlled with use of combination including amlodipine 2.5mg  daily.  - Continued carvedilol 12.5 mg twice daily. - Continued olmesartan-hydrochlorothiazide 40-25 mg once daily. - Continued amlodipine 2.5 mg once daily.  Written patient instructions provided. Patient verbalized understanding of treatment plan.  Total time in face to face counseling 29 minutes.    Follow-up:  Pharmacist PRN PCP clinic visit on 01/31/23 Patient seen with Andee Poles, PharmD Candidate.

## 2023-01-31 ENCOUNTER — Ambulatory Visit: Payer: Medicare HMO | Admitting: Student
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
## 2023-02-20 ENCOUNTER — Telehealth (HOSPITAL_COMMUNITY): Payer: Self-pay | Admitting: *Deleted

## 2023-02-20 ENCOUNTER — Ambulatory Visit (HOSPITAL_COMMUNITY)
Admission: EM | Admit: 2023-02-20 | Discharge: 2023-02-20 | Disposition: A | Discharge status: Home / Self Care | Payer: Medicare HMO | Attending: Family Medicine | Admitting: Family Medicine

## 2023-02-20 ENCOUNTER — Encounter (HOSPITAL_COMMUNITY): Payer: Self-pay | Admitting: Emergency Medicine

## 2023-02-20 DIAGNOSIS — J101 Influenza due to other identified influenza virus with other respiratory manifestations: Secondary | ICD-10-CM

## 2023-02-20 LAB — POC COVID19/FLU A&B COMBO
Covid Antigen, POC: NEGATIVE
Influenza A Antigen, POC: POSITIVE — AB
Influenza B Antigen, POC: NEGATIVE

## 2023-02-20 MED ORDER — BENZONATATE 100 MG PO CAPS: ORAL_CAPSULE | ORAL | 0 refills | Status: DC

## 2023-02-20 MED ORDER — OSELTAMIVIR PHOSPHATE 75 MG PO CAPS: 75.0000 mg | ORAL_CAPSULE | Freq: Two times a day (BID) | ORAL | 0 refills | Status: AC

## 2023-02-20 MED ORDER — OSELTAMIVIR PHOSPHATE 75 MG PO CAPS: 75.0000 mg | ORAL_CAPSULE | Freq: Two times a day (BID) | ORAL | 0 refills | Status: DC

## 2023-02-20 NOTE — Discharge Instructions (Signed)
Follow up with your primary care doctor or here if you are not seeing improvement of your symptoms over the next several days, sooner if you feel you are worsening. ° °Caring for yourself: °Get plenty of rest. °Drink plenty of fluids, enough so that your urine is light yellow or clear like water. If you have kidney, heart, or liver disease and have to limit fluids, talk with your doctor before you increase the amount of fluids you drink. °Take an over-the-counter pain medicine if needed, such as acetaminophen (Tylenol), ibuprofen (Advil, Motrin), or naproxen (Aleve), to relieve fever, headache, and muscle aches. Read and follow all instructions on the label. No one younger than 20 should take aspirin. It has been linked to Reye syndrome, a serious illness. °Before you use over the counter cough and cold medicines, check the label. These medicines may not be safe for children younger than age 6 or for people with certain health problems. °If the skin around your nose and lips becomes sore, put some petroleum jelly on the area. ° °Avoid spreading the flu: °Wash your hands regularly, and keep your hands away from your face.  °Stay home from school, work, and other public places until you are feeling better and your fever has been gone for at least 24 hours. The fever needs to have gone away on its own without the help of medicine. °

## 2023-02-20 NOTE — ED Triage Notes (Signed)
Pt c/o cough, sore throat, headache, chillls, and fever that started yesterday.   Pt took hydrocodone a about 30 minutes ago.
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
## 2023-02-24 ENCOUNTER — Other Ambulatory Visit: Payer: Self-pay | Admitting: Family Medicine

## 2023-02-24 DIAGNOSIS — I152 Hypertension secondary to endocrine disorders: Secondary | ICD-10-CM

## 2023-03-02 ENCOUNTER — Other Ambulatory Visit: Payer: Self-pay | Admitting: Family Medicine

## 2023-03-02 DIAGNOSIS — I152 Hypertension secondary to endocrine disorders: Secondary | ICD-10-CM

## 2023-03-13 ENCOUNTER — Ambulatory Visit (INDEPENDENT_AMBULATORY_CARE_PROVIDER_SITE_OTHER): Payer: Medicare HMO | Admitting: Family Medicine

## 2023-03-13 ENCOUNTER — Encounter: Payer: Self-pay | Admitting: Family Medicine

## 2023-03-13 VITALS — BP 117/75 | HR 103 | Ht 70.0 in | Wt 247.2 lb

## 2023-03-13 DIAGNOSIS — E119 Type 2 diabetes mellitus without complications: Secondary | ICD-10-CM

## 2023-03-13 DIAGNOSIS — E1169 Type 2 diabetes mellitus with other specified complication: Secondary | ICD-10-CM | POA: Diagnosis not present

## 2023-03-13 DIAGNOSIS — G894 Chronic pain syndrome: Secondary | ICD-10-CM

## 2023-03-13 DIAGNOSIS — E785 Hyperlipidemia, unspecified: Secondary | ICD-10-CM

## 2023-03-13 DIAGNOSIS — I152 Hypertension secondary to endocrine disorders: Secondary | ICD-10-CM

## 2023-03-13 DIAGNOSIS — E1159 Type 2 diabetes mellitus with other circulatory complications: Secondary | ICD-10-CM

## 2023-03-13 DIAGNOSIS — F39 Unspecified mood [affective] disorder: Secondary | ICD-10-CM

## 2023-03-13 MED ORDER — LIDOCAINE 5 % EX PTCH: 1.0000 | MEDICATED_PATCH | CUTANEOUS | 0 refills | Status: DC

## 2023-03-13 MED ORDER — DICLOFENAC SODIUM 1 % EX GEL: 4.0000 g | CUTANEOUS | 0 refills | Status: DC

## 2023-03-13 MED ORDER — ESCITALOPRAM OXALATE 10 MG PO TABS: 10.0000 mg | ORAL_TABLET | Freq: Every day | ORAL | 2 refills | Status: DC

## 2023-03-13 NOTE — Assessment & Plan Note (Signed)
Pt on Crestor 10 mg daily. No myalgias. Will check lipids at next OV - Upcoming lipid panel

## 2023-03-13 NOTE — Assessment & Plan Note (Signed)
Refill provided for Voltaren gel and lidocaine patches.

## 2023-03-13 NOTE — Progress Notes (Cosign Needed)
SUBJECTIVE:   CHIEF COMPLAINT / HPI:   Cameron Diaz is a 62 y.o. male who presents today for medication check.  T2DM Pt's diabetes is managed with medications including Mounjaro 10 mg once a week. Last Ha1c on 01/02/23 was well controlled at 6.6%. Repeat Ha1c upcoming later in 03/2023 with pain doctor's clinic.  HTN BP is managed with medications including amlodipine 2.5 mg daily, carvedilol 12.5 mg BID, olmesartan 40 mg daily. Was prescribed olmesartan-hydrochlorothiazide 40-25 mg daily, but is not taking the hydrochlorothiazide portion. Pt's BP today is 117/75.  Reports a different doctor (from [facility]?) told him to take Metoprolol, instead of Carvedilol. However, he is still taking Carvedilol. Other doctor also gave him Torsemide for BLE edema. Reports light headedness over the past 2 months a couple times a week, no syncope. Alleviates feeling by sitting and resting. Does not think increased light headedness with suddenly standing from seated position. No palpitations. Reports he is staying hydrated and is drinking around 6 12 oz bottles a day.  HLD Pt's HLD is managed with medications including Crestor 10 mg daily. Last lipid panel done 05/06/20 with LDL well controlled at 58, HDL 62. None since restarting statin. Denies myalgias.  Depression Wants refill of Lexapro, has not been taking this but feels like it would be helpful with mood.  PERTINENT  PMH / PSH: T2DM, HTN, HLD  OBJECTIVE:   BP 117/75   Pulse (!) 103   Ht 5\' 10"  (1.778 m)   Wt 247 lb 3.2 oz (112.1 kg)   SpO2 96%   BMI 35.47 kg/m   General: Pt is seated in chair, no acute distress. Cardiovascular: RRR, no murmurs, rubs, gallops. No BLE edema. Pulmonary: Normal work of breathing. Lungs clear to auscultation bilaterally. Neuro/Psych: Alert and oriented to person, place, event, time. Normal affect.  ASSESSMENT/PLAN:   Assessment & Plan Type 2 diabetes mellitus without complication, without long-term  current use of insulin (HCC) Pt well controlled on Mounjaro 10 mg weekly. Continue same. Hypertension associated with diabetes (HCC) BP well controlled today at 117/75. Some confusion over what medications patient is taking as another provider has also been adjusting BP medications. Believe pt is currently taking amlodipine 2.5 mg daily, carvedilol 12.5 mg BID, olmesartan 40 mg daily. Also taking torsemide 10 mg daily after experiencing leg swelling, now resolved. Has had some episodes of light headedness over the past 2 months. Suspect light headedness possibly related to orthostasis. Will discontinue torsemide and recheck BP in 1 week. - STOP torsemide and continue amlodipine, olmesartan, and coreg - Pt will return in 1 week for follow-up BP check and labs - Upcoming BMP - If pt experiences lower extremity edema again, can consider stopping amlodipine - If pt experiences increased BP, can consider changing olmesartan 40 mg daily to olmesartan-hydrochlorothiazide 40-25 mg daily Hyperlipidemia associated with type 2 diabetes mellitus (HCC) Pt on Crestor 10 mg daily. No myalgias. Will check lipids at next OV - Upcoming lipid panel Chronic pain syndrome Refill provided for Voltaren gel and lidocaine patches. Mood disorder (HCC) Pt requests Lexapro refill. PHQ9 Q9 was 0. Refill provided.  Governor Rooks, Medical Student [facility]  I was personally present and performed or re-performed the history, physical exam and medical decision making activities of this service and have verified that the service and findings are accurately documented in the student's note.  Janeal Holmes, MD  03/13/2023, 1:17 PM

## 2023-03-13 NOTE — Assessment & Plan Note (Signed)
Pt well controlled on Mounjaro 10 mg weekly. Continue same.

## 2023-03-13 NOTE — Patient Instructions (Addendum)
I will send in your medications. STOP torsemide and follow up in 1 week. Let me know if your lightheadedness gets worse before then. We will get labs next week when you come back.

## 2023-03-13 NOTE — Assessment & Plan Note (Addendum)
BP well controlled today at 117/75. Some confusion over what medications patient is taking as another provider has also been adjusting BP medications. Believe pt is currently taking amlodipine 2.5 mg daily, carvedilol 12.5 mg BID, olmesartan 40 mg daily. Also taking torsemide 10 mg daily after experiencing leg swelling, now resolved. Has had some episodes of light headedness over the past 2 months. Suspect light headedness possibly related to orthostasis. Will discontinue torsemide and recheck BP in 1 week. - STOP torsemide and continue amlodipine, olmesartan, and coreg - Pt will return in 1 week for follow-up BP check and labs - Upcoming BMP - If pt experiences lower extremity edema again, can consider stopping amlodipine - If pt experiences increased BP, can consider changing olmesartan 40 mg daily to olmesartan-hydrochlorothiazide 40-25 mg daily

## 2023-03-14 IMAGING — US US ABDOMEN LIMITED
1 series · 14 of 25 positions shown · non-contrast
Comparison: CT 10/17/2017

CLINICAL DATA: History of alcohol use and hepatic steatosis.

EXAM:
ULTRASOUND ABDOMEN LIMITED RIGHT UPPER QUADRANT

[Series 1: us abdomen limited · 0.17mm/px · 14 of 49 slices shown]
[im 1/49]
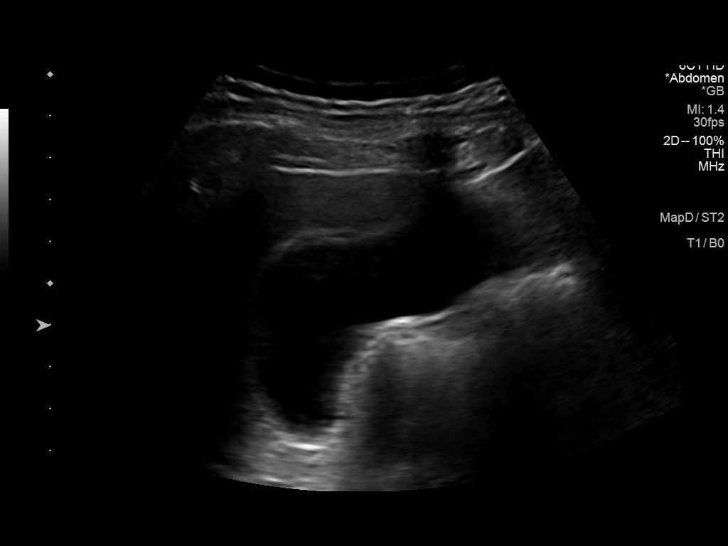
[im 5/49]
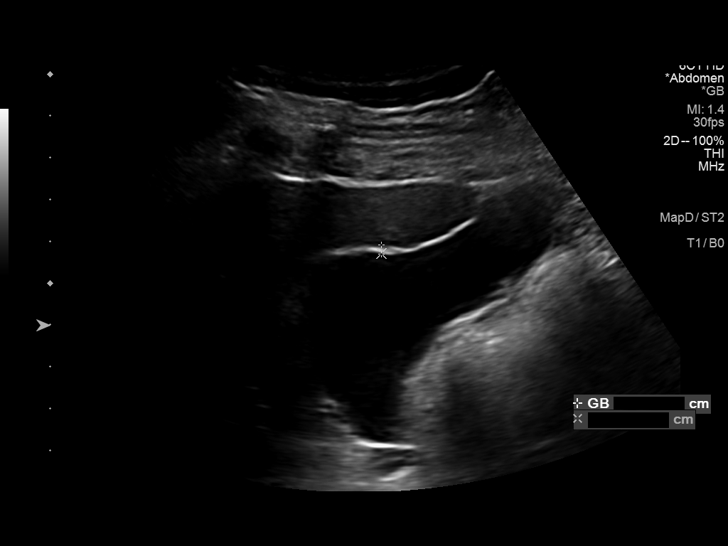
[im 9/49]
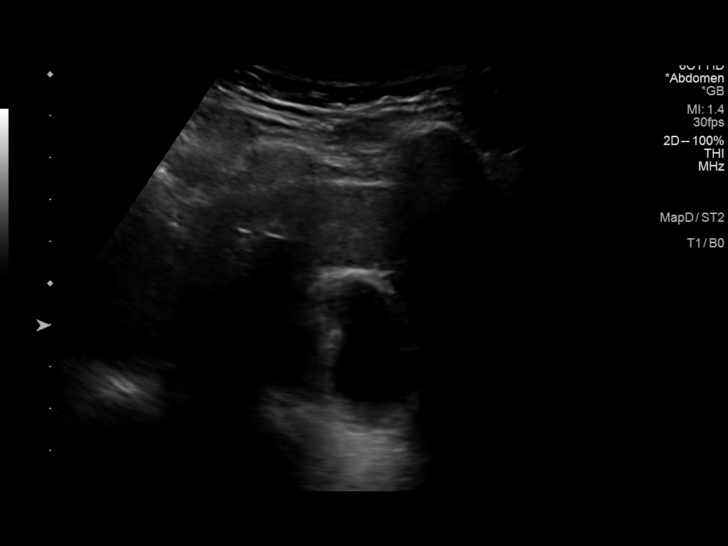
[im 13/49]
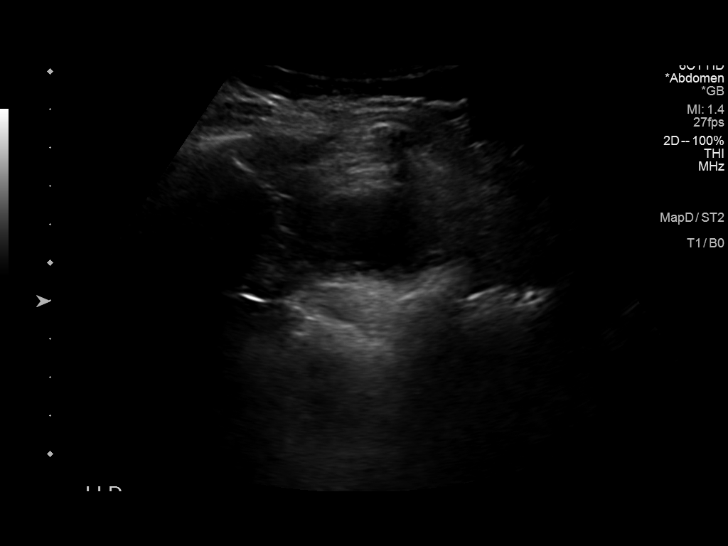
[im 17/49]
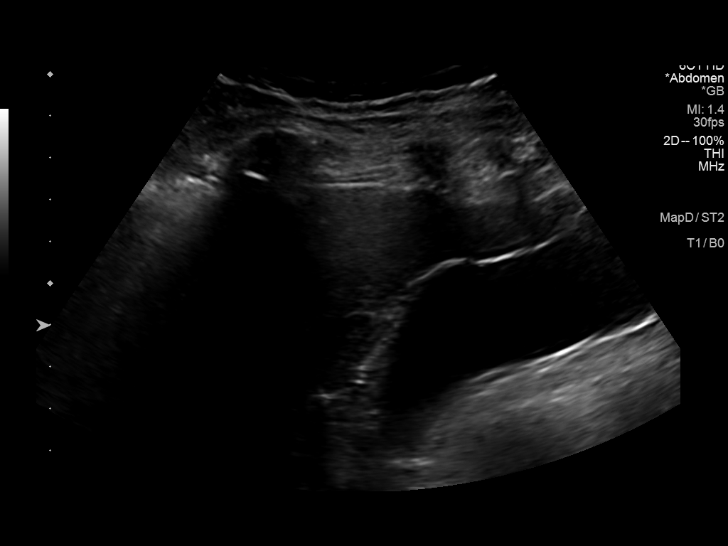
[im 19/49]
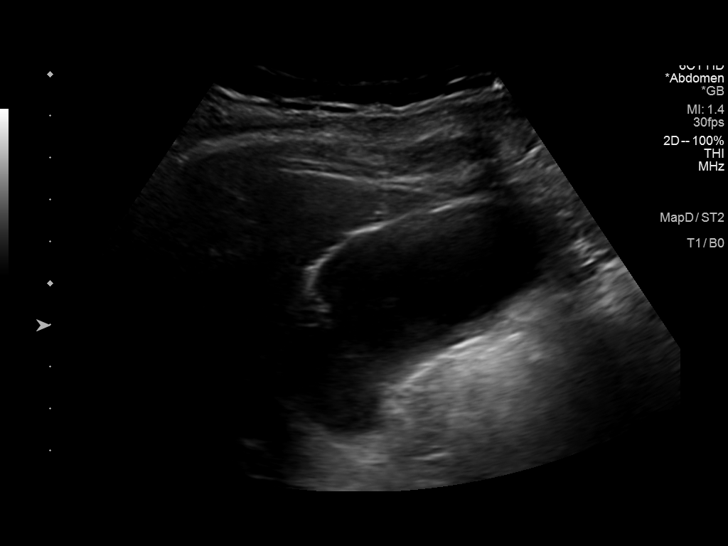
[im 23/49]
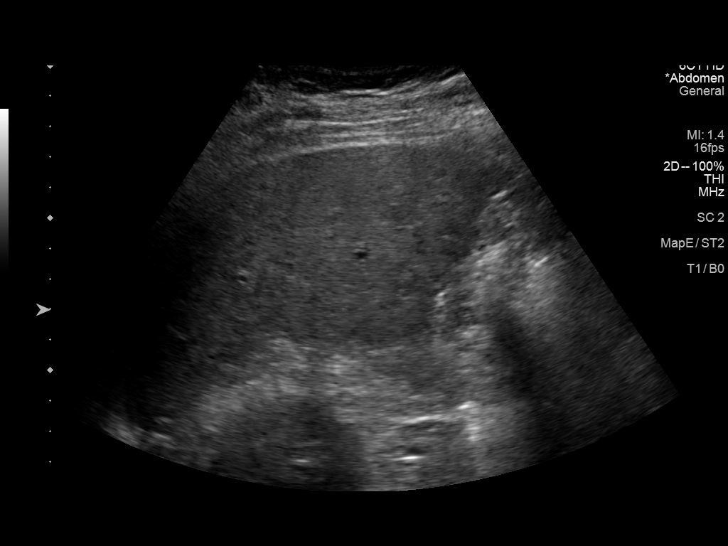
[im 27/49]
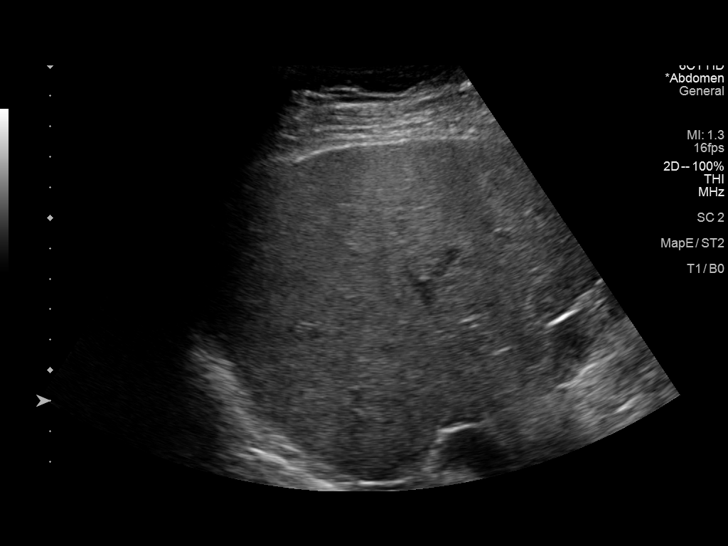
[im 31/49]
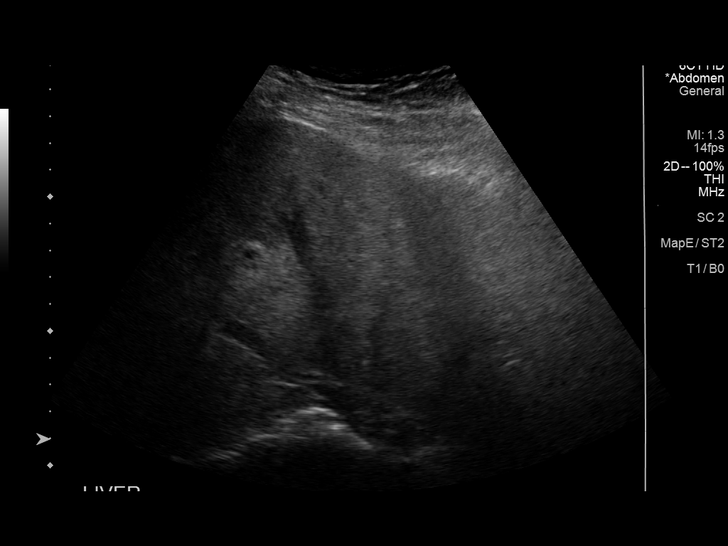
[im 33/49]
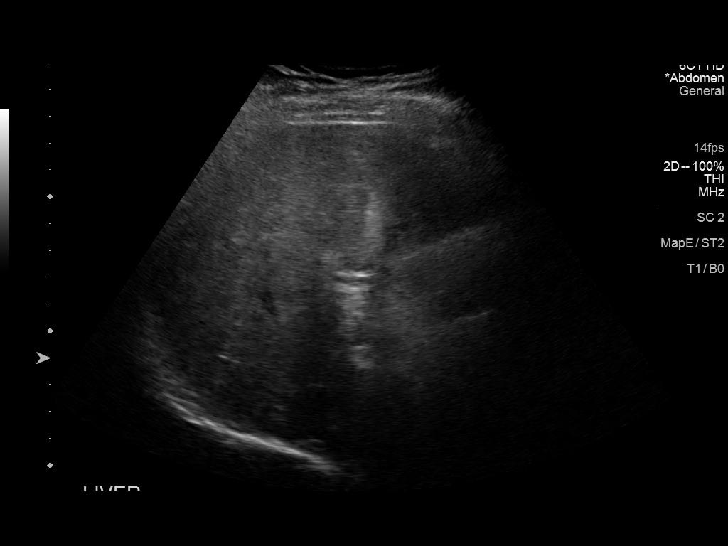
[im 37/49]
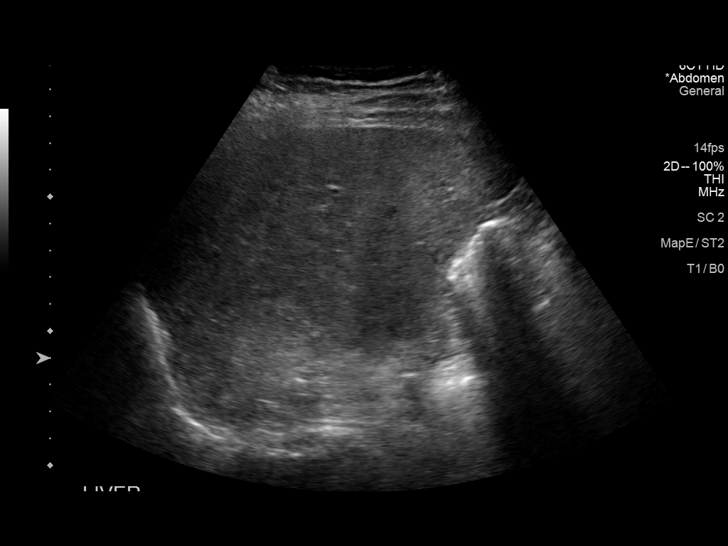
[im 41/49]
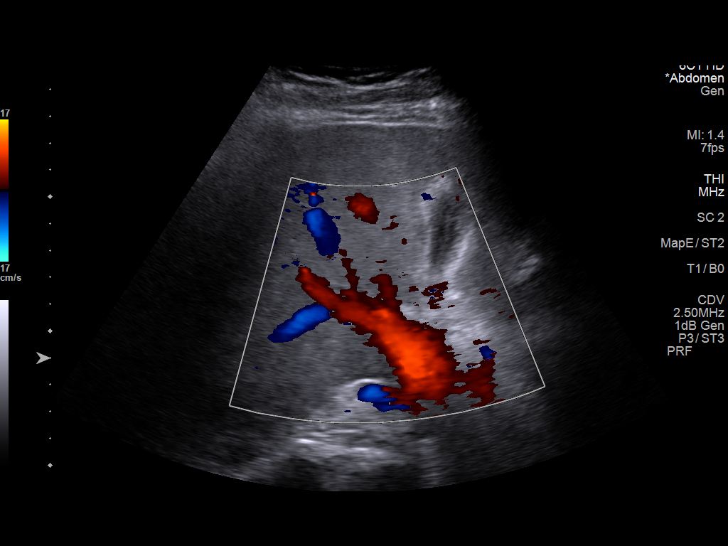
[im 45/49]
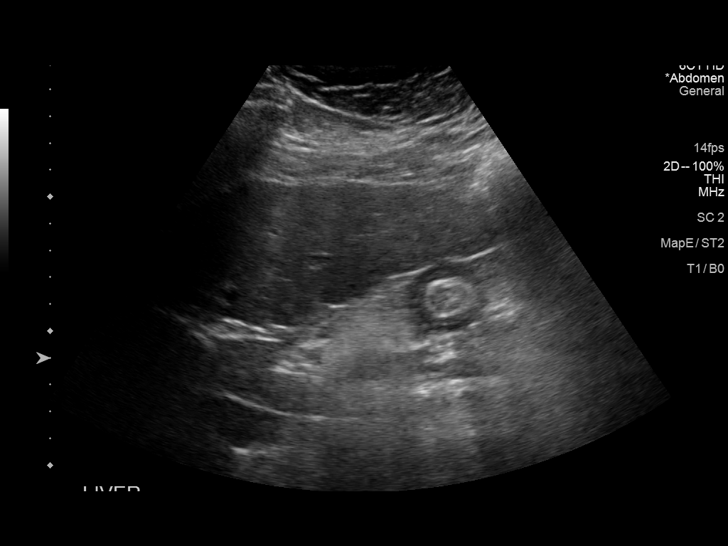
[im 49/49]
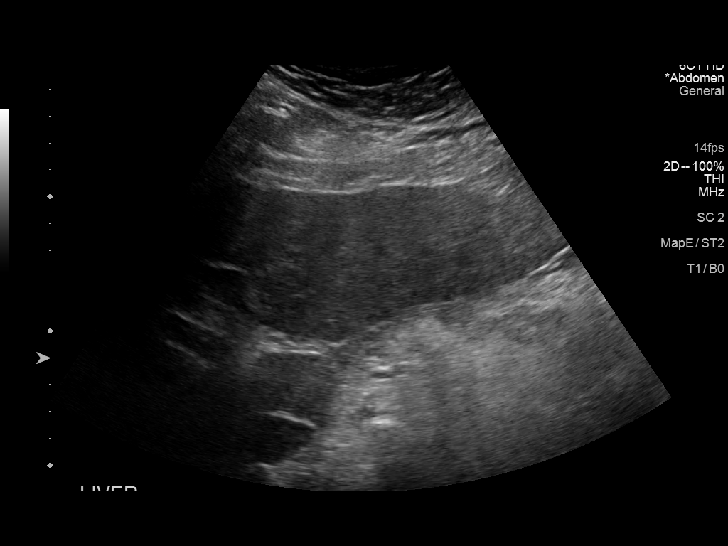

[14 of 25 positions shown; findings below may reference images not displayed]

FINDINGS: Gallbladder:

Physiologically distended. No gallstones or wall thickening
visualized. No sonographic Murphy sign noted by sonographer.

Common bile duct:

Diameter: 2-3 mm, normal

Liver:

Heterogeneous and increased parenchymal echogenicity. Select
contours are nodular. No focal lesion. Portal vein is patent on
color Doppler imaging with normal direction of blood flow towards
the liver.

Other: No right upper quadrant ascites.
IMPRESSION: 1. Sonographic features consistent with hepatic steatosis and
cirrhosis. No discrete focal lesion.
2. Normal sonographic appearance of the gallbladder and biliary
tree.

## 2023-03-20 ENCOUNTER — Ambulatory Visit: Payer: Medicare HMO | Admitting: Family Medicine

## 2023-03-20 ENCOUNTER — Encounter: Payer: Self-pay | Admitting: Family Medicine

## 2023-03-20 VITALS — BP 127/87 | HR 115 | Ht 71.0 in | Wt 252.6 lb

## 2023-03-20 DIAGNOSIS — E1169 Type 2 diabetes mellitus with other specified complication: Secondary | ICD-10-CM

## 2023-03-20 DIAGNOSIS — R000 Tachycardia, unspecified: Secondary | ICD-10-CM | POA: Diagnosis not present

## 2023-03-20 DIAGNOSIS — E785 Hyperlipidemia, unspecified: Secondary | ICD-10-CM

## 2023-03-20 DIAGNOSIS — E1159 Type 2 diabetes mellitus with other circulatory complications: Secondary | ICD-10-CM

## 2023-03-20 DIAGNOSIS — I152 Hypertension secondary to endocrine disorders: Secondary | ICD-10-CM

## 2023-03-20 NOTE — Assessment & Plan Note (Signed)
Has been persistently tachycardic.  Unfortunately, it was not rechecked today.  States he has been taking his Coreg as prescribed.  Has been in sinus tach in the past and placed on metoprolol at that time.  Consider pain as contributor.  Less likely anemia, hyperthyroidism, infection given chronicity and negative labs.  Follow-up at next visit and consider repeat EKG.

## 2023-03-20 NOTE — Progress Notes (Signed)
    SUBJECTIVE:   CHIEF COMPLAINT / HPI:   Hypertension follow-up Has been taking amlodipine, olmesartan, Coreg as prescribed.  Has stopped torsemide.  No longer feels dizzy/lightheaded.  Bilateral lower extremity edema has gotten a little worse since stopping torsemide.  He also has more pain in his legs, though he feels this is due more to his chronic back pain.  OBJECTIVE:   BP 127/87   Pulse (!) 115   Ht 5\' 11"  (1.803 m)   Wt 252 lb 9.6 oz (114.6 kg)   SpO2 98%   BMI 35.23 kg/m   General: Alert and oriented, in NAD Skin: Warm, dry, and intact without lesions HEENT: NCAT, EOM grossly normal, midline nasal septum Cardiac: RRR, no m/r/g appreciated Respiratory: CTAB, breathing and speaking comfortably on RA Abdominal: Soft, nontender, nondistended, normoactive bowel sounds Extremities: Moves all extremities grossly equally Neurological: No gross focal deficit Psychiatric: Appropriate mood and affect   ASSESSMENT/PLAN:   Hypertension associated with diabetes (HCC) Remains acceptable off torsemide.  Continue olmesartan, Coreg.  Discussed likely contribution of amlodipine to bilateral lower extremity edema; however, patient would like to continue on amlodipine at this time.  Can discuss therapy switch at future visits as desired.  Will obtain BMP at future lab visit given no phlebotomist in office today.  Hyperlipidemia associated with type 2 diabetes mellitus (HCC) Will obtain lipid panel at future lab visit given no phlebotomist in lab today.  Tachycardia Has been persistently tachycardic.  Unfortunately, it was not rechecked today.  States he has been taking his Coreg as prescribed.  Has been in sinus tach in the past and placed on metoprolol at that time.  Consider pain as contributor.  Less likely anemia, hyperthyroidism, infection given chronicity and negative labs.  Follow-up at next visit and consider repeat EKG.   Health maintenance Referred for Medicare annual wellness  visit.  Janeal Holmes, MD [facility]

## 2023-03-20 NOTE — Assessment & Plan Note (Addendum)
Remains acceptable off torsemide.  Continue olmesartan, Coreg.  Discussed likely contribution of amlodipine to bilateral lower extremity edema; however, patient would like to continue on amlodipine at this time.  Can discuss therapy switch at future visits as desired.  Will obtain BMP at future lab visit given no phlebotomist in office today.

## 2023-03-20 NOTE — Patient Instructions (Signed)
Your blood pressure looks great today. I think your leg swelling could be due to your amlodipine.  We can try different medicine as desired in the future. Make a lab appointment upfront for Thursday or Friday this week to get a repeat of your cholesterol and your electrolytes.

## 2023-03-20 NOTE — Assessment & Plan Note (Signed)
Will obtain lipid panel at future lab visit given no phlebotomist in lab today.

## 2023-03-27 ENCOUNTER — Other Ambulatory Visit: Payer: Self-pay | Admitting: Student

## 2023-03-27 ENCOUNTER — Other Ambulatory Visit: Payer: Medicare HMO

## 2023-03-27 DIAGNOSIS — E1159 Type 2 diabetes mellitus with other circulatory complications: Secondary | ICD-10-CM

## 2023-04-24 ENCOUNTER — Other Ambulatory Visit: Payer: Self-pay | Admitting: Student

## 2023-04-24 DIAGNOSIS — I152 Hypertension secondary to endocrine disorders: Secondary | ICD-10-CM

## 2023-04-24 MED ORDER — OLMESARTAN MEDOXOMIL 40 MG PO TABS: 40.0000 mg | ORAL_TABLET | Freq: Every day | ORAL | 3 refills | Status: AC

## 2023-04-24 MED ORDER — AMLODIPINE BESYLATE 2.5 MG PO TABS: 2.5000 mg | ORAL_TABLET | Freq: Every day | ORAL | 3 refills | Status: DC

## 2023-04-24 MED ORDER — CARVEDILOL 12.5 MG PO TABS: 12.5000 mg | ORAL_TABLET | Freq: Two times a day (BID) | ORAL | 3 refills | Status: AC

## 2023-04-24 NOTE — Progress Notes (Signed)
 Pt requested refill of Olmesartan-hydrochlorothiazide which was d/c at prior visit. Will refill current BP meds : Amlodipine, Olmesartan, Coreg, and decline combination pill.   Refill sent to pharmacy on [address]

## 2023-07-16 ENCOUNTER — Encounter: Payer: Self-pay | Admitting: Internal Medicine

## 2023-07-18 ENCOUNTER — Other Ambulatory Visit: Payer: Self-pay | Admitting: Family Medicine

## 2023-07-18 DIAGNOSIS — E119 Type 2 diabetes mellitus without complications: Secondary | ICD-10-CM

## 2023-07-20 ENCOUNTER — Ambulatory Visit (HOSPITAL_COMMUNITY)
Admission: RE | Admit: 2023-07-20 | Discharge: 2023-07-20 | Disposition: A | Discharge status: Home / Self Care | Source: Ambulatory Visit | Attending: Family Medicine | Admitting: Family Medicine

## 2023-07-20 ENCOUNTER — Ambulatory Visit (INDEPENDENT_AMBULATORY_CARE_PROVIDER_SITE_OTHER): Admitting: Student

## 2023-07-20 VITALS — BP 122/80 | HR 99 | Temp 97.8°F | Wt 237.4 lb

## 2023-07-20 DIAGNOSIS — M25571 Pain in right ankle and joints of right foot: Secondary | ICD-10-CM | POA: Diagnosis present

## 2023-07-20 DIAGNOSIS — M25579 Pain in unspecified ankle and joints of unspecified foot: Secondary | ICD-10-CM | POA: Insufficient documentation

## 2023-07-20 MED ORDER — NAPROXEN 500 MG PO TABS: 500.0000 mg | ORAL_TABLET | Freq: Two times a day (BID) | ORAL | 0 refills | Status: AC

## 2023-07-20 NOTE — Assessment & Plan Note (Addendum)
 7 days of acute ankle pain with swelling. Neurovascularly intact with normal gait ambulating independently. On exam, soft tissue swelling over lateral ankle inferior to lateral mallelous and over achilles tendon insertion Interestingly, no mechanism of injury to accompany the swelling on exam Would still obtain x-ray to rule out bony fracture/abnormality I suspect he could have a bone spur causing friction with shoewear leading to swelling No calf swelling/erythema to suggest DVT Consider gout (abnormal anatomic site), infection (such as osteomyelitis) Continue RICE Start Naproxen 500 mg BID F/u in 1 week

## 2023-07-20 NOTE — Patient Instructions (Addendum)
 It was great seeing you today.  As we discussed, -At this point, I am unclear of what is causing the swelling in your ankle but we will get an x-ray -Start taking naproxen twice daily with food for 7 days to help with your pain and inflammation -I suspect your symptoms will improve over time, continue to ice 15 minutes at a time as needed   If you have any questions or concerns, please feel free to call the clinic.   Have a wonderful day,  Dr. Darral Dash [facility] [phone]

## 2023-07-20 NOTE — Progress Notes (Addendum)
    SUBJECTIVE:   CHIEF COMPLAINT / HPI:   Right ankle pain Started 1 week ago Painful to walk, but getting better No injury, trauma No drainage, blood  He has tried doing epsom salts, soaks without much relief Rested, elevated for 2-3 days  PERTINENT  PMH / PSH:  HTN T2DM Hepatitis C with hepatic cirrhosis Hx health anxiety, depression  OBJECTIVE:   BP 122/80   Pulse 99   Temp 97.8 F (36.6 C)   Wt 237 lb 6.4 oz (107.7 kg)   SpO2 96%   BMI 33.11 kg/m   General: NAD, well appearing MSK, right ankle: Inspection yields no erythema, ulceration/drainage, or ecchymosis, but does have swelling over lateral ankle inferior to lateral malleolus and calcaneous. No obvious bony abnormalities. Some pain with inversion, but FROM with plantarflexion/dorsiflexion and inversion. Easily palpable distal pulses. Normal calf raise. Normal gait. Negative talar tilt.  ASSESSMENT/PLAN:   Ankle pain 7 days of acute ankle pain with swelling. Neurovascularly intact with normal gait ambulating independently. On exam, soft tissue swelling over lateral ankle inferior to lateral mallelous and over achilles tendon insertion Interestingly, no mechanism of injury to accompany the swelling on exam Would still obtain x-ray to rule out bony fracture/abnormality I suspect he could have a bone spur causing friction with shoewear leading to swelling No calf swelling/erythema to suggest DVT Consider gout (abnormal anatomic site), infection (such as osteomyelitis) Continue RICE Start Naproxen 500 mg BID F/u in 1 week     Darral Dash, DO [facility]

## 2023-08-02 ENCOUNTER — Encounter: Payer: Self-pay | Admitting: Student

## 2023-08-11 ENCOUNTER — Emergency Department (HOSPITAL_COMMUNITY)
Admission: EM | Admit: 2023-08-11 | Discharge: 2023-08-11 | Disposition: A | Discharge status: Home / Self Care | Attending: Emergency Medicine | Admitting: Emergency Medicine

## 2023-08-11 ENCOUNTER — Other Ambulatory Visit: Payer: Self-pay

## 2023-08-11 ENCOUNTER — Encounter (HOSPITAL_COMMUNITY): Payer: Self-pay | Admitting: *Deleted

## 2023-08-11 ENCOUNTER — Emergency Department (HOSPITAL_COMMUNITY)

## 2023-08-11 DIAGNOSIS — M545 Low back pain, unspecified: Secondary | ICD-10-CM | POA: Diagnosis not present

## 2023-08-11 DIAGNOSIS — K5900 Constipation, unspecified: Secondary | ICD-10-CM | POA: Insufficient documentation

## 2023-08-11 DIAGNOSIS — K429 Umbilical hernia without obstruction or gangrene: Secondary | ICD-10-CM | POA: Insufficient documentation

## 2023-08-11 DIAGNOSIS — I10 Essential (primary) hypertension: Secondary | ICD-10-CM | POA: Insufficient documentation

## 2023-08-11 DIAGNOSIS — Z79899 Other long term (current) drug therapy: Secondary | ICD-10-CM | POA: Diagnosis not present

## 2023-08-11 DIAGNOSIS — E1165 Type 2 diabetes mellitus with hyperglycemia: Secondary | ICD-10-CM | POA: Insufficient documentation

## 2023-08-11 DIAGNOSIS — M549 Dorsalgia, unspecified: Secondary | ICD-10-CM | POA: Diagnosis present

## 2023-08-11 LAB — CBC WITH DIFFERENTIAL/PLATELET
Abs Immature Granulocytes: 0.01 10*3/uL (ref 0.00–0.07)
Basophils Absolute: 0.1 10*3/uL (ref 0.0–0.1)
Basophils Relative: 2 %
Eosinophils Absolute: 0.4 10*3/uL (ref 0.0–0.5)
Eosinophils Relative: 7 %
HCT: 39.5 % (ref 39.0–52.0)
Hemoglobin: 14.4 g/dL (ref 13.0–17.0)
Immature Granulocytes: 0 %
Lymphocytes Relative: 43 %
Lymphs Abs: 2.4 10*3/uL (ref 0.7–4.0)
MCH: 32 pg (ref 26.0–34.0)
MCHC: 36.5 g/dL — ABNORMAL HIGH (ref 30.0–36.0)
MCV: 87.8 fL (ref 80.0–100.0)
Monocytes Absolute: 0.5 10*3/uL (ref 0.1–1.0)
Monocytes Relative: 10 %
Neutro Abs: 2 10*3/uL (ref 1.7–7.7)
Neutrophils Relative %: 38 %
Platelets: 238 10*3/uL (ref 150–400)
RBC: 4.5 MIL/uL (ref 4.22–5.81)
RDW: 12.4 % (ref 11.5–15.5)
WBC: 5.4 10*3/uL (ref 4.0–10.5)
nRBC: 0 % (ref 0.0–0.2)

## 2023-08-11 LAB — BASIC METABOLIC PANEL WITH GFR
Anion gap: 9 (ref 5–15)
BUN: 12 mg/dL (ref 8–23)
CO2: 23 mmol/L (ref 22–32)
Calcium: 9.5 mg/dL (ref 8.9–10.3)
Chloride: 106 mmol/L (ref 98–111)
Creatinine, Ser: 0.91 mg/dL (ref 0.61–1.24)
GFR, Estimated: >60 mL/min (ref >60)
Glucose, Bld: 133 mg/dL — ABNORMAL HIGH (ref 70–99)
Potassium: 3.8 mmol/L (ref 3.5–5.1)
Sodium: 138 mmol/L (ref 135–145)

## 2023-08-11 MED ORDER — METHOCARBAMOL 500 MG PO TABS: 500.0000 mg | ORAL_TABLET | Freq: Two times a day (BID) | ORAL | 0 refills | Status: DC | PRN

## 2023-08-11 MED ORDER — SODIUM CHLORIDE 0.9 % IV BOLUS
500.0000 mL | Freq: Once | INTRAVENOUS | Status: AC
  Administered 2023-08-11: 500 mL via INTRAVENOUS

## 2023-08-11 MED ORDER — IOHEXOL 350 MG/ML SOLN
75.0000 mL | Freq: Once | INTRAVENOUS | Status: AC | PRN
  Administered 2023-08-11: 75 mL via INTRAVENOUS

## 2023-08-11 NOTE — ED Provider Notes (Signed)
  EMERGENCY DEPARTMENT AT [facility] Provider Note   CSN: [id number] Arrival date & time: 08/11/23  1559     History {Add pertinent medical, surgical, social history, OB history to HPI:1} Chief Complaint  Patient presents with   Flank Pain    Cameron Diaz is a 63 y.o. male.  He has a history of hypertension diabetes and is on chronic narcotics and Linzess.  He said he has not had much for bowel movement over the last 3 weeks.  He does not take the Linzess all the time because it causes him to have diarrhea.  He is having worsening left flank and back pain and generalized abdominal pain.  Has decreased appetite.  Has tried some over-the-counter laxatives without improvement.  He said he saw his doctors at [facility] and had some imaging but does not know the results.  Symptoms are worsening so came here for further evaluation.  The history is provided by the patient.  Flank Pain This is a new problem. The current episode started more than 1 week ago. The problem occurs constantly. The problem has not changed since onset.Associated symptoms include abdominal pain. Pertinent negatives include no chest pain, no headaches and no shortness of breath. The symptoms are aggravated by bending, twisting and eating. Nothing relieves the symptoms. He has tried rest for the symptoms. The treatment provided no relief.       Home Medications Prior to Admission medications   Medication Sig Start Date End Date Taking? Authorizing Provider  amLODipine  (NORVASC ) 2.5 MG tablet Take 1 tablet (2.5 mg total) by mouth daily. 04/24/23   Sowell, Brandon, MD  benzonatate  (TESSALON ) 100 MG capsule Take 1 capsule by mouth every 8 (eight) hours for cough. 02/20/23   Afton Albright, MD  carvedilol  (COREG ) 12.5 MG tablet Take 1 tablet (12.5 mg total) by mouth 2 (two) times daily with a meal. 04/24/23   Wilhemena Harbour, MD  Continuous Glucose Sensor (FREESTYLE LIBRE SENSOR SYSTEM) MISC Apply to skin  01/02/23   McDiarmid, Demetra Filter, MD  diclofenac  Sodium (VOLTAREN ) 1 % GEL Apply 4 g topically See admin instructions. Apply 4 grams to entire lower back ("hip to hip") two times a day 03/13/23   Dema Filler, MD  escitalopram  (LEXAPRO ) 10 MG tablet Take 1 tablet (10 mg total) by mouth daily. 03/13/23   Dema Filler, MD  lidocaine  (LIDODERM ) 5 % Place 1 patch onto the skin daily. Remove & Discard patch within 12 hours or as directed by MD 03/13/23   Dema Filler, MD  LINZESS 72 MCG capsule Take 72 mcg by mouth daily. 11/04/22   [provider]  MOUNJARO  10 MG/0.5ML Pen INJECT 10 MG INTO THE SKIN ONCE A WEEK 07/18/23   Wilhemena Harbour, MD  olmesartan  (BENICAR ) 40 MG tablet Take 1 tablet (40 mg total) by mouth daily. 04/24/23   Wilhemena Harbour, MD  oxyCODONE -acetaminophen  (PERCOCET) 7.5-325 MG tablet Take 1 tablet by mouth every 6 (six) hours. 12/09/22   [provider]  Polyethyl Glycol-Propyl Glycol (SYSTANE ULTRA OP) Apply 1 drop to eye 3 (three) times daily.    [provider]  rosuvastatin  (CRESTOR ) 10 MG tablet Take 1 tablet (10 mg total) by mouth daily. 01/02/23   McDiarmid, Demetra Filter, MD  Sennosides (SENNA) 8.6 MG CAPS Take 1 capsule by mouth 2 (two) times daily. Patient not taking: Reported on 03/13/2023 08/04/22   Joelle Musca, MD  sildenafil  (REVATIO ) 20 MG tablet Take 1 tablet (20 mg total) by  mouth daily as needed. 02/03/22   Paige, Victoria J, DO  tadalafil (CIALIS) 5 MG tablet Take 5 mg by mouth daily as needed. 08/08/22   [provider]      Allergies    Patient has no known allergies.    Review of Systems   Review of Systems  Constitutional:  Negative for fever.  Respiratory:  Negative for shortness of breath.   Cardiovascular:  Negative for chest pain.  Gastrointestinal:  Positive for abdominal pain and constipation.  Genitourinary:  Positive for flank pain.  Musculoskeletal:  Positive for back pain.  Neurological:  Negative for headaches.    Physical  Exam Updated Vital Signs BP (!) 145/95 (BP Location: Right Arm)   Pulse 85   Temp 98.5 F (36.9 C)   Resp 13   Ht 5\' 11"  (1.803 m)   Wt 107.7 kg   SpO2 96%   BMI 33.12 kg/m  Physical Exam Vitals and nursing note reviewed.  Constitutional:      General: He is not in acute distress.    Appearance: He is well-developed.  HENT:     Head: Normocephalic and atraumatic.  Eyes:     Conjunctiva/sclera: Conjunctivae normal.  Cardiovascular:     Rate and Rhythm: Normal rate and regular rhythm.     Heart sounds: No murmur heard. Pulmonary:     Effort: Pulmonary effort is normal. No respiratory distress.     Breath sounds: Normal breath sounds.  Abdominal:     Palpations: Abdomen is soft.     Tenderness: There is abdominal tenderness. There is no guarding or rebound.  Musculoskeletal:        General: No deformity.     Cervical back: Neck supple.  Skin:    General: Skin is warm and dry.     Capillary Refill: Capillary refill takes less than 2 seconds.  Neurological:     General: No focal deficit present.     Mental Status: He is alert.     Sensory: No sensory deficit.     Motor: No weakness.     ED Results / Procedures / Treatments   Labs (all labs ordered are listed, but only abnormal results are displayed) Labs Reviewed - No data to display  EKG None  Radiology No results found.  Procedures Procedures  {Document cardiac monitor, telemetry assessment procedure when appropriate:1}  Medications Ordered in ED Medications  sodium chloride  0.9 % bolus 500 mL (has no administration in time range)    ED Course/ Medical Decision Making/ A&P   {   Click here for ABCD2, HEART and other calculatorsREFRESH Note before signing :1}                              Medical Decision Making Amount and/or Complexity of Data Reviewed Labs: ordered. Radiology: ordered.   This patient complains of ***; this involves an extensive number of treatment Options and is a complaint  that carries with it a high risk of complications and morbidity. The differential includes ***  I ordered, reviewed and interpreted labs, which included *** I ordered medication *** and reviewed PMP when indicated. I ordered imaging studies which included *** and I independently    visualized and interpreted imaging which showed *** Additional history obtained from *** Previous records obtained and reviewed *** I consulted *** and discussed lab and imaging findings and discussed disposition.  Cardiac monitoring reviewed, *** Social determinants considered, ***  Critical Interventions: ***  After the interventions stated above, I reevaluated the patient and found *** Admission and further testing considered, ***   {Document critical care time when appropriate:1} {Document review of labs and clinical decision tools ie heart score, Chads2Vasc2 etc:1}  {Document your independent review of radiology images, and any outside records:1} {Document your discussion with family members, caretakers, and with consultants:1} {Document social determinants of health affecting pt's care:1} {Document your decision making why or why not admission, treatments were needed:1} Final Clinical Impression(s) / ED Diagnoses Final diagnoses:  None    Rx / DC Orders ED Discharge Orders     None

## 2023-08-11 NOTE — Discharge Instructions (Addendum)
 You were seen in the emergency department for left-sided back pain and concerns for constipation.  You had lab work and a CAT scan of your abdomen and pelvis that did not show any signs of significant constipation or other acute findings to explain your symptoms.  We are prescribing a muscle relaxant for your back.  Please contact your primary care doctor for close follow-up.  Return to the emergency department if any worsening or concerning symptoms.

## 2023-08-11 NOTE — ED Triage Notes (Signed)
 THE PT HAS A PAIN  IN HIS LT FLANK  PAIN FOR 2 WEEKS HE IS ON PAIN MANAGEMENT  HE HAS A HISTORY OF CONSTIPATION  HE LAST HAD A BM THIS AM

## 2023-08-28 ENCOUNTER — Ambulatory Visit: Admitting: Student

## 2023-08-29 ENCOUNTER — Ambulatory Visit (INDEPENDENT_AMBULATORY_CARE_PROVIDER_SITE_OTHER): Admitting: Family Medicine

## 2023-08-29 VITALS — BP 135/94 | HR 98 | Ht 71.0 in | Wt 233.4 lb

## 2023-08-29 DIAGNOSIS — G894 Chronic pain syndrome: Secondary | ICD-10-CM | POA: Diagnosis not present

## 2023-08-29 DIAGNOSIS — M5442 Lumbago with sciatica, left side: Secondary | ICD-10-CM

## 2023-08-29 DIAGNOSIS — M5441 Lumbago with sciatica, right side: Secondary | ICD-10-CM | POA: Diagnosis not present

## 2023-08-29 DIAGNOSIS — E1159 Type 2 diabetes mellitus with other circulatory complications: Secondary | ICD-10-CM | POA: Diagnosis not present

## 2023-08-29 DIAGNOSIS — I152 Hypertension secondary to endocrine disorders: Secondary | ICD-10-CM

## 2023-08-29 MED ORDER — FLUTICASONE PROPIONATE 50 MCG/ACT NA SUSP: 2.0000 | Freq: Every day | NASAL | 0 refills | Status: AC

## 2023-08-29 MED ORDER — DICLOFENAC SODIUM 1 % EX GEL: 4.0000 g | CUTANEOUS | 0 refills | Status: AC

## 2023-08-29 MED ORDER — MELOXICAM 15 MG PO TABS: 15.0000 mg | ORAL_TABLET | Freq: Every day | ORAL | 0 refills | Status: AC

## 2023-08-29 MED ORDER — LIDOCAINE 5 % EX PTCH: 1.0000 | MEDICATED_PATCH | CUTANEOUS | 0 refills | Status: AC

## 2023-08-29 NOTE — Patient Instructions (Signed)
 I have sent in mobic  15 mg to be taken daily for your back. I have also sent in a referral to physical therapy. Let me know if you do not hear back from them.  Be sure to send me a list of all the medications you are taking when you get home with dosing and how often you take them. This will help me care for you the best.

## 2023-08-29 NOTE — Progress Notes (Signed)
    SUBJECTIVE:   CHIEF COMPLAINT / HPI:   ED follow up, left side pain, back pain 2 month history. Slept on a new sofa at his family's house that alleviated the pain but when he came back home he started again. Has been using lidocaine  patches, voltaren  gel, percocet, and robaxin  which have helped a bit. No difficulty or pain with urination. Has been constipated in the past and felt this could be contributing, but he has been taking his linzess with results. He has recently lifted 5 pound bags of materials recently, but he does not feel this is due to that. He continues to have sciatica.  No saddle anesthesia.  No weakness or numbness of the bilateral lower extremities.  No bowel or bladder incontinence.  PERTINENT  PMH / PSH: Chronic back pain on chronic Percocet and gabapentin , hepatitis C, hepatic cirrhosis, HLD, type 2 diabetes, hypertension  OBJECTIVE:   BP (!) 135/94   Pulse 98   Ht 5\' 11"  (1.803 m)   Wt 233 lb 6.4 oz (105.9 kg)   SpO2 97%   BMI 32.55 kg/m   General: Alert and oriented, in NAD Skin: Warm, dry, and intact HEENT: NCAT, EOM grossly normal, midline nasal septum Cardiac: RRR, no m/r/g appreciated Respiratory/Back: CTAB, breathing and speaking comfortably on RA, tenderness over left>right paraspinal muscles to palpation, straight leg raise negative Extremities: Moves all extremities grossly equally Neurological: No gross focal deficit, bilateral lower extremity strength and sensation intact, gait normal Psychiatric: Appropriate mood and affect  ASSESSMENT/PLAN:   Assessment & Plan Acute left-sided low back pain with bilateral sciatica Acute on chronic back pain possibly in the setting of recent lifting of materials.  Reassuringly without neurologic signs or symptoms.  No contraindications to NSAIDs; sent in Mobic  course for 7 days.  Refilled Voltaren  gel though advised to hold off until finished Mobic  course.  Also refilled lidocaine  patches.  He obtains Percocet  through pain management.  Will also send in referral to PT.  Follow-up if not improved. Hypertension associated with diabetes (HCC) Uncontrolled today.  He is unsure which medications he is taking.  When he gets home, he will write down the list of all his medications and let me know on MyChart.  Discussed returning in 1 week for BP recheck.   Genetta Kenning, MD [facility]

## 2023-08-29 NOTE — Assessment & Plan Note (Signed)
 Uncontrolled today.  He is unsure which medications he is taking.  When he gets home, he will write down the list of all his medications and let me know on MyChart.  Discussed returning in 1 week for BP recheck.

## 2023-09-09 ENCOUNTER — Other Ambulatory Visit: Payer: Self-pay | Admitting: Student

## 2023-09-09 DIAGNOSIS — E119 Type 2 diabetes mellitus without complications: Secondary | ICD-10-CM

## 2023-09-17 ENCOUNTER — Ambulatory Visit: Admitting: Student

## 2023-09-17 NOTE — Patient Instructions (Incomplete)
 It was great to see you! Thank you for allowing me to participate in your care!  I recommend that you always bring your medications to each appointment as this makes it easy to ensure we are on the correct medications and helps us  not miss when refills are needed.  Our plans for today:  - High Blood Pressure Continue: Olmesartan  40 mg, amlodipine  2.5 mg, coreg  12.5 mg - Diabetes Continue: Mounjaro  10 mg qwk  We are checking some labs today, I will call you if they are abnormal will send you a MyChart message or a letter if they are normal.  If you do not hear about your labs in the next 2 weeks please let us  know.***  Take care and seek immediate care sooner if you develop any concerns.   Dr. Wilhemena Harbour, MD [facility]

## 2023-09-17 NOTE — Progress Notes (Deleted)
  SUBJECTIVE:   CHIEF COMPLAINT / HPI:   F/u HTN Meds:Olmesartan  40 mg, amlodipine  2.5 mg, coreg  12.5 mg  DM Meds: Mounjaro  10 mg qwk  PERTINENT  PMH / PSH: ***  OBJECTIVE:  There were no vitals taken for this visit. ***  ASSESSMENT/PLAN:   Assessment & Plan  No follow-ups on file. Wilhemena Harbour, MD 09/17/2023, 1:30 PM PGY-***, [facility] {    This will disappear when note is signed, click to select method of visit    :1}

## 2023-09-18 ENCOUNTER — Encounter: Payer: Self-pay | Admitting: *Deleted

## 2023-10-23 ENCOUNTER — Other Ambulatory Visit: Payer: Self-pay | Admitting: Family Medicine

## 2023-10-23 DIAGNOSIS — F39 Unspecified mood [affective] disorder: Secondary | ICD-10-CM

## 2023-12-11 ENCOUNTER — Other Ambulatory Visit: Payer: Self-pay | Admitting: *Deleted

## 2023-12-11 DIAGNOSIS — E1159 Type 2 diabetes mellitus with other circulatory complications: Secondary | ICD-10-CM

## 2023-12-12 MED ORDER — AMLODIPINE BESYLATE 2.5 MG PO TABS: 2.5000 mg | ORAL_TABLET | Freq: Every day | ORAL | 3 refills | Status: AC

## 2023-12-26 ENCOUNTER — Other Ambulatory Visit: Payer: Self-pay | Admitting: Family Medicine

## 2024-01-08 ENCOUNTER — Other Ambulatory Visit: Payer: Self-pay

## 2024-01-08 DIAGNOSIS — E119 Type 2 diabetes mellitus without complications: Secondary | ICD-10-CM

## 2024-01-08 MED ORDER — MOUNJARO 10 MG/0.5ML ~~LOC~~ SOAJ: 10.0000 mg | SUBCUTANEOUS | 0 refills | Status: DC

## 2024-01-15 ENCOUNTER — Other Ambulatory Visit: Payer: Self-pay | Admitting: *Deleted

## 2024-01-15 DIAGNOSIS — F39 Unspecified mood [affective] disorder: Secondary | ICD-10-CM

## 2024-01-16 MED ORDER — ESCITALOPRAM OXALATE 10 MG PO TABS: 10.0000 mg | ORAL_TABLET | Freq: Every day | ORAL | 2 refills | Status: DC

## 2024-02-28 ENCOUNTER — Ambulatory Visit

## 2024-02-28 VITALS — BP 138/87 | HR 86 | Ht 72.0 in | Wt 227.4 lb

## 2024-02-28 VITALS — Ht 70.0 in | Wt 220.0 lb

## 2024-02-28 DIAGNOSIS — E119 Type 2 diabetes mellitus without complications: Secondary | ICD-10-CM

## 2024-02-28 DIAGNOSIS — G894 Chronic pain syndrome: Secondary | ICD-10-CM

## 2024-02-28 DIAGNOSIS — R5383 Other fatigue: Secondary | ICD-10-CM | POA: Diagnosis not present

## 2024-02-28 DIAGNOSIS — I152 Hypertension secondary to endocrine disorders: Secondary | ICD-10-CM

## 2024-02-28 DIAGNOSIS — M7540 Impingement syndrome of unspecified shoulder: Secondary | ICD-10-CM

## 2024-02-28 DIAGNOSIS — K635 Polyp of colon: Secondary | ICD-10-CM | POA: Diagnosis not present

## 2024-02-28 DIAGNOSIS — Z0000 Encounter for general adult medical examination without abnormal findings: Secondary | ICD-10-CM | POA: Diagnosis not present

## 2024-02-28 DIAGNOSIS — E1159 Type 2 diabetes mellitus with other circulatory complications: Secondary | ICD-10-CM

## 2024-02-28 LAB — POCT GLYCOSYLATED HEMOGLOBIN (HGB A1C): HbA1c, POC (controlled diabetic range): 5.8 % (ref 0.0–7.0)

## 2024-02-28 MED ORDER — LIDOCAINE 5 % EX PTCH: 1.0000 | MEDICATED_PATCH | CUTANEOUS | 0 refills | Status: DC

## 2024-02-28 MED ORDER — DICLOFENAC SODIUM 1 % EX GEL: 2.0000 g | Freq: Four times a day (QID) | CUTANEOUS | 1 refills | Status: AC | PRN
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
## 2024-02-28 NOTE — Patient Instructions (Addendum)
 It was wonderful to see you today.  Please bring ALL of your medications with you to every visit.   Today we talked about:  Sent in your diclofenac  gel and lidocaine  patches to the pharmacy.  Sent in referrals to PT for your shoulders and Dr. Koval for your medications and GI for a colonoscopy. Please watch for these phone calls.   Blood work today.   Thank you for choosing [facility].   Please call [phone] with any questions about today's appointment.  Please arrive at least 15 minutes prior to your scheduled appointments.   If you had blood work today, I will send you a MyChart message or a letter if results are normal. Otherwise, I will give you a call.   If you had a referral placed, they will call you to set up an appointment. Please give us  a call if you don't hear back in the next 2 weeks.   If you need additional refills before your next appointment, please call your pharmacy first.   You should follow up in our clinic in No follow-ups on file.  Camie Dixons, DO Family Medicine

## 2024-02-28 NOTE — Progress Notes (Signed)
    SUBJECTIVE:   CHIEF COMPLAINT / HPI:   Cameron Diaz is a 63 year old male who presents to the [facility] with primary complaint of low energy levels x 2 years.  Patient states he wakes up tired every day and is somnolent throughout the day.  He has been worked up for this prior in 2018 with a sleep study that was negative and routine lab work that was unremarkable.  The patient also complains of bilateral shoulder pain and states it has been going on for about 2 months since he started a new workout regimen at home.  He sees Dr. Norman Larve for pain management which involves Percocet, lidocaine  patches, and Voltaren  gel. Patient admits he does not have the greatest sleep hygiene, he usually watches TV and stays up till midnight or 1 AM in bed not particularly doing anything and then goes to sleep. Patient also endorses elevated systolic blood pressures at home and 170s to 190s.  He thinks his arm cuff may need recalibration.  He denies any vision changes, dizziness, lightheadedness, presyncope, nausea, vomiting, chest pain or any other complaints at this time.  PERTINENT  PMH / PSH: HTN, HLD, chronic pain, erectile dysfunction, T2DM  OBJECTIVE:   BP 138/87   Pulse 86   Ht 6' (1.829 m)   Wt 227 lb 6.4 oz (103.1 kg)   SpO2 95%   BMI 30.84 kg/m   General: A&O, NAD HEENT: No sign of trauma, EOM grossly intact Cardiac: RRR, no m/r/g Respiratory: CTAB, normal WOB, no w/c/r GI: Soft, NTTP, non-distended  Extremities: Positive Hawkins, empty can, and Neer's test bilaterally, no sensation deficits Neuro: Normal gait, moves all four extremities appropriately. Psych: Appropriate mood and affect   ASSESSMENT/PLAN:   Assessment & Plan Fatigue, unspecified type In total continuation for about 7 years now.  Most likely a combination of polypharmacy, poor sleep hygiene, life stress, and chronic pain.  Will begin workup for underlying cause. - Labs: CBC with differential, CMP with bicarb, lipid panel,  thyroid  studies, PSA - Consider sleep study as patient has been told previously he snores by his partner however last sleep study in 2018 was negative -Follow-up with me in 2 months Impingement syndrome of shoulder region, unspecified laterality New since beginning home workouts with body weight 2 months ago.  Most likely rotator cuff tear/impingement syndrome with positive Hawkins and Neer's tests. -Referral to physical therapy sent -Continue pain regimen as per pain management -Slow down on workouts and focus on physical therapy until better Hypertension associated with diabetes (HCC) Elevated systolics at home most likely secondary to irregularly working BP cuff.  I would like him to be seen by Dr. Koval for 24-hour BP check and medication management as patient is not exactly sure which medications he is taking them PDMP is different from patient recollection. -Appointment with Dr. Koval for med check and hypertension Polyp of colon, unspecified part of colon, unspecified type History of polyps, due for colonoscopy.  Patient states he thinks he does have a colonoscopy appointment set up before the end of the year however cannot remember the date and it is not written down in his phone. -Referral to GI sent for follow-up colonoscopy -Advised patient to call and let me know if he finds the appointment for start his colonoscopy before hand   Camie Dixons, DO [facility]

## 2024-02-28 NOTE — Patient Instructions (Signed)
 Mr. Bunton,  Thank you for taking the time for your Medicare Wellness Visit. I appreciate your continued commitment to your health goals. Please review the care plan we discussed, and feel free to reach out if I can assist you further.  Please note that Annual Wellness Visits do not include a physical exam. Some assessments may be limited, especially if the visit was conducted virtually. If needed, we may recommend an in-person follow-up with your provider.  Ongoing Care Seeing your primary care provider every 3 to 6 months helps us  monitor your health and provide consistent, personalized care.   Referrals If a referral was made during today's visit and you haven't received any updates within two weeks, please contact the referred provider directly to check on the status.  Recommended Screenings:  Health Maintenance  Topic Date Due   Medicare Annual Wellness Visit  Never done   Yearly kidney health urinalysis for diabetes  Never done   Zoster (Shingles) Vaccine (1 of 2) Never done   Complete foot exam   11/19/2019   Hepatitis B Vaccine (1 of 3 - Risk 3-dose series) Never done   Eye exam for diabetics  08/16/2021   Colon Cancer Screening  06/22/2023   Hemoglobin A1C  07/02/2023   Flu Shot  11/09/2023   COVID-19 Vaccine (4 - 2025-26 season) 12/10/2023   Yearly kidney function blood test for diabetes  08/10/2024   DTaP/Tdap/Td vaccine (2 - Td or Tdap) 02/08/2026   Pneumococcal Vaccine for age over 26  Completed   Hepatitis C Screening  Completed   HIV Screening  Completed   HPV Vaccine  Aged Out   Meningitis B Vaccine  Aged Out       08/11/2023    4:27 PM  Advanced Directives  Does Patient Have a Medical Advance Directive? No    Vision: Annual vision screenings are recommended for early detection of glaucoma, cataracts, and diabetic retinopathy. These exams can also reveal signs of chronic conditions such as diabetes and high blood pressure.  Dental: Annual dental screenings help  detect early signs of oral cancer, gum disease, and other conditions linked to overall health, including heart disease and diabetes.  Please see the attached documents for additional preventive care recommendations.

## 2024-02-29 ENCOUNTER — Telehealth: Payer: Self-pay

## 2024-02-29 ENCOUNTER — Other Ambulatory Visit: Payer: Self-pay

## 2024-02-29 DIAGNOSIS — E119 Type 2 diabetes mellitus without complications: Secondary | ICD-10-CM

## 2024-02-29 LAB — CBC WITH DIFFERENTIAL/PLATELET
Basophils Absolute: 0.1 x10E3/uL (ref 0.0–0.2)
Basos: 1 %
EOS (ABSOLUTE): 0.3 x10E3/uL (ref 0.0–0.4)
Eos: 6 %
Hematocrit: 42.6 % (ref 37.5–51.0)
Hemoglobin: 14.7 g/dL (ref 13.0–17.7)
Immature Grans (Abs): 0 x10E3/uL (ref 0.0–0.1)
Immature Granulocytes: 0 %
Lymphocytes Absolute: 1.8 x10E3/uL (ref 0.7–3.1)
Lymphs: 39 %
MCH: 33.2 pg — ABNORMAL HIGH (ref 26.6–33.0)
MCHC: 34.5 g/dL (ref 31.5–35.7)
MCV: 96 fL (ref 79–97)
Monocytes Absolute: 0.4 x10E3/uL (ref 0.1–0.9)
Monocytes: 8 %
Neutrophils Absolute: 2.1 x10E3/uL (ref 1.4–7.0)
Neutrophils: 46 %
Platelets: 212 x10E3/uL (ref 150–450)
RBC: 4.43 x10E6/uL (ref 4.14–5.80)
RDW: 13.3 % (ref 11.6–15.4)
WBC: 4.6 x10E3/uL (ref 3.4–10.8)

## 2024-02-29 LAB — TSH: TSH: 0.532 u[IU]/mL (ref 0.450–4.500)

## 2024-02-29 LAB — LIPID PANEL
Chol/HDL Ratio: 1.9 ratio (ref 0.0–5.0)
Cholesterol, Total: 135 mg/dL (ref 100–199)
HDL: 70 mg/dL (ref >39)
LDL Chol Calc (NIH): 49 mg/dL (ref 0–99)
Triglycerides: 84 mg/dL (ref 0–149)
VLDL Cholesterol Cal: 16 mg/dL (ref 5–40)

## 2024-02-29 LAB — COMPREHENSIVE METABOLIC PANEL WITH GFR
ALT: 17 IU/L (ref 0–44)
AST: 22 IU/L (ref 0–40)
Albumin: 4.7 g/dL (ref 3.9–4.9)
Alkaline Phosphatase: 70 IU/L (ref 47–123)
BUN/Creatinine Ratio: 15 (ref 10–24)
BUN: 14 mg/dL (ref 8–27)
Bilirubin Total: 0.6 mg/dL (ref 0.0–1.2)
CO2: 22 mmol/L (ref 20–29)
Calcium: 9.9 mg/dL (ref 8.6–10.2)
Chloride: 102 mmol/L (ref 96–106)
Creatinine, Ser: 0.92 mg/dL (ref 0.76–1.27)
Globulin, Total: 2.7 g/dL (ref 1.5–4.5)
Glucose: 155 mg/dL — ABNORMAL HIGH (ref 70–99)
Potassium: 3.7 mmol/L (ref 3.5–5.2)
Sodium: 138 mmol/L (ref 134–144)
Total Protein: 7.4 g/dL (ref 6.0–8.5)
eGFR: 93 mL/min/1.73 (ref >59)

## 2024-02-29 LAB — T4, FREE: Free T4: 1.32 ng/dL (ref 0.82–1.77)

## 2024-02-29 LAB — PSA: Prostate Specific Ag, Serum: 3.1 ng/mL (ref 0.0–4.0)

## 2024-02-29 NOTE — Telephone Encounter (Signed)
 Awaiting chart notes to close

## 2024-03-02 NOTE — Assessment & Plan Note (Signed)
 Elevated systolics at home most likely secondary to irregularly working BP cuff.  I would like him to be seen by Dr. Koval for 24-hour BP check and medication management as patient is not exactly sure which medications he is taking them PDMP is different from patient recollection. -Appointment with Dr. Koval for med check and hypertension

## 2024-03-03 ENCOUNTER — Other Ambulatory Visit (HOSPITAL_COMMUNITY): Payer: Self-pay

## 2024-03-03 ENCOUNTER — Telehealth: Payer: Self-pay

## 2024-03-03 NOTE — Telephone Encounter (Signed)
 Prior authorization submitted for LIDOCAINE  5% PATCHES to [employer] via Latent.   Key: [id number]

## 2024-03-03 NOTE — Telephone Encounter (Signed)
 Pharmacy Patient Advocate Encounter   Received notification from Patient Pharmacy that prior authorization for MOUNJARO  10MG  is required/requested.   Insurance verification completed.   The patient is insured through [employer].   PA required; PA started via CoverMyMeds. KEY [id number] . Waiting for clinical questions to populate.

## 2024-03-03 NOTE — Telephone Encounter (Signed)
 Prior authorization submitted for MOUNJARO  10MG  to [employer] via Latent.   Key: [id number]

## 2024-03-04 NOTE — Telephone Encounter (Signed)
 Pharmacy Patient Advocate Encounter  Received notification from [employer] that Prior Authorization for LIDOCAINE  5% PATCHES has been APPROVED from 03/03/24 to 04/09/25   PA #/Case ID/Reference #: [id number]

## 2024-03-04 NOTE — Telephone Encounter (Signed)
 Pharmacy Patient Advocate Encounter  Received notification from [employer] that Prior Authorization for MOUNJARO  10MG  has been APPROVED from 03/03/24 to 04/09/24   PA #/Case ID/Reference #: [id number]

## 2024-03-13 ENCOUNTER — Telehealth: Payer: Self-pay | Admitting: Pharmacist

## 2024-03-13 NOTE — Telephone Encounter (Signed)
 Attempted to contact patient for follow-up of need to reschedule upcoming appointment.    Left HIPAA compliant voice mail requesting call back to direct phone: [phone]  Total time with patient call and documentation of interaction: 4 minutes.

## 2024-03-17 ENCOUNTER — Telehealth: Payer: Self-pay | Admitting: Pharmacist

## 2024-03-17 NOTE — Telephone Encounter (Signed)
 Second attempted to contact patient for follow-up of need to reschedule.    Left HIPAA compliant voice mail requesting call back to reschedule: [phone]  Total time with patient call and documentation of interaction: 7 minutes.

## 2024-03-18 NOTE — Telephone Encounter (Signed)
 Patient returns call to nurse line.   Patient rescheduled for 12/18.

## 2024-03-25 ENCOUNTER — Ambulatory Visit: Admitting: Pharmacist

## 2024-03-27 ENCOUNTER — Ambulatory Visit: Admitting: Pharmacist

## 2024-03-28 ENCOUNTER — Ambulatory Visit: Payer: Self-pay

## 2024-03-31 ENCOUNTER — Telehealth: Payer: Self-pay | Admitting: Pharmacist

## 2024-03-31 NOTE — Telephone Encounter (Signed)
 Attempted to contact patient for follow-up of missed appointment and request to reschedule for missed appointments last week with both me and Dr. Lupie.   Left HIPAA compliant voice mail requesting call back to  Main number to reschedule: [phone]  If patient willing to only reschedule 1 visit, please prioritize visit with Dr. Lupie if she has availability.   Total time with patient call and documentation of interaction: 7 minutes.

## 2024-04-05 ENCOUNTER — Other Ambulatory Visit: Payer: Self-pay

## 2024-04-05 DIAGNOSIS — F39 Unspecified mood [affective] disorder: Secondary | ICD-10-CM

## 2024-04-08 ENCOUNTER — Other Ambulatory Visit: Payer: Self-pay

## 2024-05-16 ENCOUNTER — Encounter: Payer: Self-pay | Admitting: Internal Medicine
